# Patient Record
Sex: Male | Born: 1935
Health system: Southern US, Community
[De-identification: ages and names within clinical notes are randomized; demographics above are authoritative.]

## PROBLEM LIST (undated history)

## (undated) DIAGNOSIS — F325 Major depressive disorder, single episode, in full remission: Secondary | ICD-10-CM

## (undated) DIAGNOSIS — F329 Major depressive disorder, single episode, unspecified: Secondary | ICD-10-CM

## (undated) DIAGNOSIS — I10 Essential (primary) hypertension: Secondary | ICD-10-CM

## (undated) DIAGNOSIS — G251 Drug-induced tremor: Secondary | ICD-10-CM

## (undated) DIAGNOSIS — M1990 Unspecified osteoarthritis, unspecified site: Secondary | ICD-10-CM

## (undated) DIAGNOSIS — F32A Depression, unspecified: Secondary | ICD-10-CM

## (undated) DIAGNOSIS — Z87442 Personal history of urinary calculi: Secondary | ICD-10-CM

## (undated) DIAGNOSIS — G629 Polyneuropathy, unspecified: Secondary | ICD-10-CM

## (undated) DIAGNOSIS — Z9889 Other specified postprocedural states: Secondary | ICD-10-CM

## (undated) DIAGNOSIS — N400 Enlarged prostate without lower urinary tract symptoms: Secondary | ICD-10-CM

## (undated) HISTORY — PX: SINUS IRRIGATION: SHX2411

## (undated) HISTORY — DX: Major depressive disorder, single episode, unspecified: F32.9

## (undated) HISTORY — DX: Other specified postprocedural states: Z98.890

## (undated) HISTORY — DX: Polyneuropathy, unspecified: G62.9

## (undated) HISTORY — DX: Drug-induced tremor: G25.1

## (undated) HISTORY — DX: Major depressive disorder, single episode, in full remission: F32.5

## (undated) HISTORY — DX: Depression, unspecified: F32.A

## (undated) HISTORY — DX: Benign prostatic hyperplasia without lower urinary tract symptoms: N40.0

---

## 2006-02-20 ENCOUNTER — Encounter: Admission: RE | Admit: 2006-02-20 | Discharge: 2006-02-20 | Payer: Self-pay | Admitting: Geriatric Medicine

## 2009-01-05 ENCOUNTER — Encounter: Admission: RE | Admit: 2009-01-05 | Discharge: 2009-01-05 | Payer: Self-pay | Admitting: Neurology

## 2011-07-03 DIAGNOSIS — R972 Elevated prostate specific antigen [PSA]: Secondary | ICD-10-CM

## 2011-07-03 DIAGNOSIS — N400 Enlarged prostate without lower urinary tract symptoms: Secondary | ICD-10-CM | POA: Diagnosis not present

## 2011-07-03 DIAGNOSIS — N320 Bladder-neck obstruction: Secondary | ICD-10-CM | POA: Diagnosis not present

## 2011-07-03 DIAGNOSIS — N529 Male erectile dysfunction, unspecified: Secondary | ICD-10-CM | POA: Diagnosis not present

## 2011-07-03 HISTORY — DX: Elevated prostate specific antigen (PSA): R97.20

## 2011-08-03 DIAGNOSIS — R269 Unspecified abnormalities of gait and mobility: Secondary | ICD-10-CM | POA: Diagnosis not present

## 2011-08-03 DIAGNOSIS — D518 Other vitamin B12 deficiency anemias: Secondary | ICD-10-CM | POA: Diagnosis not present

## 2011-08-03 DIAGNOSIS — R413 Other amnesia: Secondary | ICD-10-CM | POA: Diagnosis not present

## 2011-08-16 DIAGNOSIS — F321 Major depressive disorder, single episode, moderate: Secondary | ICD-10-CM | POA: Diagnosis not present

## 2011-11-17 DIAGNOSIS — Z79899 Other long term (current) drug therapy: Secondary | ICD-10-CM | POA: Diagnosis not present

## 2011-11-17 DIAGNOSIS — I10 Essential (primary) hypertension: Secondary | ICD-10-CM | POA: Diagnosis not present

## 2011-11-17 DIAGNOSIS — E7800 Pure hypercholesterolemia, unspecified: Secondary | ICD-10-CM | POA: Diagnosis not present

## 2011-11-17 DIAGNOSIS — Z0000 Encounter for general adult medical examination without abnormal findings: Secondary | ICD-10-CM | POA: Diagnosis not present

## 2011-11-21 DIAGNOSIS — E7800 Pure hypercholesterolemia, unspecified: Secondary | ICD-10-CM | POA: Diagnosis not present

## 2011-11-21 DIAGNOSIS — Z79899 Other long term (current) drug therapy: Secondary | ICD-10-CM | POA: Diagnosis not present

## 2011-11-21 DIAGNOSIS — I10 Essential (primary) hypertension: Secondary | ICD-10-CM | POA: Diagnosis not present

## 2012-01-02 DIAGNOSIS — F339 Major depressive disorder, recurrent, unspecified: Secondary | ICD-10-CM | POA: Diagnosis not present

## 2012-01-02 DIAGNOSIS — Z79899 Other long term (current) drug therapy: Secondary | ICD-10-CM | POA: Diagnosis not present

## 2012-01-02 DIAGNOSIS — I10 Essential (primary) hypertension: Secondary | ICD-10-CM | POA: Diagnosis not present

## 2012-01-19 DIAGNOSIS — H2510 Age-related nuclear cataract, unspecified eye: Secondary | ICD-10-CM | POA: Diagnosis not present

## 2012-01-19 DIAGNOSIS — IMO0002 Reserved for concepts with insufficient information to code with codable children: Secondary | ICD-10-CM | POA: Insufficient documentation

## 2012-01-19 DIAGNOSIS — H35379 Puckering of macula, unspecified eye: Secondary | ICD-10-CM | POA: Diagnosis not present

## 2012-01-31 DIAGNOSIS — F321 Major depressive disorder, single episode, moderate: Secondary | ICD-10-CM | POA: Diagnosis not present

## 2012-02-01 DIAGNOSIS — I10 Essential (primary) hypertension: Secondary | ICD-10-CM | POA: Diagnosis not present

## 2012-02-01 DIAGNOSIS — L989 Disorder of the skin and subcutaneous tissue, unspecified: Secondary | ICD-10-CM | POA: Diagnosis not present

## 2012-02-01 DIAGNOSIS — Z79899 Other long term (current) drug therapy: Secondary | ICD-10-CM | POA: Diagnosis not present

## 2012-02-14 DIAGNOSIS — D230 Other benign neoplasm of skin of lip: Secondary | ICD-10-CM | POA: Diagnosis not present

## 2012-03-18 DIAGNOSIS — K1329 Other disturbances of oral epithelium, including tongue: Secondary | ICD-10-CM | POA: Diagnosis not present

## 2012-03-25 DIAGNOSIS — K1329 Other disturbances of oral epithelium, including tongue: Secondary | ICD-10-CM | POA: Diagnosis not present

## 2012-04-22 DIAGNOSIS — L570 Actinic keratosis: Secondary | ICD-10-CM | POA: Diagnosis not present

## 2012-04-22 DIAGNOSIS — D485 Neoplasm of uncertain behavior of skin: Secondary | ICD-10-CM | POA: Diagnosis not present

## 2012-07-01 DIAGNOSIS — N400 Enlarged prostate without lower urinary tract symptoms: Secondary | ICD-10-CM | POA: Diagnosis not present

## 2012-07-01 DIAGNOSIS — R972 Elevated prostate specific antigen [PSA]: Secondary | ICD-10-CM | POA: Diagnosis not present

## 2012-07-01 DIAGNOSIS — N320 Bladder-neck obstruction: Secondary | ICD-10-CM | POA: Diagnosis not present

## 2012-07-01 DIAGNOSIS — N529 Male erectile dysfunction, unspecified: Secondary | ICD-10-CM | POA: Diagnosis not present

## 2012-07-17 DIAGNOSIS — F321 Major depressive disorder, single episode, moderate: Secondary | ICD-10-CM | POA: Diagnosis not present

## 2012-07-19 DIAGNOSIS — F332 Major depressive disorder, recurrent severe without psychotic features: Secondary | ICD-10-CM | POA: Diagnosis not present

## 2012-08-01 DIAGNOSIS — R300 Dysuria: Secondary | ICD-10-CM | POA: Diagnosis not present

## 2012-08-01 DIAGNOSIS — I10 Essential (primary) hypertension: Secondary | ICD-10-CM | POA: Diagnosis not present

## 2012-08-01 DIAGNOSIS — E669 Obesity, unspecified: Secondary | ICD-10-CM | POA: Diagnosis not present

## 2012-08-15 DIAGNOSIS — K1329 Other disturbances of oral epithelium, including tongue: Secondary | ICD-10-CM | POA: Diagnosis not present

## 2012-09-30 DIAGNOSIS — K121 Other forms of stomatitis: Secondary | ICD-10-CM | POA: Diagnosis not present

## 2012-10-21 DIAGNOSIS — L570 Actinic keratosis: Secondary | ICD-10-CM | POA: Diagnosis not present

## 2012-10-21 DIAGNOSIS — D485 Neoplasm of uncertain behavior of skin: Secondary | ICD-10-CM | POA: Diagnosis not present

## 2012-10-31 DIAGNOSIS — I10 Essential (primary) hypertension: Secondary | ICD-10-CM | POA: Diagnosis not present

## 2012-12-30 DIAGNOSIS — R972 Elevated prostate specific antigen [PSA]: Secondary | ICD-10-CM | POA: Diagnosis not present

## 2012-12-30 DIAGNOSIS — N529 Male erectile dysfunction, unspecified: Secondary | ICD-10-CM | POA: Diagnosis not present

## 2012-12-30 DIAGNOSIS — N400 Enlarged prostate without lower urinary tract symptoms: Secondary | ICD-10-CM | POA: Diagnosis not present

## 2013-01-01 DIAGNOSIS — F321 Major depressive disorder, single episode, moderate: Secondary | ICD-10-CM | POA: Diagnosis not present

## 2013-02-06 DIAGNOSIS — E7800 Pure hypercholesterolemia, unspecified: Secondary | ICD-10-CM | POA: Diagnosis not present

## 2013-02-06 DIAGNOSIS — I10 Essential (primary) hypertension: Secondary | ICD-10-CM | POA: Diagnosis not present

## 2013-02-06 DIAGNOSIS — Z0000 Encounter for general adult medical examination without abnormal findings: Secondary | ICD-10-CM | POA: Diagnosis not present

## 2013-02-06 DIAGNOSIS — Z79899 Other long term (current) drug therapy: Secondary | ICD-10-CM | POA: Diagnosis not present

## 2013-02-06 DIAGNOSIS — Z1331 Encounter for screening for depression: Secondary | ICD-10-CM | POA: Diagnosis not present

## 2013-03-03 DIAGNOSIS — I10 Essential (primary) hypertension: Secondary | ICD-10-CM | POA: Diagnosis not present

## 2013-03-03 DIAGNOSIS — Z79899 Other long term (current) drug therapy: Secondary | ICD-10-CM | POA: Diagnosis not present

## 2013-03-03 DIAGNOSIS — E7800 Pure hypercholesterolemia, unspecified: Secondary | ICD-10-CM | POA: Diagnosis not present

## 2013-04-28 DIAGNOSIS — Z23 Encounter for immunization: Secondary | ICD-10-CM | POA: Diagnosis not present

## 2013-04-30 DIAGNOSIS — H2510 Age-related nuclear cataract, unspecified eye: Secondary | ICD-10-CM | POA: Diagnosis not present

## 2013-04-30 DIAGNOSIS — H35379 Puckering of macula, unspecified eye: Secondary | ICD-10-CM | POA: Diagnosis not present

## 2013-06-23 DIAGNOSIS — F321 Major depressive disorder, single episode, moderate: Secondary | ICD-10-CM | POA: Diagnosis not present

## 2013-06-30 DIAGNOSIS — R972 Elevated prostate specific antigen [PSA]: Secondary | ICD-10-CM | POA: Diagnosis not present

## 2013-06-30 DIAGNOSIS — N400 Enlarged prostate without lower urinary tract symptoms: Secondary | ICD-10-CM | POA: Diagnosis not present

## 2013-07-10 DIAGNOSIS — Z1211 Encounter for screening for malignant neoplasm of colon: Secondary | ICD-10-CM | POA: Diagnosis not present

## 2013-07-10 DIAGNOSIS — D126 Benign neoplasm of colon, unspecified: Secondary | ICD-10-CM | POA: Diagnosis not present

## 2013-07-10 DIAGNOSIS — K5730 Diverticulosis of large intestine without perforation or abscess without bleeding: Secondary | ICD-10-CM | POA: Diagnosis not present

## 2013-08-07 DIAGNOSIS — Z23 Encounter for immunization: Secondary | ICD-10-CM | POA: Diagnosis not present

## 2013-08-07 DIAGNOSIS — Z79899 Other long term (current) drug therapy: Secondary | ICD-10-CM | POA: Diagnosis not present

## 2013-08-07 DIAGNOSIS — I10 Essential (primary) hypertension: Secondary | ICD-10-CM | POA: Diagnosis not present

## 2013-12-08 DIAGNOSIS — F321 Major depressive disorder, single episode, moderate: Secondary | ICD-10-CM | POA: Diagnosis not present

## 2013-12-22 DIAGNOSIS — N529 Male erectile dysfunction, unspecified: Secondary | ICD-10-CM | POA: Diagnosis not present

## 2013-12-22 DIAGNOSIS — N400 Enlarged prostate without lower urinary tract symptoms: Secondary | ICD-10-CM | POA: Diagnosis not present

## 2014-02-12 DIAGNOSIS — I10 Essential (primary) hypertension: Secondary | ICD-10-CM | POA: Diagnosis not present

## 2014-02-12 DIAGNOSIS — Z6837 Body mass index (BMI) 37.0-37.9, adult: Secondary | ICD-10-CM | POA: Diagnosis not present

## 2014-02-12 DIAGNOSIS — Z1331 Encounter for screening for depression: Secondary | ICD-10-CM | POA: Diagnosis not present

## 2014-02-12 DIAGNOSIS — Z23 Encounter for immunization: Secondary | ICD-10-CM | POA: Diagnosis not present

## 2014-02-12 DIAGNOSIS — Z79899 Other long term (current) drug therapy: Secondary | ICD-10-CM | POA: Diagnosis not present

## 2014-02-12 DIAGNOSIS — Z0000 Encounter for general adult medical examination without abnormal findings: Secondary | ICD-10-CM | POA: Diagnosis not present

## 2014-02-12 DIAGNOSIS — E669 Obesity, unspecified: Secondary | ICD-10-CM | POA: Diagnosis not present

## 2014-02-19 DIAGNOSIS — Z79899 Other long term (current) drug therapy: Secondary | ICD-10-CM | POA: Diagnosis not present

## 2014-02-19 DIAGNOSIS — I10 Essential (primary) hypertension: Secondary | ICD-10-CM | POA: Diagnosis not present

## 2014-05-25 DIAGNOSIS — F321 Major depressive disorder, single episode, moderate: Secondary | ICD-10-CM | POA: Diagnosis not present

## 2014-08-13 DIAGNOSIS — I10 Essential (primary) hypertension: Secondary | ICD-10-CM | POA: Diagnosis not present

## 2014-08-13 DIAGNOSIS — Z79899 Other long term (current) drug therapy: Secondary | ICD-10-CM | POA: Diagnosis not present

## 2014-08-13 DIAGNOSIS — E669 Obesity, unspecified: Secondary | ICD-10-CM | POA: Diagnosis not present

## 2014-08-13 DIAGNOSIS — Z6837 Body mass index (BMI) 37.0-37.9, adult: Secondary | ICD-10-CM | POA: Diagnosis not present

## 2014-11-09 DIAGNOSIS — F321 Major depressive disorder, single episode, moderate: Secondary | ICD-10-CM | POA: Diagnosis not present

## 2014-12-21 DIAGNOSIS — R3915 Urgency of urination: Secondary | ICD-10-CM | POA: Diagnosis not present

## 2014-12-21 DIAGNOSIS — R972 Elevated prostate specific antigen [PSA]: Secondary | ICD-10-CM | POA: Diagnosis not present

## 2014-12-21 DIAGNOSIS — N401 Enlarged prostate with lower urinary tract symptoms: Secondary | ICD-10-CM

## 2014-12-21 DIAGNOSIS — R350 Frequency of micturition: Secondary | ICD-10-CM | POA: Diagnosis not present

## 2014-12-21 DIAGNOSIS — N528 Other male erectile dysfunction: Secondary | ICD-10-CM | POA: Diagnosis not present

## 2014-12-21 HISTORY — DX: Benign prostatic hyperplasia with lower urinary tract symptoms: N40.1

## 2015-01-27 DIAGNOSIS — H35373 Puckering of macula, bilateral: Secondary | ICD-10-CM | POA: Diagnosis not present

## 2015-01-27 DIAGNOSIS — H52203 Unspecified astigmatism, bilateral: Secondary | ICD-10-CM | POA: Diagnosis not present

## 2015-01-27 DIAGNOSIS — H2513 Age-related nuclear cataract, bilateral: Secondary | ICD-10-CM | POA: Diagnosis not present

## 2015-01-27 DIAGNOSIS — H5213 Myopia, bilateral: Secondary | ICD-10-CM | POA: Diagnosis not present

## 2015-01-28 ENCOUNTER — Ambulatory Visit
Admission: RE | Admit: 2015-01-28 | Discharge: 2015-01-28 | Disposition: A | Discharge status: Home / Self Care | Payer: Medicare Other | Source: Ambulatory Visit | Attending: Internal Medicine | Admitting: Internal Medicine

## 2015-01-28 ENCOUNTER — Other Ambulatory Visit: Payer: Self-pay | Admitting: Internal Medicine

## 2015-01-28 ENCOUNTER — Ambulatory Visit (HOSPITAL_COMMUNITY)
Admission: EM | Admit: 2015-01-28 | Discharge: 2015-01-29 | Disposition: A | Discharge status: Home / Self Care | Payer: Medicare Other | Attending: Urology | Admitting: Urology

## 2015-01-28 ENCOUNTER — Encounter (HOSPITAL_COMMUNITY)
Admission: EM | Disposition: A | Discharge status: Home / Self Care | Payer: Self-pay | Source: Home / Self Care | Attending: Emergency Medicine

## 2015-01-28 ENCOUNTER — Emergency Department (HOSPITAL_COMMUNITY): Payer: Medicare Other

## 2015-01-28 ENCOUNTER — Emergency Department (HOSPITAL_COMMUNITY): Payer: Medicare Other | Admitting: Certified Registered"

## 2015-01-28 ENCOUNTER — Encounter (HOSPITAL_COMMUNITY): Payer: Self-pay | Admitting: *Deleted

## 2015-01-28 DIAGNOSIS — N200 Calculus of kidney: Secondary | ICD-10-CM

## 2015-01-28 DIAGNOSIS — N400 Enlarged prostate without lower urinary tract symptoms: Secondary | ICD-10-CM | POA: Diagnosis not present

## 2015-01-28 DIAGNOSIS — R109 Unspecified abdominal pain: Secondary | ICD-10-CM | POA: Diagnosis not present

## 2015-01-28 DIAGNOSIS — Z79899 Other long term (current) drug therapy: Secondary | ICD-10-CM | POA: Insufficient documentation

## 2015-01-28 DIAGNOSIS — X58XXXA Exposure to other specified factors, initial encounter: Secondary | ICD-10-CM | POA: Diagnosis not present

## 2015-01-28 DIAGNOSIS — N201 Calculus of ureter: Secondary | ICD-10-CM | POA: Diagnosis present

## 2015-01-28 DIAGNOSIS — K760 Fatty (change of) liver, not elsewhere classified: Secondary | ICD-10-CM | POA: Diagnosis not present

## 2015-01-28 DIAGNOSIS — K5900 Constipation, unspecified: Secondary | ICD-10-CM | POA: Insufficient documentation

## 2015-01-28 DIAGNOSIS — S3710XA Unspecified injury of ureter, initial encounter: Secondary | ICD-10-CM | POA: Diagnosis not present

## 2015-01-28 DIAGNOSIS — R944 Abnormal results of kidney function studies: Secondary | ICD-10-CM | POA: Insufficient documentation

## 2015-01-28 DIAGNOSIS — S3719XA Other injury of ureter, initial encounter: Secondary | ICD-10-CM | POA: Insufficient documentation

## 2015-01-28 DIAGNOSIS — R1032 Left lower quadrant pain: Secondary | ICD-10-CM | POA: Diagnosis not present

## 2015-01-28 DIAGNOSIS — N3289 Other specified disorders of bladder: Secondary | ICD-10-CM | POA: Diagnosis not present

## 2015-01-28 DIAGNOSIS — I10 Essential (primary) hypertension: Secondary | ICD-10-CM | POA: Diagnosis not present

## 2015-01-28 HISTORY — DX: Calculus of ureter: N20.1

## 2015-01-28 HISTORY — DX: Essential (primary) hypertension: I10

## 2015-01-28 HISTORY — PX: CYSTOSCOPY WITH RETROGRADE PYELOGRAM, URETEROSCOPY AND STENT PLACEMENT: SHX5789

## 2015-01-28 LAB — COMPREHENSIVE METABOLIC PANEL
ALT: 28 U/L (ref 17–63)
AST: 23 U/L (ref 15–41)
Albumin: 4.5 g/dL (ref 3.5–5.0)
Alkaline Phosphatase: 55 U/L (ref 38–126)
Anion gap: 10 (ref 5–15)
BUN: 19 mg/dL (ref 6–20)
CALCIUM: 9.7 mg/dL (ref 8.9–10.3)
CO2: 24 mmol/L (ref 22–32)
CREATININE: 1.52 mg/dL — AB (ref 0.61–1.24)
Chloride: 103 mmol/L (ref 101–111)
GFR calc non Af Amer: 42 mL/min — ABNORMAL LOW (ref >60)
GFR, EST AFRICAN AMERICAN: 48 mL/min — AB (ref >60)
GLUCOSE: 126 mg/dL — AB (ref 65–99)
POTASSIUM: 4.8 mmol/L (ref 3.5–5.1)
SODIUM: 137 mmol/L (ref 135–145)
TOTAL PROTEIN: 7.3 g/dL (ref 6.5–8.1)
Total Bilirubin: 0.8 mg/dL (ref 0.3–1.2)

## 2015-01-28 LAB — CBC
HEMATOCRIT: 45.3 % (ref 39.0–52.0)
HEMOGLOBIN: 15.2 g/dL (ref 13.0–17.0)
MCH: 30 pg (ref 26.0–34.0)
MCHC: 33.6 g/dL (ref 30.0–36.0)
MCV: 89.5 fL (ref 78.0–100.0)
Platelets: 255 10*3/uL (ref 150–400)
RBC: 5.06 MIL/uL (ref 4.22–5.81)
RDW: 13.5 % (ref 11.5–15.5)
WBC: 12.8 10*3/uL — AB (ref 4.0–10.5)

## 2015-01-28 LAB — URINALYSIS, ROUTINE W REFLEX MICROSCOPIC
BILIRUBIN URINE: NEGATIVE
Glucose, UA: NEGATIVE mg/dL
KETONES UR: NEGATIVE mg/dL
LEUKOCYTES UA: NEGATIVE
NITRITE: NEGATIVE
PH: 7.5 (ref 5.0–8.0)
Protein, ur: NEGATIVE mg/dL
Specific Gravity, Urine: 1.01 (ref 1.005–1.030)
UROBILINOGEN UA: 0.2 mg/dL (ref 0.0–1.0)

## 2015-01-28 LAB — LIPASE, BLOOD: LIPASE: 29 U/L (ref 22–51)

## 2015-01-28 LAB — URINE MICROSCOPIC-ADD ON

## 2015-01-28 LAB — I-STAT CG4 LACTIC ACID, ED: LACTIC ACID, VENOUS: 1.5 mmol/L (ref 0.5–2.0)

## 2015-01-28 SURGERY — CYSTOURETEROSCOPY, WITH RETROGRADE PYELOGRAM AND STENT INSERTION
Anesthesia: General | Site: Ureter | Laterality: Left

## 2015-01-28 MED ORDER — CEFAZOLIN SODIUM-DEXTROSE 2-3 GM-% IV SOLR
INTRAVENOUS | Status: DC | PRN
  Administered 2015-01-28: 2 g via INTRAVENOUS

## 2015-01-28 MED ORDER — EPINEPHRINE HCL 0.1 MG/ML IJ SOSY
PREFILLED_SYRINGE | INTRAMUSCULAR | Status: DC | PRN
  Administered 2015-01-28: 10 ug via INTRAVENOUS

## 2015-01-28 MED ORDER — MORPHINE SULFATE 4 MG/ML IJ SOLN
4.0000 mg | Freq: Once | INTRAMUSCULAR | Status: AC
  Administered 2015-01-28: 4 mg via INTRAVENOUS
  Filled 2015-01-28: qty 1

## 2015-01-28 MED ORDER — TAMSULOSIN HCL 0.4 MG PO CAPS
0.4000 mg | ORAL_CAPSULE | Freq: Every day | ORAL | Status: DC
  Filled 2015-01-28: qty 1

## 2015-01-28 MED ORDER — FENTANYL CITRATE (PF) 100 MCG/2ML IJ SOLN
INTRAMUSCULAR | Status: DC | PRN
  Administered 2015-01-28 (×2): 50 ug via INTRAVENOUS

## 2015-01-28 MED ORDER — SUCCINYLCHOLINE CHLORIDE 20 MG/ML IJ SOLN
INTRAMUSCULAR | Status: DC | PRN
  Administered 2015-01-28: 100 mg via INTRAVENOUS

## 2015-01-28 MED ORDER — ACETAMINOPHEN 10 MG/ML IV SOLN
INTRAVENOUS | Status: DC | PRN
  Administered 2015-01-28: 1000 mg via INTRAVENOUS

## 2015-01-28 MED ORDER — IOHEXOL 300 MG/ML  SOLN
INTRAMUSCULAR | Status: DC | PRN
  Administered 2015-01-28: 1 mL via INTRAVENOUS

## 2015-01-28 MED ORDER — LACTATED RINGERS IV SOLN
INTRAVENOUS | Status: DC | PRN
  Administered 2015-01-28: 23:00:00 via INTRAVENOUS

## 2015-01-28 MED ORDER — FINASTERIDE 5 MG PO TABS
5.0000 mg | ORAL_TABLET | Freq: Every day | ORAL | Status: DC
  Filled 2015-01-28: qty 1

## 2015-01-28 MED ORDER — LIDOCAINE HCL (PF) 2 % IJ SOLN
INTRAMUSCULAR | Status: DC | PRN
  Administered 2015-01-28: 20 mg via INTRADERMAL

## 2015-01-28 MED ORDER — ONDANSETRON HCL 4 MG/2ML IJ SOLN
4.0000 mg | Freq: Once | INTRAMUSCULAR | Status: AC
  Administered 2015-01-28: 4 mg via INTRAVENOUS
  Filled 2015-01-28: qty 2

## 2015-01-28 MED ORDER — DEXAMETHASONE SODIUM PHOSPHATE 10 MG/ML IJ SOLN
INTRAMUSCULAR | Status: DC | PRN
  Administered 2015-01-28: 10 mg via INTRAVENOUS

## 2015-01-28 MED ORDER — SODIUM CHLORIDE 0.9 % IV BOLUS (SEPSIS): 1000.0000 mL | Freq: Once | INTRAVENOUS | Status: DC

## 2015-01-28 MED ORDER — METOCLOPRAMIDE HCL 5 MG/ML IJ SOLN
INTRAMUSCULAR | Status: DC | PRN
  Administered 2015-01-28: 10 mg via INTRAVENOUS

## 2015-01-28 MED ORDER — BELLADONNA ALKALOIDS-OPIUM 16.2-60 MG RE SUPP
RECTAL | Status: DC | PRN
  Administered 2015-01-28: 1 via RECTAL

## 2015-01-28 MED ORDER — PHENYLEPHRINE HCL 10 MG/ML IJ SOLN
INTRAMUSCULAR | Status: DC | PRN
  Administered 2015-01-28 (×3): 80 ug via INTRAVENOUS

## 2015-01-28 MED ORDER — DEXTROSE 5 % IV SOLN
10.0000 mg | INTRAVENOUS | Status: DC | PRN
  Administered 2015-01-28: 40 ug/min via INTRAVENOUS

## 2015-01-28 MED ORDER — EPHEDRINE SULFATE 50 MG/ML IJ SOLN
INTRAMUSCULAR | Status: DC | PRN
  Administered 2015-01-28 (×5): 10 mg via INTRAVENOUS

## 2015-01-28 MED ORDER — FENTANYL CITRATE (PF) 100 MCG/2ML IJ SOLN: 25.0000 ug | INTRAMUSCULAR | Status: DC | PRN

## 2015-01-28 MED ORDER — IOHEXOL 300 MG/ML  SOLN
25.0000 mL | INTRAMUSCULAR | Status: AC
  Administered 2015-01-28: 25 mL via ORAL

## 2015-01-28 MED ORDER — LACTATED RINGERS IV SOLN
INTRAVENOUS | Status: DC | PRN
  Administered 2015-01-28 (×2): via INTRAVENOUS

## 2015-01-28 MED ORDER — PROMETHAZINE HCL 25 MG/ML IJ SOLN: 6.2500 mg | INTRAMUSCULAR | Status: DC | PRN

## 2015-01-28 MED ORDER — IOHEXOL 300 MG/ML  SOLN
80.0000 mL | Freq: Once | INTRAMUSCULAR | Status: AC | PRN
  Administered 2015-01-28: 80 mL via INTRAVENOUS

## 2015-01-28 MED ORDER — PROPOFOL 10 MG/ML IV BOLUS
INTRAVENOUS | Status: DC | PRN
  Administered 2015-01-28: 150 mg via INTRAVENOUS

## 2015-01-28 MED ORDER — ONDANSETRON HCL 4 MG/2ML IJ SOLN
INTRAMUSCULAR | Status: DC | PRN
  Administered 2015-01-28: 4 mg via INTRAVENOUS

## 2015-01-28 SURGICAL SUPPLY — 24 items
BAG URINE DRAINAGE (UROLOGICAL SUPPLIES) ×2 IMPLANT
BAG URO CATCHER STRL LF (DRAPE) ×3 IMPLANT
BASKET ZERO TIP NITINOL 2.4FR (BASKET) ×2 IMPLANT
BSKT STON RTRVL ZERO TP 2.4FR (BASKET) ×2
CATH INTERMIT  6FR 70CM (CATHETERS) ×3 IMPLANT
CATH SILASTIC FOLEY 18FRX5CC (CATHETERS) ×2 IMPLANT
CLOTH BEACON ORANGE TIMEOUT ST (SAFETY) ×3 IMPLANT
COVER SURGICAL LIGHT HANDLE (MISCELLANEOUS) ×2 IMPLANT
FIBER LASER FLEXIVA 1000 (UROLOGICAL SUPPLIES) IMPLANT
FIBER LASER FLEXIVA 200 (UROLOGICAL SUPPLIES) IMPLANT
FIBER LASER FLEXIVA 365 (UROLOGICAL SUPPLIES) IMPLANT
FIBER LASER FLEXIVA 550 (UROLOGICAL SUPPLIES) IMPLANT
FIBER LASER TRAC TIP (UROLOGICAL SUPPLIES) IMPLANT
GLOVE BIOGEL M STRL SZ7.5 (GLOVE) ×3 IMPLANT
GOWN STRL REUS W/TWL XL LVL3 (GOWN DISPOSABLE) ×3 IMPLANT
GUIDEWIRE ANG ZIPWIRE 035X150 (WIRE) ×4 IMPLANT
GUIDEWIRE STR DUAL SENSOR (WIRE) ×3 IMPLANT
MANIFOLD NEPTUNE II (INSTRUMENTS) ×3 IMPLANT
NS IRRIG 1000ML POUR BTL (IV SOLUTION) ×3 IMPLANT
PACK CYSTO (CUSTOM PROCEDURE TRAY) ×3 IMPLANT
SCRUB PCMX 4 OZ (MISCELLANEOUS) ×3 IMPLANT
STENT URET 6FRX24 CONTOUR (STENTS) ×2 IMPLANT
TUBING CONNECTING 10 (TUBING) ×3 IMPLANT
WIRE COONS/BENSON .038X145CM (WIRE) ×2 IMPLANT

## 2015-01-28 NOTE — Interval H&P Note (Signed)
History and Physical Interval Note:  01/28/2015 11:07 PM  Chase Mathis  has presented today for surgery, with the diagnosis of left ureteral stone  The various methods of treatment have been discussed with the patient and family. After consideration of risks, benefits and other options for treatment, the patient has consented to  Procedure(s): CYSTOSCOPY WITH LEFT RETROGRADE PYELOGRAM, LEFT URETEROSCOPY/STONE BASKETING  AND LEFT URETERAL  STENT PLACEMENT (Left) HOLMIUM LASER APPLICATION (Left) as a surgical intervention .  The patient's history has been reviewed, patient examined, no change in status, stable for surgery.  I have reviewed the patient's chart and labs.  Questions were answered to the patient's satisfaction.     Chase Mathis I Dilyn Smiles

## 2015-01-28 NOTE — ED Provider Notes (Signed)
CSN: [id number]     Arrival date & time 01/28/15  1646 History   First MD Initiated Contact with Patient 01/28/15 1728     Chief Complaint  Patient presents with  . 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 The pain has been constant all day waxing and waning in severity. Nothing in particular makes it better or worse. He took a suppository and docolax for constipation but did not have any relief. Reports last normal bowel movement 2 days ago. Has had some small bowel movements since then. He denies passing any gas. He saw his PCP today and had an x-ray that showed ileus versus obstruction. He has no previous abdominal surgeries. His only medical problem is high blood pressure. He denies any previous kidney stones. No pain with urination or blood in the urine. Denies fever. Poor appetite today. He's had nausea but no vomiting. The pain is in his suprapubic area and is constant and does not radiate. No testicular pain.  The history is provided by the patient and a relative.    Past Medical History  Diagnosis Date  . Hypertension    History reviewed. No pertinent past surgical history. History reviewed. No pertinent family history. History  Substance Use Topics  . Smoking status: Never Smoker   . Smokeless tobacco: Not on file  . Alcohol Use: Yes     Comment: occasionally    Review of Systems  Constitutional: Positive for activity change and appetite change. Negative for fever and fatigue.  HENT: Negative for congestion and rhinorrhea.   Respiratory: Negative for cough, chest tightness and shortness of breath.   Cardiovascular: Negative for chest pain and leg swelling.  Gastrointestinal: Positive for nausea, abdominal pain and constipation. Negative for vomiting.  Genitourinary:  Negative for dysuria and hematuria.  Musculoskeletal: Negative for myalgias, arthralgias, neck pain and neck stiffness.  Skin: Negative for rash.  A complete 10 system review of systems was obtained and all systems are negative except as noted in the HPI and PMH.      Allergies  Review of patient's allergies indicates no known allergies.  Home Medications   Prior to Admission medications   Medication Sig Start Date End Date Taking? Authorizing Provider  amLODipine (NORVASC) 5 MG tablet Take 5 mg by mouth daily.   Yes Historical Provider, MD  cyanocobalamin (,VITAMIN B-12,) 1000 MCG/ML injection Inject 1,000 mcg into the muscle every 30 (thirty) days.   Yes Historical Provider, MD  lisinopril (PRINIVIL,ZESTRIL) 10 MG tablet Take 10 mg by mouth daily.   Yes Historical Provider, MD  lithium carbonate (ESKALITH) 450 MG CR tablet Take 450 mg by mouth at bedtime.   Yes Historical Provider, MD  nortriptyline (PAMELOR) 50 MG capsule Take 150 mg by mouth at bedtime.   Yes Historical Provider, MD  Stannous Fluoride (GEL-KAM) 0.4 % GEL Place 1-2 application onto teeth daily.   Yes Historical Provider, MD   BP 135/60 mmHg  Pulse 64  Temp(Src) 97.7 F (36.5 C) (Oral)  Resp 16  Ht 5\' 9"  (1.753 m)  Wt 220 lb (99.791 kg)  BMI 32.47 kg/m2  SpO2 98% Physical Exam  Constitutional: He is oriented to person, place, and time. He appears well-developed and well-nourished. No distress.  No distress, easily getting on and off stretcher.  HENT:  Head: Normocephalic and atraumatic.  Mouth/Throat: Oropharynx is  clear and moist. No oropharyngeal exudate.  Eyes: Conjunctivae and EOM are normal. Pupils are equal, round, and reactive to light.  Neck: Normal range of motion. Neck supple.  No meningismus.  Cardiovascular: Normal rate, regular rhythm, normal heart sounds and intact distal pulses.   No murmur heard. Pulmonary/Chest: Effort normal and breath sounds normal. No respiratory distress.  Abdominal:  Soft. He exhibits distension. There is tenderness. There is no rebound and no guarding.  Tenderness suprapubically without guarding or rebound. Abdomen is distended but no worse than usual by  report. Patient does have a ventral hernia evident when he sits up that is nontender.  Genitourinary:  No testicular tenderness. Patient states rectal exam performed at PCPs office was normal. He defers repeat exam.  Musculoskeletal: Normal range of motion. He exhibits no edema or tenderness.  Neurological: He is alert and oriented to person, place, and time. No cranial nerve deficit. He exhibits normal muscle tone. Coordination normal.  No ataxia on finger to nose bilaterally. No pronator drift. 5/5 strength throughout. CN 2-12 intact. Negative Romberg. Equal grip strength. Sensation intact. Gait is normal.   Skin: Skin is warm.  Psychiatric: He has a normal mood and affect. His behavior is normal.  Nursing note and vitals reviewed.   ED Course  Procedures (including critical care time) Labs Review Labs Reviewed  COMPREHENSIVE METABOLIC PANEL - Abnormal; Notable for the following:    Glucose, Bld 126 (*)    Creatinine, Ser 1.52 (*)    GFR calc non Af Amer 42 (*)    GFR calc Af Amer 48 (*)    All other components within normal limits  CBC - Abnormal; Notable for the following:    WBC 12.8 (*)    All other components within normal limits  URINALYSIS, ROUTINE W REFLEX MICROSCOPIC (NOT AT [facility]) - Abnormal; Notable for the following:    Hgb urine dipstick MODERATE (*)    All other components within normal limits  LIPASE, BLOOD  URINE MICROSCOPIC-ADD ON  BASIC METABOLIC PANEL  I-STAT CG4 LACTIC ACID, ED  POC OCCULT BLOOD, ED  I-STAT CG4 LACTIC ACID, ED    Imaging Review Ct Abdomen Pelvis W Contrast  01/28/2015   CLINICAL DATA:  Mid and lower abdominal pain which had the intermittent but is now constant. Initial encounter.  EXAM: CT ABDOMEN AND PELVIS WITH CONTRAST  TECHNIQUE: Multidetector CT  imaging of the abdomen and pelvis was performed using the standard protocol following bolus administration of intravenous contrast.  CONTRAST:  80 mL OMNIPAQUE IOHEXOL 300 MG/ML  SOLN  COMPARISON:  Plain films of the abdomen earlier today.  FINDINGS: Dependent atelectasis is seen in the lung bases. Calcific coronary artery and aortic atherosclerosis is noted. No pleural or pericardial effusion.  There is mild left hydronephrosis with stranding about the left kidney and ureter the due to a punctate stone at the left UVJ. Urine is identified in the left retroperitoneal space along the psoas muscle. On delayed imaging, contrast extravasates out of the left renal pelvis or proximal left ureter into the retroperitoneal space. Three left renal cysts are noted. The right kidney appears normal. The urinary bladder is distended but otherwise unremarkable. The prostate gland is markedly enlarged.  A few small cysts are seen scattered in the liver. The liver is otherwise unremarkable. The gallbladder, adrenal glands and pancreas appear normal. Sigmoid diverticulosis without diverticulitis is noted. Large volume of stool in the ascending colon is identified. The small bowel is unremarkable. There is no  ileus.  No lytic or sclerotic bony lesion is identified. Autologous L5-S1 fusion is noted. The patient has transitional anatomy at the lumbosacral junction.  IMPRESSION: Extravasation of urine into the retroperitoneum either from the left renal pelvis or proximal left ureter. Punctate stone is identified at the UVJ but there is no left hydronephrosis as the left kidney is likely decompressed by the leakage of urine.  Marked prostatomegaly. The urinary bladder is markedly distended likely due to outlet obstruction.  Fatty infiltration of liver.  Critical Value/emergent results were called by telephone at the time of interpretation on 01/28/2015 at 7:20 pm to Dr. Ezequiel Essex , who verbally acknowledged these results.    Electronically Signed   By: Inge Rise M.D.   On: 01/28/2015 19:22   Dg Abd 2 Views  01/28/2015   CLINICAL DATA:  Abdominal pain.  EXAM: ABDOMEN - 2 VIEW  COMPARISON:  None.  FINDINGS: Soft tissue structures are unremarkable. Dilated loops of small and large bowel noted. Findings are most consistent with adynamic ileus. Follow-up abdominal series suggested to demonstrate clearing and to exclude bowel obstruction. No free air. Stool noted throughout the colon. No pathologic intra-abdominal calcifications. No acute bony abnormality.  IMPRESSION: Dilated loops of small and large bowel most consistent with adynamic ileus. Follow-up abdominal series is suggested to exclude developing obstruction. No free air. Stool noted throughout the colon.   Electronically Signed   By: Marcello Moores  Register   On: 01/28/2015 14:43     EKG Interpretation None      MDM   Final diagnoses:  Ureter injury, initial encounter  Kidney stone   One day of lower abdominal pain with obstipation. X-ray concerning for ileus.  Urine shows hematuria without infection. Hx BPH, denies hx of kidney stone.  Elevated creatinine discussed with radiology tech. They state reduced dose contrast is appropriate.  CT shows punctate UVJ stone as well as extravasated urine in the retroperitoneum concerning for ureteral rupture.  Discussed with Dr. Gaynelle Arabian who will evaluate patient. He has seen patient and recommended transfer to [facility] for surgery tonight.  Patient and family updated, including his son Dr. Vickey Huger.  He is stable for transfer.   Ezequiel Essex, MD 01/29/15 [phone]

## 2015-01-28 NOTE — ED Notes (Signed)
Pt reports intermittent and now constant abdominal pain. Denies N/V. Pt reports taking suppository for possible constipation with no relief. Pt was seen by his PCP. Pt was told that he has an ilius after xrays.

## 2015-01-28 NOTE — Consult Note (Signed)
Urology Consult  Referring physician:   Dr. 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 Pt awokwe wit abdominal pain at 2 Am, and had progressive low abdominal pain, lateralizing to the LLQ and L inguinal canal this pm. Pt last ate at 2 pm ( 1/2 bowl soup). He had labs in [facility], showing Cr 1.5. He had ER evaluation for abdominal pain, and CT with reduced IV contrast protocol, with dx L U-V junction stone, 73mm, with severe L ureteral obstruction, with retroperitoneal rupture.   Past Medical History  Diagnosis Date  . Hypertension    History reviewed. No pertinent past surgical history.  Medications: I have reviewed the patient's current medications.  Allergies: No Known Allergies  No family history on file.  Social History:  reports that he has never smoked. He does not have any smokeless tobacco history on file. He reports that he drinks alcohol. He reports that he does not use illicit drugs.  ROS: Abdominal pain, nausea. No vomiting. No gross hematuria. No CVA pain.   Physical Exam:  Vital signs in last 24 hours: Temp:  [97.8 F (36.6 C)] 97.8 F (36.6 C) (08/04 1701) Pulse Rate:  [69-83] 69 (08/04 1830) Resp:  [18-20] 20 (08/04 1818) BP: (168-175)/(66-68) 175/68 mmHg (08/04 1830) SpO2:  [96 %-97 %] 97 % (08/04 1830) Physical Exam: Abdominal: obese. No CVA pain. Testes wnl. No hernia. Chest: clear to P/A.  Cor: S1S2 wnl. No murmur.   Laboratory Data:  Results for orders placed or performed during the hospital encounter of 01/28/15 (from the past 72 hour(s))  Lipase, blood     Status: None   Collection Time: 01/28/15  5:14 PM  Result Value Ref Range   Lipase 29 22 - 51 U/L  Comprehensive metabolic panel     Status: Abnormal   Collection Time: 01/28/15  5:14 PM  Result Value Ref Range   Sodium 137 135 - 145 mmol/L   Potassium 4.8 3.5 -  5.1 mmol/L   Chloride 103 101 - 111 mmol/L   CO2 24 22 - 32 mmol/L   Glucose, Bld 126 (H) 65 - 99 mg/dL   BUN 19 6 - 20 mg/dL   Creatinine, Ser 1.52 (H) 0.61 - 1.24 mg/dL   Calcium 9.7 8.9 - 10.3 mg/dL   Total Protein 7.3 6.5 - 8.1 g/dL   Albumin 4.5 3.5 - 5.0 g/dL   AST 23 15 - 41 U/L   ALT 28 17 - 63 U/L   Alkaline Phosphatase 55 38 - 126 U/L   Total Bilirubin 0.8 0.3 - 1.2 mg/dL   GFR calc non Af Amer 42 (L) >60 mL/min   GFR calc Af Amer 48 (L) >60 mL/min    Comment: (NOTE) The eGFR has been calculated using the CKD EPI equation. This calculation has not been validated in all clinical situations. eGFR's persistently <60 mL/min signify possible Chronic Kidney Disease.    Anion gap 10 5 - 15  CBC     Status: Abnormal   Collection Time: 01/28/15  5:14 PM  Result Value Ref Range   WBC 12.8 (H) 4.0 - 10.5 K/uL   RBC 5.06 4.22 - 5.81 MIL/uL   Hemoglobin 15.2 13.0 - 17.0 g/dL   HCT 45.3 39.0 - 52.0 %   MCV 89.5 78.0 - 100.0 fL   MCH 30.0 26.0 - 34.0  pg   MCHC 33.6 30.0 - 36.0 g/dL   RDW 13.5 11.5 - 15.5 %   Platelets 255 150 - 400 K/uL  I-Stat CG4 Lactic Acid, ED     Status: None   Collection Time: 01/28/15  5:45 PM  Result Value Ref Range   Lactic Acid, Venous 1.50 0.5 - 2.0 mmol/L  Urinalysis, Routine w reflex microscopic (not at [facility])     Status: Abnormal   Collection Time: 01/28/15  7:33 PM  Result Value Ref Range   Color, Urine YELLOW YELLOW   APPearance CLEAR CLEAR   Specific Gravity, Urine 1.010 1.005 - 1.030   pH 7.5 5.0 - 8.0   Glucose, UA NEGATIVE NEGATIVE mg/dL   Hgb urine dipstick MODERATE (A) NEGATIVE   Bilirubin Urine NEGATIVE NEGATIVE   Ketones, ur NEGATIVE NEGATIVE mg/dL   Protein, ur NEGATIVE NEGATIVE mg/dL   Urobilinogen, UA 0.2 0.0 - 1.0 mg/dL   Nitrite NEGATIVE NEGATIVE   Leukocytes, UA NEGATIVE NEGATIVE  Urine microscopic-add on     Status: None   Collection Time: 01/28/15  7:33 PM  Result Value Ref Range   RBC / HPF 3-6 <3 RBC/hpf   No  results found for this or any previous visit (from the past 240 hour(s)). Creatinine:  Recent Labs  01/28/15 1714  CREATININE 1.52*   Baseline Creatinine: < 1.0  Impression/Assessment:  L U-V junction stone with obstruction, and rupture. Discussed possibilities witjh pt and family. Because of severe obstruction, and because of elevated creatinine, I have advised  Him to be transferred to [facility] for Urologic surgery tonight.   Plan:  [facility] to [facility] for surgery now.  Cysto, L retrograde pyelogram, ureteroscopy, basket extraction and JJ stent ( 107F x 24cm).,   Chase Mathis I Chayce Robbins 01/28/2015, 8:15 PM

## 2015-01-28 NOTE — H&P (View-Only) (Signed)
Urology Consult  Referring physician:   Dr. 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 Pt awokwe wit abdominal pain at 2 Am, and had progressive low abdominal pain, lateralizing to the LLQ and L inguinal canal this pm. Pt last ate at 2 pm ( 1/2 bowl soup). He had labs in [facility], showing Cr 1.5. He had ER evaluation for abdominal pain, and CT with reduced IV contrast protocol, with dx L U-V junction stone, 34mm, with severe L ureteral obstruction, with retroperitoneal rupture.   Past Medical History  Diagnosis Date  . Hypertension    History reviewed. No pertinent past surgical history.  Medications: I have reviewed the patient's current medications.  Allergies: No Known Allergies  No family history on file.  Social History:  reports that he has never smoked. He does not have any smokeless tobacco history on file. He reports that he drinks alcohol. He reports that he does not use illicit drugs.  ROS: Abdominal pain, nausea. No vomiting. No gross hematuria. No CVA pain.   Physical Exam:  Vital signs in last 24 hours: Temp:  [97.8 F (36.6 C)] 97.8 F (36.6 C) (08/04 1701) Pulse Rate:  [69-83] 69 (08/04 1830) Resp:  [18-20] 20 (08/04 1818) BP: (168-175)/(66-68) 175/68 mmHg (08/04 1830) SpO2:  [96 %-97 %] 97 % (08/04 1830) Physical Exam: Abdominal: obese. No CVA pain. Testes wnl. No hernia. Chest: clear to P/A.  Cor: S1S2 wnl. No murmur.   Laboratory Data:  Results for orders placed or performed during the hospital encounter of 01/28/15 (from the past 72 hour(s))  Lipase, blood     Status: None   Collection Time: 01/28/15  5:14 PM  Result Value Ref Range   Lipase 29 22 - 51 U/L  Comprehensive metabolic panel     Status: Abnormal   Collection Time: 01/28/15  5:14 PM  Result Value Ref Range   Sodium 137 135 - 145 mmol/L   Potassium 4.8 3.5 -  5.1 mmol/L   Chloride 103 101 - 111 mmol/L   CO2 24 22 - 32 mmol/L   Glucose, Bld 126 (H) 65 - 99 mg/dL   BUN 19 6 - 20 mg/dL   Creatinine, Ser 1.52 (H) 0.61 - 1.24 mg/dL   Calcium 9.7 8.9 - 10.3 mg/dL   Total Protein 7.3 6.5 - 8.1 g/dL   Albumin 4.5 3.5 - 5.0 g/dL   AST 23 15 - 41 U/L   ALT 28 17 - 63 U/L   Alkaline Phosphatase 55 38 - 126 U/L   Total Bilirubin 0.8 0.3 - 1.2 mg/dL   GFR calc non Af Amer 42 (L) >60 mL/min   GFR calc Af Amer 48 (L) >60 mL/min    Comment: (NOTE) The eGFR has been calculated using the CKD EPI equation. This calculation has not been validated in all clinical situations. eGFR's persistently <60 mL/min signify possible Chronic Kidney Disease.    Anion gap 10 5 - 15  CBC     Status: Abnormal   Collection Time: 01/28/15  5:14 PM  Result Value Ref Range   WBC 12.8 (H) 4.0 - 10.5 K/uL   RBC 5.06 4.22 - 5.81 MIL/uL   Hemoglobin 15.2 13.0 - 17.0 g/dL   HCT 45.3 39.0 - 52.0 %   MCV 89.5 78.0 - 100.0 fL   MCH 30.0 26.0 - 34.0  pg   MCHC 33.6 30.0 - 36.0 g/dL   RDW 13.5 11.5 - 15.5 %   Platelets 255 150 - 400 K/uL  I-Stat CG4 Lactic Acid, ED     Status: None   Collection Time: 01/28/15  5:45 PM  Result Value Ref Range   Lactic Acid, Venous 1.50 0.5 - 2.0 mmol/L  Urinalysis, Routine w reflex microscopic (not at [facility])     Status: Abnormal   Collection Time: 01/28/15  7:33 PM  Result Value Ref Range   Color, Urine YELLOW YELLOW   APPearance CLEAR CLEAR   Specific Gravity, Urine 1.010 1.005 - 1.030   pH 7.5 5.0 - 8.0   Glucose, UA NEGATIVE NEGATIVE mg/dL   Hgb urine dipstick MODERATE (A) NEGATIVE   Bilirubin Urine NEGATIVE NEGATIVE   Ketones, ur NEGATIVE NEGATIVE mg/dL   Protein, ur NEGATIVE NEGATIVE mg/dL   Urobilinogen, UA 0.2 0.0 - 1.0 mg/dL   Nitrite NEGATIVE NEGATIVE   Leukocytes, UA NEGATIVE NEGATIVE  Urine microscopic-add on     Status: None   Collection Time: 01/28/15  7:33 PM  Result Value Ref Range   RBC / HPF 3-6 <3 RBC/hpf   No  results found for this or any previous visit (from the past 240 hour(s)). Creatinine:  Recent Labs  01/28/15 1714  CREATININE 1.52*   Baseline Creatinine: < 1.0  Impression/Assessment:  L U-V junction stone with obstruction, and rupture. Discussed possibilities witjh pt and family. Because of severe obstruction, and because of elevated creatinine, I have advised  Him to be transferred to [facility] for Urologic surgery tonight.   Plan:  [facility] to [facility] for surgery now.  Cysto, L retrograde pyelogram, ureteroscopy, basket extraction and JJ stent ( 107F x 24cm).,   Aziyah Provencal I Telisha Zawadzki 01/28/2015, 8:15 PM

## 2015-01-28 NOTE — Transfer of Care (Signed)
Immediate Anesthesia Transfer of Care Note  Patient: Chase Mathis  Procedure(s) Performed: Procedure(s): CYSTOSCOPY WITH LEFT RETROGRADE PYELOGRAM, LEFT URETEROSCOPY/STONE BASKETING  AND LEFT URETERAL  STENT PLACEMENT (Left) HOLMIUM LASER APPLICATION (Left)  Patient Location: PACU  Anesthesia Type:General  Level of Consciousness:  sedated, patient cooperative and responds to stimulation  Airway & Oxygen Therapy:Patient Spontanous Breathing and Patient connected to face mask oxgen  Post-op Assessment:  Report given to PACU RN and Post -op Vital signs reviewed and stable  Post vital signs:  Reviewed and stable  Last Vitals:  Filed Vitals:   01/28/15 2020  BP: 161/68  Pulse: 69  Temp: 37 C  Resp: 16    Complications: No apparent anesthesia complications

## 2015-01-28 NOTE — Anesthesia Procedure Notes (Signed)
Procedure Name: Intubation Date/Time: 01/28/2015 9:20 PM Performed by: Lajuana Carry E Pre-anesthesia Checklist: Patient identified, Emergency Drugs available, Suction available and Patient being monitored Patient Re-evaluated:Patient Re-evaluated prior to inductionOxygen Delivery Method: Circle System Utilized Preoxygenation: Pre-oxygenation with 100% oxygen Intubation Type: IV induction and Cricoid Pressure applied Ventilation: Mask ventilation without difficulty Laryngoscope Size: Miller, 3 and Glidescope Grade View: Grade I Tube type: Oral Number of attempts: 2 Airway Equipment and Method: Stylet and Oral airway Placement Confirmation: ETT inserted through vocal cords under direct vision,  positive ETCO2 and breath sounds checked- equal and bilateral Secured at: 21 cm Tube secured with: Tape Dental Injury: Teeth and Oropharynx as per pre-operative assessment  Comments: Attempt w/Miller 3, easy elective Glidescope used, atraumatic intubation

## 2015-01-28 NOTE — Anesthesia Preprocedure Evaluation (Addendum)
Anesthesia Evaluation  Patient identified by MRN, date of birth, ID band Patient awake    Reviewed: Allergy & Precautions, NPO status , Patient's Chart, lab work & pertinent test results  Airway Mallampati: II  TM Distance: >3 FB Neck ROM: Full    Dental no notable dental hx.    Pulmonary neg pulmonary ROS,  breath sounds clear to auscultation  Pulmonary exam normal       Cardiovascular hypertension, Pt. on medications Normal cardiovascular examRhythm:Regular Rate:Normal     Neuro/Psych negative neurological ROS  negative psych ROS   GI/Hepatic negative GI ROS, Neg liver ROS,   Endo/Other  negative endocrine ROS  Renal/GU negative Renal ROS  negative genitourinary   Musculoskeletal negative musculoskeletal ROS (+)   Abdominal   Peds negative pediatric ROS (+)  Hematology negative hematology ROS (+)   Anesthesia Other Findings   Reproductive/Obstetrics negative OB ROS                           Anesthesia Physical Anesthesia Plan  ASA: II and emergent  Anesthesia Plan: General   Post-op Pain Management:    Induction: Intravenous  Airway Management Planned: Oral ETT  Additional Equipment:   Intra-op Plan:   Post-operative Plan: Extubation in OR  Informed Consent: I have reviewed the patients History and Physical, chart, labs and discussed the procedure including the risks, benefits and alternatives for the proposed anesthesia with the patient or authorized representative who has indicated his/her understanding and acceptance.   Dental advisory given  Plan Discussed with: CRNA  Anesthesia Plan Comments:        Anesthesia Quick Evaluation

## 2015-01-28 NOTE — Op Note (Signed)
Pre-operative diagnosis : Impacted left ureterovesical junction stone  Postoperative diagnosis:  Same Operation:  Cystourethroscopy, left retrograde pyelogram interpretation, left ureteroscopy with basket extraction of left lower ureteral calculus, left double-J stent  Surgeon:  S. Gaynelle Arabian, MD  First assistant:  Nonegeneral  Anesthesia:  Gen. LMA  Preparation: After appropriate preanesthesia, the patient was brought to the operative room, placed on the operating table in the dorsal supine position where general LMA anesthesia was introduced. He was then replaced in the dorsal lithotomy position with pubis was prepped with Betadine solution and draped in the usual fashion. The history was reviewed. The x-rays were reviewed.   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 Pt awokwe with abdominal pain at 2 AM, and had progressive low abdominal pain, lateralizing to the LLQ and L inguinal canal this pm. Pt last ate at 2 pm ( 1/2 bowl soup). He had labs in [facility], showing Cr 1.5. He had ER evaluation for abdominal pain, and CT with reduced IV contrast protocol, with dx L U-V junction stone, 30mm, with severe L ureteral obstruction, with retroperitoneal rupture/extravisation.   Statement of  Likelihood of Success: Excellent. TIME-OUT observed.:  Procedure:  Cystourethroscopy was accomplished, and the patient was noted to have massive trilobar BPH with large median lobe. The bladder neck was elevated. The bladder showed trabeculation and cellule formation, but no diverticular formation was noted. There was no bladder stone or tumor formation. The ureteral orifices were laterally displaced. The left ureteral orifice was very difficult to find, but was found in the lateral portion of the trigone, and retrograde Pyelogram revealed severe J-hooking from enlarged prostate. A Glidewire was passed around the severe J- hooking, and through the lower ureter, and up  the ureter, into the renal pelvis. The stone was felt to be identifiable, on the magnified view. The 6.5 [place] double bridge scope was passed across the prostate and bladder neck, and into the bladder. However, I could not bend the scope enough to get it around the prostate to the ureteral orifice. Therefore, the 4.5 [place] needle-nose single-bridge scope was used Naval architect), and this was passed over the prostate, and into the bladder. Following the wire, I was able to negotiate the ureteral orifice, by passing a basket, up to the ureteral orifice, and probing the ureteral orifice, and following the basket into ureteral orifice. The ureteral orifice and the lower ureter were edematous, and required careful ureteroscopy. However, the stone was identified, and basket extracted. I intended to use a laser to break the stone, but the stone was able to be removed without the use of laser.  Repeat repeat ureteroscopy was necessary to replace the guidewire in the renal pelvis. Following this, a 6 [place] by 24 cm double-J stent was coiled in the renal pelvis, and in the bladder. Following this, the bladder was drained of fluid. The patient was awakened, and taken to recovery room in good condition. Because of the massive BPH, I elected to leave the Foley catheter and the double-J stent with suture attached which was taped to the penis. This can be removed separately in several days.

## 2015-01-29 ENCOUNTER — Encounter (HOSPITAL_COMMUNITY): Payer: Self-pay | Admitting: Nurse Practitioner

## 2015-01-29 DIAGNOSIS — R1032 Left lower quadrant pain: Secondary | ICD-10-CM | POA: Diagnosis not present

## 2015-01-29 DIAGNOSIS — R944 Abnormal results of kidney function studies: Secondary | ICD-10-CM | POA: Diagnosis not present

## 2015-01-29 DIAGNOSIS — S3710XA Unspecified injury of ureter, initial encounter: Secondary | ICD-10-CM | POA: Diagnosis not present

## 2015-01-29 DIAGNOSIS — N201 Calculus of ureter: Secondary | ICD-10-CM | POA: Diagnosis not present

## 2015-01-29 DIAGNOSIS — N200 Calculus of kidney: Secondary | ICD-10-CM | POA: Diagnosis not present

## 2015-01-29 LAB — BASIC METABOLIC PANEL
Anion gap: 8 (ref 5–15)
BUN: 22 mg/dL — ABNORMAL HIGH (ref 6–20)
CHLORIDE: 103 mmol/L (ref 101–111)
CO2: 23 mmol/L (ref 22–32)
CREATININE: 1.53 mg/dL — AB (ref 0.61–1.24)
Calcium: 9 mg/dL (ref 8.9–10.3)
GFR calc Af Amer: 48 mL/min — ABNORMAL LOW (ref >60)
GFR calc non Af Amer: 42 mL/min — ABNORMAL LOW (ref >60)
Glucose, Bld: 154 mg/dL — ABNORMAL HIGH (ref 65–99)
Potassium: 4.7 mmol/L (ref 3.5–5.1)
SODIUM: 134 mmol/L — AB (ref 135–145)

## 2015-01-29 MED ORDER — MENTHOL 3 MG MT LOZG
1.0000 | LOZENGE | OROMUCOSAL | Status: DC | PRN
Start: 2015-01-29 — End: 2015-01-29
  Administered 2015-01-29: 3 mg via ORAL
  Filled 2015-01-29: qty 9

## 2015-01-29 MED ORDER — URIBEL 118 MG PO CAPS: 1.0000 | ORAL_CAPSULE | Freq: Two times a day (BID) | ORAL | Status: DC | PRN

## 2015-01-29 MED ORDER — HYDROMORPHONE HCL 1 MG/ML IJ SOLN: 0.5000 mg | INTRAMUSCULAR | Status: DC | PRN

## 2015-01-29 MED ORDER — DIPHENHYDRAMINE HCL 50 MG/ML IJ SOLN: 12.5000 mg | Freq: Four times a day (QID) | INTRAMUSCULAR | Status: DC | PRN

## 2015-01-29 MED ORDER — DIPHENHYDRAMINE HCL 12.5 MG/5ML PO ELIX: 12.5000 mg | ORAL_SOLUTION | Freq: Four times a day (QID) | ORAL | Status: DC | PRN

## 2015-01-29 MED ORDER — SENNA 8.6 MG PO TABS
1.0000 | ORAL_TABLET | Freq: Two times a day (BID) | ORAL | Status: DC
  Administered 2015-01-29: 8.6 mg via ORAL
  Filled 2015-01-29: qty 1

## 2015-01-29 MED ORDER — FINASTERIDE 5 MG PO TABS: 5.0000 mg | ORAL_TABLET | Freq: Every day | ORAL | Status: AC

## 2015-01-29 MED ORDER — STANNOUS FLUORIDE 0.4 % DT GEL: 1.0000 "application " | Freq: Every day | DENTAL | Status: DC

## 2015-01-29 MED ORDER — MAGNESIUM CITRATE PO SOLN: 1.0000 | Freq: Once | ORAL | Status: DC | PRN

## 2015-01-29 MED ORDER — OXYCODONE-ACETAMINOPHEN 5-325 MG PO TABS: 1.0000 | ORAL_TABLET | ORAL | Status: DC | PRN

## 2015-01-29 MED ORDER — AMLODIPINE BESYLATE 5 MG PO TABS: 5.0000 mg | ORAL_TABLET | Freq: Every day | ORAL | Status: DC

## 2015-01-29 MED ORDER — TRIMETHOPRIM 100 MG PO TABS
100.0000 mg | ORAL_TABLET | ORAL | Status: DC
Start: 2015-01-29 — End: 2018-05-27

## 2015-01-29 MED ORDER — TAMSULOSIN HCL 0.4 MG PO CAPS: 0.4000 mg | ORAL_CAPSULE | Freq: Every day | ORAL | Status: DC

## 2015-01-29 MED ORDER — LITHIUM CARBONATE ER 450 MG PO TBCR
450.0000 mg | EXTENDED_RELEASE_TABLET | Freq: Every day | ORAL | Status: DC
  Administered 2015-01-29: 450 mg via ORAL
  Filled 2015-01-29 (×2): qty 1

## 2015-01-29 MED ORDER — LISINOPRIL 10 MG PO TABS: 10.0000 mg | ORAL_TABLET | Freq: Every day | ORAL | Status: DC

## 2015-01-29 MED ORDER — BACITRACIN-NEOMYCIN-POLYMYXIN 400-5-5000 EX OINT: 1.0000 "application " | TOPICAL_OINTMENT | Freq: Three times a day (TID) | CUTANEOUS | Status: DC | PRN

## 2015-01-29 MED ORDER — ZOLPIDEM TARTRATE 5 MG PO TABS: 5.0000 mg | ORAL_TABLET | Freq: Every evening | ORAL | Status: DC | PRN

## 2015-01-29 MED ORDER — CYANOCOBALAMIN 1000 MCG/ML IJ SOLN: 1000.0000 ug | INTRAMUSCULAR | Status: DC

## 2015-01-29 MED ORDER — BISACODYL 10 MG RE SUPP: 10.0000 mg | Freq: Every day | RECTAL | Status: DC | PRN

## 2015-01-29 MED ORDER — SULFAMETHOXAZOLE-TRIMETHOPRIM 800-160 MG PO TABS
1.0000 | ORAL_TABLET | Freq: Two times a day (BID) | ORAL | Status: DC
  Administered 2015-01-29: 1 via ORAL
  Filled 2015-01-29: qty 1

## 2015-01-29 MED ORDER — NORTRIPTYLINE HCL 25 MG PO CAPS
75.0000 mg | ORAL_CAPSULE | Freq: Two times a day (BID) | ORAL | Status: DC
  Administered 2015-01-29: 50 mg via ORAL
  Filled 2015-01-29 (×3): qty 3

## 2015-01-29 MED ORDER — OXYBUTYNIN CHLORIDE 5 MG PO TABS: 5.0000 mg | ORAL_TABLET | Freq: Three times a day (TID) | ORAL | Status: DC | PRN

## 2015-01-29 MED ORDER — ONDANSETRON HCL 4 MG/2ML IJ SOLN: 4.0000 mg | INTRAMUSCULAR | Status: DC | PRN

## 2015-01-29 NOTE — Discharge Instructions (Signed)
Dietary Guidelines to Help Prevent Kidney Stones  Your risk of kidney stones can be decreased by adjusting the foods you eat. The most important thing you can do is drink enough fluid. You should drink enough fluid to keep your urine clear or pale yellow. The following guidelines provide specific information for the type of kidney stone you have had.  GUIDELINES ACCORDING TO TYPE OF KIDNEY STONE  Calcium Oxalate Kidney Stones  · Reduce the amount of salt you eat. Foods that have a lot of salt cause your body to release excess calcium into your urine. The excess calcium can combine with a substance called oxalate to form kidney stones.  · Reduce the amount of animal protein you eat if the amount you eat is excessive. Animal protein causes your body to release excess calcium into your urine. Ask your dietitian how much protein from animal sources you should be eating.  · Avoid foods that are high in oxalates. If you take vitamins, they should have less than 500 mg of vitamin C. Your body turns vitamin C into oxalates. You do not need to avoid fruits and vegetables high in vitamin C.  Calcium Phosphate Kidney Stones  · Reduce the amount of salt you eat to help prevent the release of excess calcium into your urine.  · Reduce the amount of animal protein you eat if the amount you eat is excessive. Animal protein causes your body to release excess calcium into your urine. Ask your dietitian how much protein from animal sources you should be eating.  · Get enough calcium from food or take a calcium supplement (ask your dietitian for recommendations). Food sources of calcium that do not increase your risk of kidney stones include:  ¨ Broccoli.  ¨ Dairy products, such as cheese and yogurt.  ¨ Pudding.  Uric Acid Kidney Stones  · Do not have more than 6 oz of animal protein per day.  FOOD SOURCES  Animal Protein Sources  · Meat (all types).  · Poultry.  · Eggs.  · Fish, seafood.  Foods High in Salt  · Salt seasonings.  · Soy  sauce.  · Teriyaki sauce.  · Cured and processed meats.  · Salted crackers and snack foods.  · Fast food.  · Canned soups and most canned foods.  Foods High in Oxalates  · Grains:  ¨ Amaranth.  ¨ Barley.  ¨ Grits.  ¨ Wheat germ.  ¨ Bran.  ¨ Buckwheat flour.  ¨ All bran cereals.  ¨ Pretzels.  ¨ Whole wheat bread.  · Vegetables:  ¨ Beans (wax).  ¨ Beets and beet greens.  ¨ Collard greens.  ¨ Eggplant.  ¨ Escarole.  ¨ Leeks.  ¨ Okra.  ¨ Parsley.  ¨ Rutabagas.  ¨ Spinach.  ¨ Swiss chard.  ¨ Tomato paste.  ¨ Fried potatoes.  ¨ Sweet potatoes.  · Fruits:  ¨ Red currants.  ¨ Figs.  ¨ Kiwi.  ¨ Rhubarb.  · Meat and Other Protein Sources:  ¨ Beans (dried).  ¨ Soy burgers and other soybean products.  ¨ Miso.  ¨ Nuts (peanuts, almonds, pecans, cashews, hazelnuts).  ¨ Nut butters.  ¨ Sesame seeds and tahini (paste made of sesame seeds).  ¨ Poppy seeds.  · Beverages:  ¨ Chocolate drink mixes.  ¨ Soy milk.  ¨ Instant iced tea.  ¨ Juices made from high-oxalate fruits or vegetables.  · Other:  ¨ Carob.  ¨ Chocolate.  ¨ Fruitcake.  ¨ Marmalades.  Document Released:   10/07/2010 Document Revised: 06/17/2013 Document Reviewed: 05/09/2013  ExitCare® Patient Information ©2015 [employer]. This information is not intended to replace advice given to you by your health care provider. Make sure you discuss any questions you have with your health care provider.

## 2015-01-29 NOTE — Anesthesia Postprocedure Evaluation (Signed)
  Anesthesia Post-op Note  Patient: Chase Mathis  Procedure(s) Performed: Procedure(s) (LRB): CYSTOSCOPY WITH LEFT RETROGRADE PYELOGRAM, LEFT URETEROSCOPY/STONE BASKETING  AND LEFT URETERAL  STENT PLACEMENT (Left) HOLMIUM LASER APPLICATION (Left)  Patient Location: PACU  Anesthesia Type: General  Level of Consciousness: awake and alert   Airway and Oxygen Therapy: Patient Spontanous Breathing  Post-op Pain: mild  Post-op Assessment: Post-op Vital signs reviewed, Patient's Cardiovascular Status Stable, Respiratory Function Stable, Patent Airway and No signs of Nausea or vomiting  Last Vitals:  Filed Vitals:   01/29/15 0531  BP: 122/61  Pulse: 69  Temp: 36.4 C  Resp: 16    Post-op Vital Signs: stable   Complications: No apparent anesthesia complications

## 2015-01-29 NOTE — Progress Notes (Signed)
Urology Progress Note  1 Day Post-Op   Subjective:post  Basket extraction stone.  Cr: 1.5 ( no change) BPH: will d/c on flomax and finasteride Pain: percocet.  Remove stent on Sunday AM.      No acute urologic events overnight. Ambulation:   positive Flatus:    positive Bowel movement  negative  Pain: complete resolution  Objective:  Blood pressure 122/61, pulse 69, temperature 97.5 F (36.4 C), temperature source Oral, resp. rate 16, height 5\' 9" (1.753 m), weight 99.791 kg (220 lb), SpO2 100 %.  Physical Exam:  General:  No acute distress, awake Extremities: extremities normal, atraumatic, no cyanosis or edema Genitourinary:  Normal BUS Foley:  Still in.    I/O last 3 completed shifts: In: 1300 [I.V.:1300] Out: 930 [Urine:930]  Recent Labs     01/28/15  1714  HGB  15.2  WBC  12.8*  PLT  255    Recent Labs     08 /04/16  1714  01/29/15  0650  NA  137  134*  K  4.8  4.7  CL  103  103  CO2  24  23  BUN  19  22*  CREATININE  1.52*  1.53*  CALCIUM  9.7  9.0  GFRNONAA  42*  42*  GFRAA  48*  48*     No results for input(s): INR, APTT in the last 72 hours.  Invalid input(s): PT   Invalid input(s): ABG  Assessment/Plan:  Catheter removed.this AM D/c this AM

## 2015-01-29 NOTE — Discharge Summary (Signed)
Physician Discharge Summary  Patient ID: Chase Mathis MRN: [id number] DOB/AGE: 1933-09-21 81 y.o.  Admit date: 01/28/2015 Discharge date: 01/29/2015  Admission Diagnoses: Kidney stone [N20.0] Ureter injury, initial encounter [S37.10XA]  Discharge Diagnoses:  Active Problems:   Left ureteral stone BPH  Discharged Condition:Improved  Hospital Course: cysto, basket extraction L ureteral stone, JJ stent  Significant Diagnostic Studies: Ct Abdomen Pelvis W Contrast  01/28/2015   CLINICAL DATA:  Mid and lower abdominal pain which had the intermittent but is now constant. Initial encounter.  EXAM: CT ABDOMEN AND PELVIS WITH CONTRAST  TECHNIQUE: Multidetector CT imaging of the abdomen and pelvis was performed using the standard protocol following bolus administration of intravenous contrast.  CONTRAST:  80 mL OMNIPAQUE IOHEXOL 300 MG/ML  SOLN  COMPARISON:  Plain films of the abdomen earlier today.  FINDINGS: Dependent atelectasis is seen in the lung bases. Calcific coronary artery and aortic atherosclerosis is noted. No pleural or pericardial effusion.  There is mild left hydronephrosis with stranding about the left kidney and ureter the due to a punctate stone at the left UVJ. Urine is identified in the left retroperitoneal space along the psoas muscle. On delayed imaging, contrast extravasates out of the left renal pelvis or proximal left ureter into the retroperitoneal space. Three left renal cysts are noted. The right kidney appears normal. The urinary bladder is distended but otherwise unremarkable. The prostate gland is markedly enlarged.  A few small cysts are seen scattered in the liver. The liver is otherwise unremarkable. The gallbladder, adrenal glands and pancreas appear normal. Sigmoid diverticulosis without diverticulitis is noted. Large volume of stool in the ascending colon is identified. The small bowel is unremarkable. There is no ileus.  No lytic or sclerotic bony lesion is identified.  Autologous L5-S1 fusion is noted. The patient has transitional anatomy at the lumbosacral junction.  IMPRESSION: Extravasation of urine into the retroperitoneum either from the left renal pelvis or proximal left ureter. Punctate stone is identified at the UVJ but there is no left hydronephrosis as the left kidney is likely decompressed by the leakage of urine.  Marked prostatomegaly. The urinary bladder is markedly distended likely due to outlet obstruction.  Fatty infiltration of liver.  Critical Value/emergent results were called by telephone at the time of interpretation on 01/28/2015 at 7:20 pm to Dr. Ezequiel Essex , who verbally acknowledged these results.   Electronically Signed   By: Inge Rise M.D.   On: 01/28/2015 19:22   Dg Abd 2 Views  01/28/2015   CLINICAL DATA:  Abdominal pain.  EXAM: ABDOMEN - 2 VIEW  COMPARISON:  None.  FINDINGS: Soft tissue structures are unremarkable. Dilated loops of small and large bowel noted. Findings are most consistent with adynamic ileus. Follow-up abdominal series suggested to demonstrate clearing and to exclude bowel obstruction. No free air. Stool noted throughout the colon. No pathologic intra-abdominal calcifications. No acute bony abnormality.  IMPRESSION: Dilated loops of small and large bowel most consistent with adynamic ileus. Follow-up abdominal series is suggested to exclude developing obstruction. No free air. Stool noted throughout the colon.   Electronically Signed   By: Marcello Moores  Register   On: 01/28/2015 14:43    Discharge Exam: Blood pressure 122/61, pulse 69, temperature 97.5 F (36.4 C), temperature source Oral, resp. rate 16, height 5\' 9"  (1.753 m), weight 99.791 kg (220 lb), SpO2 100 %.   Disposition: Home  Discharge Instructions    Care order/instruction    Complete by:  As directed   Pt  may remove JJ stent Sunday AM by pulling the suture out of the urethra and discarding the JJ stent.     Discontinue IV    Complete by:  As directed       Foley catheter - discontinue    Complete by:  As directed             Medication List    TAKE these medications        amLODipine 5 MG tablet  Commonly known as:  NORVASC  Take 5 mg by mouth daily.     cyanocobalamin 1000 MCG/ML injection  Commonly known as:  (VITAMIN B-12)  Inject 1,000 mcg into the muscle every 30 (thirty) days.     finasteride 5 MG tablet  Commonly known as:  PROSCAR  Take 1 tablet (5 mg total) by mouth daily.     GEL-KAM 0.4 % Gel  Generic drug:  Stannous Fluoride  Place 1-2 application onto teeth daily.     lisinopril 10 MG tablet  Commonly known as:  PRINIVIL,ZESTRIL  Take 10 mg by mouth daily.     lithium carbonate 450 MG CR tablet  Commonly known as:  ESKALITH  Take 450 mg by mouth at bedtime.     nortriptyline 50 MG capsule  Commonly known as:  PAMELOR  Take 150 mg by mouth at bedtime.     oxyCODONE-acetaminophen 5-325 MG per tablet  Commonly known as:  ROXICET  Take 1 tablet by mouth every 4 (four) hours as needed for severe pain.     tamsulosin 0.4 MG Caps capsule  Commonly known as:  FLOMAX  Take 1 capsule (0.4 mg total) by mouth daily.     trimethoprim 100 MG tablet  Commonly known as:  TRIMPEX  Take 1 tablet (100 mg total) by mouth 1 day or 1 dose.     URIBEL 118 MG Caps  Take 1 capsule (118 mg total) by mouth 3 times/day as needed-between meals & bedtime.           Follow-up Information    Call Ailene Rud, MD.   Specialty:  Urology   Contact information:   [facility] [place] [address] [phone]     1. Flomax/finasteride 2. Trimethoprim 3. Percocert 4. Uribel 5, Remove JJ ::self on Sunday AM.  Signed: Ailene Rud 01/29/2015, 8:57 AM

## 2015-01-29 NOTE — Progress Notes (Signed)
Patient d/c home with belongings, AVS, IVs removed, foley removed and pt voided. Pt refused wheelchair, walked downstairs with wife. Pt stable at time of discharge.

## 2015-02-18 DIAGNOSIS — F325 Major depressive disorder, single episode, in full remission: Secondary | ICD-10-CM | POA: Diagnosis not present

## 2015-02-18 DIAGNOSIS — E669 Obesity, unspecified: Secondary | ICD-10-CM | POA: Diagnosis not present

## 2015-02-18 DIAGNOSIS — Z79899 Other long term (current) drug therapy: Secondary | ICD-10-CM | POA: Diagnosis not present

## 2015-02-18 DIAGNOSIS — Z6837 Body mass index (BMI) 37.0-37.9, adult: Secondary | ICD-10-CM | POA: Diagnosis not present

## 2015-02-18 DIAGNOSIS — I129 Hypertensive chronic kidney disease with stage 1 through stage 4 chronic kidney disease, or unspecified chronic kidney disease: Secondary | ICD-10-CM | POA: Diagnosis not present

## 2015-02-18 DIAGNOSIS — E780 Pure hypercholesterolemia: Secondary | ICD-10-CM | POA: Diagnosis not present

## 2015-02-18 DIAGNOSIS — Z0000 Encounter for general adult medical examination without abnormal findings: Secondary | ICD-10-CM | POA: Diagnosis not present

## 2015-02-18 DIAGNOSIS — G629 Polyneuropathy, unspecified: Secondary | ICD-10-CM | POA: Diagnosis not present

## 2015-02-18 DIAGNOSIS — Z1389 Encounter for screening for other disorder: Secondary | ICD-10-CM | POA: Diagnosis not present

## 2015-02-18 DIAGNOSIS — I10 Essential (primary) hypertension: Secondary | ICD-10-CM | POA: Diagnosis not present

## 2015-02-18 DIAGNOSIS — N183 Chronic kidney disease, stage 3 (moderate): Secondary | ICD-10-CM | POA: Diagnosis not present

## 2015-04-22 DIAGNOSIS — Z79899 Other long term (current) drug therapy: Secondary | ICD-10-CM | POA: Diagnosis not present

## 2015-04-22 DIAGNOSIS — F3342 Major depressive disorder, recurrent, in full remission: Secondary | ICD-10-CM | POA: Diagnosis not present

## 2015-04-26 DIAGNOSIS — F321 Major depressive disorder, single episode, moderate: Secondary | ICD-10-CM | POA: Diagnosis not present

## 2015-06-03 DIAGNOSIS — H35373 Puckering of macula, bilateral: Secondary | ICD-10-CM

## 2015-06-03 DIAGNOSIS — H2513 Age-related nuclear cataract, bilateral: Secondary | ICD-10-CM | POA: Diagnosis not present

## 2015-06-03 DIAGNOSIS — H43393 Other vitreous opacities, bilateral: Secondary | ICD-10-CM

## 2015-06-03 HISTORY — DX: Puckering of macula, bilateral: H35.373

## 2015-06-03 HISTORY — DX: Other vitreous opacities, bilateral: H43.393

## 2015-07-12 DIAGNOSIS — Z0000 Encounter for general adult medical examination without abnormal findings: Secondary | ICD-10-CM | POA: Diagnosis not present

## 2015-07-12 DIAGNOSIS — N201 Calculus of ureter: Secondary | ICD-10-CM | POA: Diagnosis not present

## 2015-09-27 DIAGNOSIS — J209 Acute bronchitis, unspecified: Secondary | ICD-10-CM | POA: Diagnosis not present

## 2015-10-06 DIAGNOSIS — Z6837 Body mass index (BMI) 37.0-37.9, adult: Secondary | ICD-10-CM | POA: Diagnosis not present

## 2015-10-06 DIAGNOSIS — E669 Obesity, unspecified: Secondary | ICD-10-CM | POA: Diagnosis not present

## 2015-10-06 DIAGNOSIS — I129 Hypertensive chronic kidney disease with stage 1 through stage 4 chronic kidney disease, or unspecified chronic kidney disease: Secondary | ICD-10-CM | POA: Diagnosis not present

## 2015-10-06 DIAGNOSIS — N183 Chronic kidney disease, stage 3 (moderate): Secondary | ICD-10-CM | POA: Diagnosis not present

## 2016-03-03 DIAGNOSIS — L308 Other specified dermatitis: Secondary | ICD-10-CM | POA: Diagnosis not present

## 2016-03-31 DIAGNOSIS — B353 Tinea pedis: Secondary | ICD-10-CM | POA: Diagnosis not present

## 2016-03-31 DIAGNOSIS — L309 Dermatitis, unspecified: Secondary | ICD-10-CM | POA: Diagnosis not present

## 2016-04-06 DIAGNOSIS — Z6837 Body mass index (BMI) 37.0-37.9, adult: Secondary | ICD-10-CM | POA: Diagnosis not present

## 2016-04-06 DIAGNOSIS — Z79899 Other long term (current) drug therapy: Secondary | ICD-10-CM | POA: Diagnosis not present

## 2016-04-06 DIAGNOSIS — Z0000 Encounter for general adult medical examination without abnormal findings: Secondary | ICD-10-CM | POA: Diagnosis not present

## 2016-04-06 DIAGNOSIS — N183 Chronic kidney disease, stage 3 (moderate): Secondary | ICD-10-CM | POA: Diagnosis not present

## 2016-04-06 DIAGNOSIS — E669 Obesity, unspecified: Secondary | ICD-10-CM | POA: Diagnosis not present

## 2016-04-06 DIAGNOSIS — F325 Major depressive disorder, single episode, in full remission: Secondary | ICD-10-CM | POA: Diagnosis not present

## 2016-04-06 DIAGNOSIS — I129 Hypertensive chronic kidney disease with stage 1 through stage 4 chronic kidney disease, or unspecified chronic kidney disease: Secondary | ICD-10-CM | POA: Diagnosis not present

## 2016-06-15 DIAGNOSIS — H25813 Combined forms of age-related cataract, bilateral: Secondary | ICD-10-CM | POA: Diagnosis not present

## 2016-06-15 DIAGNOSIS — H35373 Puckering of macula, bilateral: Secondary | ICD-10-CM | POA: Diagnosis not present

## 2016-06-15 DIAGNOSIS — H43393 Other vitreous opacities, bilateral: Secondary | ICD-10-CM | POA: Diagnosis not present

## 2016-06-15 HISTORY — DX: Combined forms of age-related cataract, bilateral: H25.813

## 2016-06-30 DIAGNOSIS — H5213 Myopia, bilateral: Secondary | ICD-10-CM | POA: Diagnosis not present

## 2016-06-30 DIAGNOSIS — H35373 Puckering of macula, bilateral: Secondary | ICD-10-CM | POA: Diagnosis not present

## 2016-06-30 DIAGNOSIS — H2513 Age-related nuclear cataract, bilateral: Secondary | ICD-10-CM | POA: Diagnosis not present

## 2016-08-02 DIAGNOSIS — F321 Major depressive disorder, single episode, moderate: Secondary | ICD-10-CM | POA: Diagnosis not present

## 2016-09-28 DIAGNOSIS — L308 Other specified dermatitis: Secondary | ICD-10-CM | POA: Diagnosis not present

## 2016-10-04 DIAGNOSIS — Z6838 Body mass index (BMI) 38.0-38.9, adult: Secondary | ICD-10-CM | POA: Diagnosis not present

## 2016-10-04 DIAGNOSIS — E669 Obesity, unspecified: Secondary | ICD-10-CM | POA: Diagnosis not present

## 2016-10-04 DIAGNOSIS — R739 Hyperglycemia, unspecified: Secondary | ICD-10-CM | POA: Diagnosis not present

## 2016-10-04 DIAGNOSIS — I129 Hypertensive chronic kidney disease with stage 1 through stage 4 chronic kidney disease, or unspecified chronic kidney disease: Secondary | ICD-10-CM | POA: Diagnosis not present

## 2016-10-04 DIAGNOSIS — Z79899 Other long term (current) drug therapy: Secondary | ICD-10-CM | POA: Diagnosis not present

## 2016-10-04 DIAGNOSIS — N183 Chronic kidney disease, stage 3 (moderate): Secondary | ICD-10-CM | POA: Diagnosis not present

## 2016-12-04 DIAGNOSIS — R7301 Impaired fasting glucose: Secondary | ICD-10-CM | POA: Diagnosis not present

## 2016-12-19 ENCOUNTER — Encounter (HOSPITAL_COMMUNITY): Payer: Self-pay | Admitting: Emergency Medicine

## 2016-12-19 ENCOUNTER — Emergency Department (HOSPITAL_COMMUNITY)
Admission: EM | Admit: 2016-12-19 | Discharge: 2016-12-20 | Disposition: A | Discharge status: Home / Self Care | Payer: Medicare Other | Attending: Emergency Medicine | Admitting: Emergency Medicine

## 2016-12-19 ENCOUNTER — Emergency Department (HOSPITAL_COMMUNITY): Payer: Medicare Other

## 2016-12-19 DIAGNOSIS — Z79899 Other long term (current) drug therapy: Secondary | ICD-10-CM | POA: Diagnosis not present

## 2016-12-19 DIAGNOSIS — I10 Essential (primary) hypertension: Secondary | ICD-10-CM | POA: Insufficient documentation

## 2016-12-19 DIAGNOSIS — R1084 Generalized abdominal pain: Secondary | ICD-10-CM

## 2016-12-19 DIAGNOSIS — R109 Unspecified abdominal pain: Secondary | ICD-10-CM | POA: Diagnosis not present

## 2016-12-19 LAB — COMPREHENSIVE METABOLIC PANEL
ALBUMIN: 4.4 g/dL (ref 3.5–5.0)
ALK PHOS: 54 U/L (ref 38–126)
ALT: 42 U/L (ref 17–63)
AST: 29 U/L (ref 15–41)
Anion gap: 10 (ref 5–15)
BILIRUBIN TOTAL: 0.7 mg/dL (ref 0.3–1.2)
BUN: 22 mg/dL — AB (ref 6–20)
CALCIUM: 8.7 mg/dL — AB (ref 8.9–10.3)
CO2: 20 mmol/L — AB (ref 22–32)
CREATININE: 1.22 mg/dL (ref 0.61–1.24)
Chloride: 107 mmol/L (ref 101–111)
GFR calc Af Amer: >60 mL/min (ref >60)
GFR calc non Af Amer: 54 mL/min — ABNORMAL LOW (ref >60)
GLUCOSE: 118 mg/dL — AB (ref 65–99)
Potassium: 4.4 mmol/L (ref 3.5–5.1)
SODIUM: 137 mmol/L (ref 135–145)
TOTAL PROTEIN: 7.5 g/dL (ref 6.5–8.1)

## 2016-12-19 LAB — CBC WITH DIFFERENTIAL/PLATELET
BASOS PCT: 0 %
Basophils Absolute: 0 10*3/uL (ref 0.0–0.1)
EOS ABS: 0.1 10*3/uL (ref 0.0–0.7)
Eosinophils Relative: 1 %
HEMATOCRIT: 46.2 % (ref 39.0–52.0)
HEMOGLOBIN: 15.6 g/dL (ref 13.0–17.0)
LYMPHS ABS: 1.3 10*3/uL (ref 0.7–4.0)
Lymphocytes Relative: 11 %
MCH: 30.1 pg (ref 26.0–34.0)
MCHC: 33.8 g/dL (ref 30.0–36.0)
MCV: 89 fL (ref 78.0–100.0)
MONO ABS: 0.9 10*3/uL (ref 0.1–1.0)
MONOS PCT: 7 %
NEUTROS PCT: 81 %
Neutro Abs: 10.2 10*3/uL — ABNORMAL HIGH (ref 1.7–7.7)
Platelets: 265 10*3/uL (ref 150–400)
RBC: 5.19 MIL/uL (ref 4.22–5.81)
RDW: 14.1 % (ref 11.5–15.5)
WBC: 12.5 10*3/uL — ABNORMAL HIGH (ref 4.0–10.5)

## 2016-12-19 LAB — LIPASE, BLOOD: Lipase: 21 U/L (ref 11–51)

## 2016-12-19 MED ORDER — SODIUM CHLORIDE 0.9 % IV BOLUS (SEPSIS)
500.0000 mL | Freq: Once | INTRAVENOUS | Status: AC
  Administered 2016-12-19: 500 mL via INTRAVENOUS

## 2016-12-19 MED ORDER — IOPAMIDOL (ISOVUE-300) INJECTION 61%
INTRAVENOUS | Status: AC
  Filled 2016-12-19: qty 100

## 2016-12-19 MED ORDER — FENTANYL CITRATE (PF) 100 MCG/2ML IJ SOLN
50.0000 ug | Freq: Once | INTRAMUSCULAR | Status: AC | PRN
  Administered 2016-12-19: 50 ug via INTRAVENOUS
  Filled 2016-12-19: qty 2

## 2016-12-19 MED ORDER — SODIUM CHLORIDE 0.9 % IV SOLN
INTRAVENOUS | Status: DC
  Administered 2016-12-19: 22:00:00 via INTRAVENOUS

## 2016-12-19 MED ORDER — IOPAMIDOL (ISOVUE-300) INJECTION 61%
100.0000 mL | Freq: Once | INTRAVENOUS | Status: AC | PRN
  Administered 2016-12-19: 100 mL via INTRAVENOUS

## 2016-12-19 MED ORDER — ONDANSETRON HCL 4 MG/2ML IJ SOLN: 4.0000 mg | Freq: Once | INTRAMUSCULAR | Status: DC

## 2016-12-19 NOTE — ED Triage Notes (Signed)
Patient c/o mid line abdominal pain. States it started with a lot of belching, diarrhea, and abdominal cramping. 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 Pt has a hx of kidney stone with ureteral surgery but current symptoms are a-typical.

## 2016-12-19 NOTE — ED Notes (Signed)
Patient transported to CT 

## 2016-12-19 NOTE — ED Provider Notes (Signed)
[facility] DEPT Provider Note   CSN: [id number] Arrival date & time: 12/19/16  2024     History   Chief Complaint Chief Complaint  Patient presents with  . Abdominal Pain    HPI Samnang Shugars is a 83 y.o. male.  HPI Patient presents to the emergency room with complaints of abdominal pain. The patient is a retired Dealer. He started having some lower abdominal discomfort around 9 AM this morning. The pain is primarily midline and constant. Had some intermittent belching as well as some diarrhea without any vomiting.  Denies any dysuria or frequency.  Symptoms persisted throughout the day and started to increase. He has a history of left-sided ureteral stone requiring cystoscopy and placement.  Patient states his symptoms were atypical when he had his previous kidney stone.  He denies any history of diverticulitis. No prior history of appendectomy cholecystectomy. Past Medical History:  Diagnosis Date  . Hypertension     Patient Active Problem List   Diagnosis Date Noted  . Left ureteral stone 01/28/2015    Past Surgical History:  Procedure Laterality Date  . CYSTOSCOPY WITH RETROGRADE PYELOGRAM, URETEROSCOPY AND STENT PLACEMENT Left 01/28/2015   Procedure: CYSTOSCOPY WITH LEFT RETROGRADE PYELOGRAM, LEFT URETEROSCOPY/STONE BASKETING  AND LEFT URETERAL  STENT PLACEMENT;  Surgeon: Carolan Clines, MD;  Location: [facility]S;  Service: Urology;  Laterality: Left;       Home Medications    Prior to Admission medications   Medication Sig Start Date End Date Taking? Authorizing Provider  amLODipine (NORVASC) 5 MG tablet Take 5 mg by mouth daily.    [provider]  cyanocobalamin (,VITAMIN B-12,) 1000 MCG/ML injection Inject 1,000 mcg into the muscle every 30 (thirty) days.    [provider]  finasteride (PROSCAR) 5 MG tablet Take 1 tablet (5 mg total) by mouth daily. 01/29/15   Carolan Clines, MD  lisinopril (PRINIVIL,ZESTRIL) 10 MG tablet Take 10 mg by  mouth daily.    [provider]  lithium carbonate (ESKALITH) 450 MG CR tablet Take 450 mg by mouth at bedtime.    [provider]  Meth-Hyo-M Bl-Na Phos-Ph Sal (URIBEL) 118 MG CAPS Take 1 capsule (118 mg total) by mouth 3 times/day as needed-between meals & bedtime. 01/29/15   Carolan Clines, MD  nortriptyline (PAMELOR) 50 MG capsule Take 150 mg by mouth at bedtime.    [provider]  oxyCODONE-acetaminophen (ROXICET) 5-325 MG per tablet Take 1 tablet by mouth every 4 (four) hours as needed for severe pain. 01/29/15   Carolan Clines, MD  Stannous Fluoride (GEL-KAM) 0.4 % GEL Place 1-2 application onto teeth daily.    [provider]  tamsulosin (FLOMAX) 0.4 MG CAPS capsule Take 1 capsule (0.4 mg total) by mouth daily. 01/29/15   Carolan Clines, MD  trimethoprim (TRIMPEX) 100 MG tablet Take 1 tablet (100 mg total) by mouth 1 day or 1 dose. 01/29/15   Carolan Clines, MD    Family History History reviewed. No pertinent family history.  Social History Social History  Substance Use Topics  . Smoking status: Never Smoker  . Smokeless tobacco: Never Used  . Alcohol use Yes     Comment: occasionally     Allergies   Patient has no known allergies.   Review of Systems Review of Systems  Respiratory: Negative for shortness of breath.   Cardiovascular: Negative for chest pain.  All other systems reviewed and are negative.    Physical Exam Updated Vital Signs BP (!) 148/79  Pulse 79   Temp 97.5 F (36.4 C) (Oral)   Resp 19   Ht 1.727 m (5\' 8" )   Wt 90.7 kg (200 lb)   SpO2 97%   BMI 30.41 kg/m   Physical Exam  Constitutional: He appears well-developed and well-nourished. No distress.  HENT:  Head: Normocephalic and atraumatic.  Right Ear: External ear normal.  Left Ear: External ear normal.  Eyes: Conjunctivae are normal. Right eye exhibits no discharge. Left eye exhibits no discharge. No scleral icterus.  Neck: Neck supple.  No tracheal deviation present.  Cardiovascular: Normal rate, regular rhythm and intact distal pulses.   Pulmonary/Chest: Effort normal and breath sounds normal. No stridor. No respiratory distress. He has no wheezes. He has no rales.  Abdominal: Soft. Bowel sounds are normal. He exhibits no distension. There is tenderness (mild) in the periumbilical area and suprapubic area. There is no rigidity, no rebound and no guarding. No hernia.  Musculoskeletal: He exhibits no edema or tenderness.  Neurological: He is alert. He has normal strength. No cranial nerve deficit (no facial droop, extraocular movements intact, no slurred speech) or sensory deficit. He exhibits normal muscle tone. He displays no seizure activity. Coordination normal.  Skin: Skin is warm and dry. No rash noted.  Psychiatric: He has a normal mood and affect.  Nursing note and vitals reviewed.    ED Treatments / Results  Labs (all labs ordered are listed, but only abnormal results are displayed) Labs Reviewed  COMPREHENSIVE METABOLIC PANEL  LIPASE, BLOOD  CBC WITH DIFFERENTIAL/PLATELET  URINALYSIS, ROUTINE W REFLEX MICROSCOPIC    EKG  EKG Interpretation  Date/Time:  Tuesday December 19 2016 22:15:14 EDT Ventricular Rate:  68 PR Interval:    QRS Duration: 98 QT Interval:  408 QTC Calculation: 434 R Axis:   172 Text Interpretation:  Sinus or ectopic atrial rhythm Right ventricular hypertrophy No old tracing to compare Confirmed by Dorie Rank [phone]) on 12/19/2016 10:20:39 PM Also confirmed by Dorie Rank [phone]), editor Hattie Perch ([id number])  on 12/20/2016 7:43:31 AM       Radiology No results found.  Procedures Procedures (including critical care time)  Medications Ordered in ED Medications  ondansetron (ZOFRAN) injection 4 mg (not administered)  fentaNYL (SUBLIMAZE) injection 50 mcg (not administered)  0.9 %  sodium chloride infusion (not administered)     Initial Impression / Assessment and Plan / ED  Course  I have reviewed the triage vital signs and the nursing notes.  Pertinent labs & imaging results that were available during my care of the patient were reviewed by me and considered in my medical decision making (see chart for details).   Pt presented to the ED with abdominal pain.  Labs notable for elevated WBC.  Case signed over to Dr Dina Rich pending CT scan and ua. Final Clinical Impressions(s) / ED Diagnoses   pending   Dorie Rank, MD 12/22/16 [phone]

## 2016-12-20 DIAGNOSIS — R109 Unspecified abdominal pain: Secondary | ICD-10-CM | POA: Diagnosis not present

## 2016-12-20 DIAGNOSIS — R1084 Generalized abdominal pain: Secondary | ICD-10-CM | POA: Diagnosis not present

## 2016-12-20 LAB — URINALYSIS, ROUTINE W REFLEX MICROSCOPIC
Bilirubin Urine: NEGATIVE
GLUCOSE, UA: NEGATIVE mg/dL
HGB URINE DIPSTICK: NEGATIVE
KETONES UR: 5 mg/dL — AB
Leukocytes, UA: NEGATIVE
Nitrite: NEGATIVE
PH: 5 (ref 5.0–8.0)
PROTEIN: NEGATIVE mg/dL
Specific Gravity, Urine: 1.032 — ABNORMAL HIGH (ref 1.005–1.030)

## 2016-12-20 MED ORDER — HYDROCODONE-ACETAMINOPHEN 5-325 MG PO TABS: 1.0000 | ORAL_TABLET | Freq: Four times a day (QID) | ORAL | 0 refills | Status: DC | PRN

## 2016-12-20 NOTE — ED Provider Notes (Signed)
Patient signed out pending CT scan and urinalysis. CT scan with nonspecific fluid-filled bowel with a differential to include enteritis, early ileus, early bowel obstruction. On my assessment, patient is comfortable. He denies any obstructive symptoms. No vomiting. He is having normal bowel movements. Urinalysis is reassuring. I discussed the CT results with the patient.  Given that he feels well, he would like to go home. He's able to tolerate fluids. He is requesting some pain medication in case his pain returns. I discussed with him to limit narcotic pain medication if this is a develop ileus it will make it worse. He stated understanding. He will follow-up closely with his primary physician later today.  After history, exam, and medical workup I feel the patient has been appropriately medically screened and is safe for discharge home. Pertinent diagnoses were discussed with the patient. Patient was given return precautions.    Merryl Hacker, MD 12/20/16 [phone]

## 2016-12-20 NOTE — Discharge Instructions (Signed)
You were seen today for abdominal pain. Your workup is largely reassuring. Your CT scan does show some fluid-filled bowel. This is nonspecific. You do not have any signs of obstruction on exam. This could be early enteritis or ileus.  Follow-up closely with your primary physician. You will be given a short course of pain medication. Avoid narcotic pain medication if your pain is well-controlled as this can contribute to worsening bowel function. If you develop vomiting, decrease in bowel movements, worsening pain you need to be reevaluated immediately.

## 2016-12-22 DIAGNOSIS — K529 Noninfective gastroenteritis and colitis, unspecified: Secondary | ICD-10-CM | POA: Diagnosis not present

## 2016-12-25 DIAGNOSIS — K529 Noninfective gastroenteritis and colitis, unspecified: Secondary | ICD-10-CM | POA: Diagnosis not present

## 2017-03-05 DIAGNOSIS — R7301 Impaired fasting glucose: Secondary | ICD-10-CM | POA: Diagnosis not present

## 2017-04-27 DIAGNOSIS — F321 Major depressive disorder, single episode, moderate: Secondary | ICD-10-CM | POA: Diagnosis not present

## 2017-04-30 DIAGNOSIS — I129 Hypertensive chronic kidney disease with stage 1 through stage 4 chronic kidney disease, or unspecified chronic kidney disease: Secondary | ICD-10-CM | POA: Diagnosis not present

## 2017-04-30 DIAGNOSIS — Z79899 Other long term (current) drug therapy: Secondary | ICD-10-CM | POA: Diagnosis not present

## 2017-04-30 DIAGNOSIS — F325 Major depressive disorder, single episode, in full remission: Secondary | ICD-10-CM | POA: Diagnosis not present

## 2017-05-25 DIAGNOSIS — L308 Other specified dermatitis: Secondary | ICD-10-CM | POA: Diagnosis not present

## 2017-07-12 DIAGNOSIS — H43393 Other vitreous opacities, bilateral: Secondary | ICD-10-CM | POA: Diagnosis not present

## 2017-07-12 DIAGNOSIS — H35373 Puckering of macula, bilateral: Secondary | ICD-10-CM | POA: Diagnosis not present

## 2017-07-12 DIAGNOSIS — H25813 Combined forms of age-related cataract, bilateral: Secondary | ICD-10-CM | POA: Diagnosis not present

## 2017-07-18 DIAGNOSIS — L308 Other specified dermatitis: Secondary | ICD-10-CM | POA: Diagnosis not present

## 2017-07-18 DIAGNOSIS — D485 Neoplasm of uncertain behavior of skin: Secondary | ICD-10-CM | POA: Diagnosis not present

## 2017-07-18 DIAGNOSIS — C44729 Squamous cell carcinoma of skin of left lower limb, including hip: Secondary | ICD-10-CM | POA: Diagnosis not present

## 2017-07-24 DIAGNOSIS — E669 Obesity, unspecified: Secondary | ICD-10-CM | POA: Diagnosis not present

## 2017-07-24 DIAGNOSIS — N183 Chronic kidney disease, stage 3 (moderate): Secondary | ICD-10-CM | POA: Diagnosis not present

## 2017-07-24 DIAGNOSIS — Z79899 Other long term (current) drug therapy: Secondary | ICD-10-CM | POA: Diagnosis not present

## 2017-07-24 DIAGNOSIS — I129 Hypertensive chronic kidney disease with stage 1 through stage 4 chronic kidney disease, or unspecified chronic kidney disease: Secondary | ICD-10-CM | POA: Diagnosis not present

## 2017-07-24 DIAGNOSIS — F325 Major depressive disorder, single episode, in full remission: Secondary | ICD-10-CM | POA: Diagnosis not present

## 2017-07-24 DIAGNOSIS — Z0000 Encounter for general adult medical examination without abnormal findings: Secondary | ICD-10-CM | POA: Diagnosis not present

## 2017-07-24 DIAGNOSIS — Z6837 Body mass index (BMI) 37.0-37.9, adult: Secondary | ICD-10-CM | POA: Diagnosis not present

## 2017-08-16 DIAGNOSIS — R040 Epistaxis: Secondary | ICD-10-CM | POA: Diagnosis not present

## 2017-08-16 DIAGNOSIS — J3489 Other specified disorders of nose and nasal sinuses: Secondary | ICD-10-CM | POA: Diagnosis not present

## 2017-08-16 HISTORY — DX: Epistaxis: R04.0

## 2017-08-23 DIAGNOSIS — R040 Epistaxis: Secondary | ICD-10-CM | POA: Diagnosis not present

## 2017-08-23 DIAGNOSIS — M19072 Primary osteoarthritis, left ankle and foot: Secondary | ICD-10-CM | POA: Diagnosis not present

## 2017-08-23 DIAGNOSIS — J3489 Other specified disorders of nose and nasal sinuses: Secondary | ICD-10-CM | POA: Diagnosis not present

## 2017-08-23 DIAGNOSIS — M19071 Primary osteoarthritis, right ankle and foot: Secondary | ICD-10-CM | POA: Diagnosis not present

## 2017-08-23 DIAGNOSIS — J329 Chronic sinusitis, unspecified: Secondary | ICD-10-CM | POA: Diagnosis not present

## 2017-08-23 DIAGNOSIS — L403 Pustulosis palmaris et plantaris: Secondary | ICD-10-CM | POA: Diagnosis not present

## 2017-08-23 HISTORY — DX: Chronic sinusitis, unspecified: J32.9

## 2017-09-02 DIAGNOSIS — N200 Calculus of kidney: Secondary | ICD-10-CM

## 2017-09-02 HISTORY — DX: Calculus of kidney: N20.0

## 2017-09-03 DIAGNOSIS — N200 Calculus of kidney: Secondary | ICD-10-CM | POA: Diagnosis not present

## 2017-09-03 DIAGNOSIS — R350 Frequency of micturition: Secondary | ICD-10-CM | POA: Diagnosis not present

## 2017-09-03 DIAGNOSIS — R3915 Urgency of urination: Secondary | ICD-10-CM | POA: Diagnosis not present

## 2017-09-03 DIAGNOSIS — N401 Enlarged prostate with lower urinary tract symptoms: Secondary | ICD-10-CM | POA: Diagnosis not present

## 2017-09-03 DIAGNOSIS — R972 Elevated prostate specific antigen [PSA]: Secondary | ICD-10-CM | POA: Diagnosis not present

## 2017-09-03 DIAGNOSIS — L403 Pustulosis palmaris et plantaris: Secondary | ICD-10-CM | POA: Diagnosis not present

## 2017-09-03 DIAGNOSIS — N528 Other male erectile dysfunction: Secondary | ICD-10-CM | POA: Diagnosis not present

## 2017-09-06 DIAGNOSIS — R040 Epistaxis: Secondary | ICD-10-CM | POA: Diagnosis not present

## 2017-09-06 DIAGNOSIS — J329 Chronic sinusitis, unspecified: Secondary | ICD-10-CM | POA: Diagnosis not present

## 2017-09-06 DIAGNOSIS — J3489 Other specified disorders of nose and nasal sinuses: Secondary | ICD-10-CM | POA: Diagnosis not present

## 2017-09-24 DIAGNOSIS — L403 Pustulosis palmaris et plantaris: Secondary | ICD-10-CM | POA: Diagnosis not present

## 2017-09-25 DIAGNOSIS — Z85828 Personal history of other malignant neoplasm of skin: Secondary | ICD-10-CM | POA: Diagnosis not present

## 2017-09-25 DIAGNOSIS — L570 Actinic keratosis: Secondary | ICD-10-CM | POA: Diagnosis not present

## 2017-09-25 DIAGNOSIS — L308 Other specified dermatitis: Secondary | ICD-10-CM | POA: Diagnosis not present

## 2017-09-26 DIAGNOSIS — N183 Chronic kidney disease, stage 3 (moderate): Secondary | ICD-10-CM | POA: Diagnosis not present

## 2017-09-26 DIAGNOSIS — I129 Hypertensive chronic kidney disease with stage 1 through stage 4 chronic kidney disease, or unspecified chronic kidney disease: Secondary | ICD-10-CM | POA: Diagnosis not present

## 2017-10-18 DIAGNOSIS — J329 Chronic sinusitis, unspecified: Secondary | ICD-10-CM | POA: Diagnosis not present

## 2017-10-22 DIAGNOSIS — L403 Pustulosis palmaris et plantaris: Secondary | ICD-10-CM | POA: Diagnosis not present

## 2017-11-16 DIAGNOSIS — J329 Chronic sinusitis, unspecified: Secondary | ICD-10-CM | POA: Diagnosis not present

## 2017-11-20 DIAGNOSIS — F321 Major depressive disorder, single episode, moderate: Secondary | ICD-10-CM | POA: Diagnosis not present

## 2017-11-21 DIAGNOSIS — N183 Chronic kidney disease, stage 3 (moderate): Secondary | ICD-10-CM | POA: Diagnosis not present

## 2017-11-21 DIAGNOSIS — I129 Hypertensive chronic kidney disease with stage 1 through stage 4 chronic kidney disease, or unspecified chronic kidney disease: Secondary | ICD-10-CM | POA: Diagnosis not present

## 2017-11-21 DIAGNOSIS — Z6835 Body mass index (BMI) 35.0-35.9, adult: Secondary | ICD-10-CM | POA: Diagnosis not present

## 2017-12-26 DIAGNOSIS — J329 Chronic sinusitis, unspecified: Secondary | ICD-10-CM | POA: Diagnosis not present

## 2017-12-26 DIAGNOSIS — J328 Other chronic sinusitis: Secondary | ICD-10-CM | POA: Diagnosis not present

## 2018-01-04 DIAGNOSIS — Z85828 Personal history of other malignant neoplasm of skin: Secondary | ICD-10-CM | POA: Diagnosis not present

## 2018-01-04 DIAGNOSIS — C44729 Squamous cell carcinoma of skin of left lower limb, including hip: Secondary | ICD-10-CM | POA: Diagnosis not present

## 2018-01-21 ENCOUNTER — Other Ambulatory Visit: Payer: Self-pay | Admitting: Geriatric Medicine

## 2018-01-21 ENCOUNTER — Ambulatory Visit
Admission: RE | Admit: 2018-01-21 | Discharge: 2018-01-21 | Disposition: A | Discharge status: Home / Self Care | Payer: Medicare Other | Source: Ambulatory Visit | Attending: Geriatric Medicine | Admitting: Geriatric Medicine

## 2018-01-21 DIAGNOSIS — M19072 Primary osteoarthritis, left ankle and foot: Secondary | ICD-10-CM | POA: Diagnosis not present

## 2018-01-21 DIAGNOSIS — Z79899 Other long term (current) drug therapy: Secondary | ICD-10-CM | POA: Diagnosis not present

## 2018-01-21 DIAGNOSIS — M25572 Pain in left ankle and joints of left foot: Secondary | ICD-10-CM | POA: Diagnosis not present

## 2018-01-21 DIAGNOSIS — I129 Hypertensive chronic kidney disease with stage 1 through stage 4 chronic kidney disease, or unspecified chronic kidney disease: Secondary | ICD-10-CM | POA: Diagnosis not present

## 2018-01-21 DIAGNOSIS — N183 Chronic kidney disease, stage 3 (moderate): Secondary | ICD-10-CM | POA: Diagnosis not present

## 2018-02-04 DIAGNOSIS — J320 Chronic maxillary sinusitis: Secondary | ICD-10-CM | POA: Diagnosis not present

## 2018-02-04 DIAGNOSIS — J329 Chronic sinusitis, unspecified: Secondary | ICD-10-CM | POA: Diagnosis not present

## 2018-02-04 DIAGNOSIS — J322 Chronic ethmoidal sinusitis: Secondary | ICD-10-CM | POA: Diagnosis not present

## 2018-02-04 DIAGNOSIS — J321 Chronic frontal sinusitis: Secondary | ICD-10-CM | POA: Diagnosis not present

## 2018-03-05 DIAGNOSIS — J320 Chronic maxillary sinusitis: Secondary | ICD-10-CM | POA: Diagnosis not present

## 2018-03-05 DIAGNOSIS — Z7289 Other problems related to lifestyle: Secondary | ICD-10-CM | POA: Diagnosis not present

## 2018-03-05 DIAGNOSIS — J321 Chronic frontal sinusitis: Secondary | ICD-10-CM | POA: Diagnosis not present

## 2018-03-05 DIAGNOSIS — J322 Chronic ethmoidal sinusitis: Secondary | ICD-10-CM | POA: Diagnosis not present

## 2018-03-26 DIAGNOSIS — L308 Other specified dermatitis: Secondary | ICD-10-CM | POA: Diagnosis not present

## 2018-03-26 DIAGNOSIS — L2089 Other atopic dermatitis: Secondary | ICD-10-CM | POA: Diagnosis not present

## 2018-03-26 DIAGNOSIS — Z85828 Personal history of other malignant neoplasm of skin: Secondary | ICD-10-CM | POA: Diagnosis not present

## 2018-03-26 DIAGNOSIS — B353 Tinea pedis: Secondary | ICD-10-CM | POA: Diagnosis not present

## 2018-04-16 DIAGNOSIS — F321 Major depressive disorder, single episode, moderate: Secondary | ICD-10-CM

## 2018-04-16 DIAGNOSIS — F325 Major depressive disorder, single episode, in full remission: Secondary | ICD-10-CM | POA: Insufficient documentation

## 2018-04-17 ENCOUNTER — Ambulatory Visit (INDEPENDENT_AMBULATORY_CARE_PROVIDER_SITE_OTHER): Payer: Medicare Other | Admitting: Psychiatry

## 2018-04-17 ENCOUNTER — Encounter: Payer: Self-pay | Admitting: Psychiatry

## 2018-04-17 DIAGNOSIS — F331 Major depressive disorder, recurrent, moderate: Secondary | ICD-10-CM

## 2018-04-17 DIAGNOSIS — Z79899 Other long term (current) drug therapy: Secondary | ICD-10-CM

## 2018-04-17 NOTE — Progress Notes (Signed)
[facility]  Patient ID: Chase Mathis,  MRN: [id number]  PCP: Lajean Manes, MD  Date of Evaluation: 04/17/2018 Time spent:30 minutes urgent appt.   HISTORY/CURRENT STATUS: HPI CC Suddently last Wed felt a "meltdown".  Didn't feel good.  Start ruminating on fear of recurrence of depression.  Was fine on Tuesday.  Now pretty much back to normal.  Improving gradually over the days.  Went 4 nights without sleep DT anxiety.  Scared me.  Eat ok. Some worry about Son drinking too much playing music, became alcoholic.  Been going on for years but a little worse.  Went through a tx center.  Not sure if it was the trigger. May have missed some dosages.  Other sx were no energy and wanted to stay in the house.  That is largely resolved.  Had some rumination and fear that he would have to have a ECT again.  This is improving  Individual Medical History/ Review of Systems: Changes? :No  Allergies: Patient has no known allergies.  Current Medications:  Current Outpatient Medications:  .  amLODipine (NORVASC) 5 MG tablet, Take 5 mg by mouth daily., Disp: , Rfl:  .  aspirin EC 81 MG tablet, Take 81 mg by mouth daily., Disp: , Rfl:  .  finasteride (PROSCAR) 5 MG tablet, Take 1 tablet (5 mg total) by mouth daily., Disp: 90 tablet, Rfl: 3 .  lisinopril (PRINIVIL,ZESTRIL) 10 MG tablet, Take 10 mg by mouth daily., Disp: , Rfl:  .  lithium carbonate (LITHOBID) 300 MG CR tablet, Take 300 mg by mouth daily. , Disp: , Rfl:  .  nortriptyline (PAMELOR) 50 MG capsule, Take 50 mg by mouth 2 (two) times daily. , Disp: , Rfl:  .  HYDROcodone-acetaminophen (NORCO/VICODIN) 5-325 MG tablet, Take 1 tablet by mouth every 6 (six) hours as needed. (Patient not taking: Reported on 04/17/2018), Disp: 5 tablet, Rfl: 0 .  Ibuprofen-Diphenhydramine Cit (ADVIL PM) 200-38 MG TABS, Take 1 tablet by mouth daily as needed (pain)., Disp: , Rfl:  .  Meth-Hyo-M Bl-Na Phos-Ph Sal (URIBEL) 118 MG CAPS, Take 1 capsule (118 mg  total) by mouth 3 times/day as needed-between meals & bedtime. (Patient not taking: Reported on 12/19/2016), Disp: 30 capsule, Rfl: 3 .  oxyCODONE-acetaminophen (ROXICET) 5-325 MG per tablet, Take 1 tablet by mouth every 4 (four) hours as needed for severe pain. (Patient not taking: Reported on 12/19/2016), Disp: 30 tablet, Rfl: 0 .  tamsulosin (FLOMAX) 0.4 MG CAPS capsule, Take 1 capsule (0.4 mg total) by mouth daily. (Patient not taking: Reported on 12/19/2016), Disp: 90 capsule, Rfl: 3 .  trimethoprim (TRIMPEX) 100 MG tablet, Take 1 tablet (100 mg total) by mouth 1 day or 1 dose. (Patient not taking: Reported on 12/19/2016), Disp: 30 tablet, Rfl: 1 Medication Side Effects: None  Family Medical/ Social History: Changes? Yes Noted above.  MENTAL HEALTH EXAM:  There were no vitals taken for this visit.There is no height or weight on file to calculate BMI.  General Appearance: Neat  Eye Contact:  Good  Speech:  Normal Rate  Volume:  Normal  Mood:  Depressed  Affect:  Appropriate  Thought Process:  Goal Directed  Orientation:  Full (Time, Place, and Person)  Thought Content: Rumination   Suicidal Thoughts:  No  Homicidal Thoughts:  No  Memory:  Recent  Judgement:  Good  Insight:  Good  Psychomotor Activity:  Normal  Concentration:  Concentration: Good  Recall:  Good  Fund of Knowledge: Good  Language: Good  Akathisia:  No  AIMS (if indicated): not done  Assets:  Communication Skills Desire for Improvement Financial Resources/Insurance Housing Intimacy Leisure Time Physical Health Resilience Social Support Talents/Skills Transportation Vocational/Educational  ADL's:  Intact  Cognition: WNL  Prognosis:  Good    DIAGNOSES:    ICD-10-CM   1. Major depressive disorder, recurrent episode, moderate (HCC) F33.1     RECOMMENDATIONS:Greater than 50% of face to face time with patient was spent on counseling and coordination of care. 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  He is been stable since being on nortriptyline and lithium.  Last November we reduced the nortriptyline from 150 mg to 100 mg daily and reduce the lithium from 450 mg a day to 300 mg daily because of some tremor and because he wanted to try to reduce the medication.  His blood levels on the higher dosages were lithium 0.7 nortriptyline 103.  He has been stable since that time until this recent last week where he had some relapse of depression with anxiety but it appears to be resolving at this time.  We will check his nortriptyline and lithium level tomorrow.  Encouraged him to be consistent with the dosages.  May consider adjusting based on the blood levels.  Follow-up 4 weeks  Purnell Shoemaker, MD

## 2018-04-17 NOTE — Patient Instructions (Signed)
Get blood tests tomorrow morning

## 2018-04-18 DIAGNOSIS — Z79899 Other long term (current) drug therapy: Secondary | ICD-10-CM | POA: Diagnosis not present

## 2018-04-18 DIAGNOSIS — F331 Major depressive disorder, recurrent, moderate: Secondary | ICD-10-CM | POA: Diagnosis not present

## 2018-04-22 ENCOUNTER — Telehealth: Payer: Self-pay | Admitting: Psychiatry

## 2018-04-22 LAB — BASIC METABOLIC PANEL
BUN/Creatinine Ratio: 24 (calc) — ABNORMAL HIGH (ref 6–22)
BUN: 28 mg/dL — AB (ref 7–25)
CALCIUM: 9.7 mg/dL (ref 8.6–10.3)
CO2: 26 mmol/L (ref 20–32)
CREATININE: 1.18 mg/dL — AB (ref 0.70–1.11)
Chloride: 105 mmol/L (ref 98–110)
Glucose, Bld: 81 mg/dL (ref 65–139)
Potassium: 4.8 mmol/L (ref 3.5–5.3)
Sodium: 138 mmol/L (ref 135–146)

## 2018-04-22 LAB — AMITRIPTYLINE LEVEL
Amitriptyline and Nortriptyline: 224 mcg/L (ref 100–250)
NORTRIPTYLINE-AMITR: 224 ug/L

## 2018-04-22 LAB — TSH: TSH: 0.99 mIU/L (ref 0.40–4.50)

## 2018-04-22 LAB — LITHIUM LEVEL: LITHIUM LVL: 0.4 mmol/L — AB (ref 0.6–1.2)

## 2018-04-22 MED ORDER — LITHIUM CARBONATE 150 MG PO CAPS: 450.0000 mg | ORAL_CAPSULE | Freq: Every day | ORAL | 1 refills | Status: DC

## 2018-04-22 NOTE — Telephone Encounter (Signed)
Disc lab results.  Li 0.4.  Nortriptyline 224. Still ruminating some.  No room to increase nortriptyline .  Therefore increase Lithium back to 450 HS.  He agreed.   Call if any problems.

## 2018-04-23 DIAGNOSIS — I129 Hypertensive chronic kidney disease with stage 1 through stage 4 chronic kidney disease, or unspecified chronic kidney disease: Secondary | ICD-10-CM | POA: Diagnosis not present

## 2018-04-23 DIAGNOSIS — R269 Unspecified abnormalities of gait and mobility: Secondary | ICD-10-CM | POA: Diagnosis not present

## 2018-04-23 DIAGNOSIS — F330 Major depressive disorder, recurrent, mild: Secondary | ICD-10-CM | POA: Diagnosis not present

## 2018-04-23 DIAGNOSIS — E538 Deficiency of other specified B group vitamins: Secondary | ICD-10-CM | POA: Diagnosis not present

## 2018-04-23 DIAGNOSIS — N183 Chronic kidney disease, stage 3 (moderate): Secondary | ICD-10-CM | POA: Diagnosis not present

## 2018-04-24 ENCOUNTER — Encounter: Payer: Self-pay | Admitting: Neurology

## 2018-05-09 ENCOUNTER — Ambulatory Visit: Payer: Medicare Other | Admitting: Psychiatry

## 2018-05-10 ENCOUNTER — Encounter: Payer: Self-pay | Admitting: Psychiatry

## 2018-05-10 ENCOUNTER — Ambulatory Visit (INDEPENDENT_AMBULATORY_CARE_PROVIDER_SITE_OTHER): Payer: Medicare Other | Admitting: Psychiatry

## 2018-05-10 DIAGNOSIS — F3342 Major depressive disorder, recurrent, in full remission: Secondary | ICD-10-CM | POA: Diagnosis not present

## 2018-05-10 DIAGNOSIS — G251 Drug-induced tremor: Secondary | ICD-10-CM

## 2018-05-10 NOTE — Progress Notes (Signed)
Chase Mathis [id number] 1933-11-14 84 y.o.  Subjective:   Patient ID:  Chase Mathis is a 84 y.o. (DOB 1933/06/28) male.  Chief Complaint:  Chief Complaint  Patient presents with  . Depression  . Follow-up    med change    HPI Chase Mathis presents to the office today for follow-up of recurrence of depression.  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  He had an early relapse while on nortriptyline alone and lithium was added and he had booster ECTs and has been in remission since that time.  He has remained well until October of this year when his depression recurred.  Was ready to start ECT but bumping up the lithium level "did the trick". Recognizes the value of the lithium for his depression.  Dropping the dosage in the past was done bc of a tremor.We discussed Dr. Ronnie Mathis has a history of severe treatment resistant depression which responded to ECT.  He is been stable since being on nortriptyline and lithium.  Last November 2018, we reduced the nortriptyline from 150 mg to 100 mg daily and reduced the lithium from 450 mg a day to 300 mg daily because of some tremor and because he wanted to try to reduce the medication.  His blood levels on the higher dosages were lithium 0.7 nortriptyline 103.   On the lower dosages the level in October were:  Li 0.4.  Nortriptyline 224. As of the phone call with him on 10/29 he was Still ruminating some.  No room to increase nortriptyline, therefore we increased Lithium back to 450 HS.  He agreed.    In the depression recently probably didn't sleep for a week, with no energy, no motivation and hopelessness.  All of the symptoms have resolved since increasing the lithium back to 450 mg daily. His tremor has returned but the benefit is worth the tremor.  Review of Systems:  Review of Systems  Neurological: Positive for tremors. Negative for weakness.  Psychiatric/Behavioral: Negative for  agitation, behavioral problems, confusion, decreased concentration, dysphoric mood, hallucinations, self-injury, sleep disturbance and suicidal ideas. The patient is not nervous/anxious and is not hyperactive.     Medications: I have reviewed the patient's current medications.  Current Outpatient Medications  Medication Sig Dispense Refill  . amLODipine (NORVASC) 5 MG tablet Take 5 mg by mouth daily.    Marland Kitchen aspirin EC 81 MG tablet Take 81 mg by mouth daily.    . finasteride (PROSCAR) 5 MG tablet Take 1 tablet (5 mg total) by mouth daily. 90 tablet 3  . HYDROcodone-acetaminophen (NORCO/VICODIN) 5-325 MG tablet Take 1 tablet by mouth every 6 (six) hours as needed. 5 tablet 0  . Ibuprofen-Diphenhydramine Cit (ADVIL PM) 200-38 MG TABS Take 1 tablet by mouth daily as needed (pain).    Marland Kitchen lisinopril (PRINIVIL,ZESTRIL) 10 MG tablet Take 10 mg by mouth daily.    Marland Kitchen lithium carbonate 150 MG capsule Take 3 capsules (450 mg total) by mouth at bedtime. 90 capsule 1  . Meth-Hyo-M Bl-Na Phos-Ph Sal (URIBEL) 118 MG CAPS Take 1 capsule (118 mg total) by mouth 3 times/day as needed-between meals & bedtime. 30 capsule 3  . nortriptyline (PAMELOR) 50 MG capsule Take 100 mg by mouth at bedtime.     Marland Kitchen oxyCODONE-acetaminophen (ROXICET) 5-325 MG per tablet Take 1 tablet by mouth every 4 (four) hours as needed for severe pain. 30 tablet 0  . tamsulosin (FLOMAX) 0.4  MG CAPS capsule Take 1 capsule (0.4 mg total) by mouth daily. 90 capsule 3  . trimethoprim (TRIMPEX) 100 MG tablet Take 1 tablet (100 mg total) by mouth 1 day or 1 dose. 30 tablet 1   No current facility-administered medications for this visit.     Medication Side Effects: None  Allergies: No Known Allergies  Past Medical History:  Diagnosis Date  . Hypertension     History reviewed. No pertinent family history.  Social History   Socioeconomic History  . Marital status: Married    Spouse name: Not on file  . Number of children: Not on file  .  Years of education: Not on file  . Highest education level: Not on file  Occupational History  . Not on file  Social Needs  . Financial resource strain: Not on file  . Food insecurity:    Worry: Not on file    Inability: Not on file  . Transportation needs:    Medical: Not on file    Non-medical: Not on file  Tobacco Use  . Smoking status: Never Smoker  . Smokeless tobacco: Never Used  Substance and Sexual Activity  . Alcohol use: Yes    Comment: occasionally  . Drug use: No  . Sexual activity: Yes  Lifestyle  . Physical activity:    Days per week: Not on file    Minutes per session: Not on file  . Stress: Not on file  Relationships  . Social connections:    Talks on phone: Not on file    Gets together: Not on file    Attends religious service: Not on file    Active member of club or organization: Not on file    Attends meetings of clubs or organizations: Not on file    Relationship status: Not on file  . Intimate partner violence:    Fear of current or ex partner: Not on file    Emotionally abused: Not on file    Physically abused: Not on file    Forced sexual activity: Not on file  Other Topics Concern  . Not on file  Social History Narrative  . Not on file    Past Medical History, Surgical history, Social history, and Family history were reviewed and updated as appropriate.   Please see review of systems for further details on the patient's review from today.   Objective:   Physical Exam:  There were no vitals taken for this visit.  Physical Exam  Constitutional: He is oriented to person, place, and time. He appears well-developed. No distress.  Musculoskeletal: He exhibits no deformity.  Neurological: He is alert and oriented to person, place, and time. He displays tremor. Coordination and gait normal.  Tremor is resting fine motor in the hands and moderate.  Psychiatric: He has a normal mood and affect. His speech is normal and behavior is normal.  Judgment and thought content normal. His mood appears not anxious. His affect is not angry, not blunt, not labile and not inappropriate. Cognition and memory are normal. He does not exhibit a depressed mood. He expresses no homicidal and no suicidal ideation. He expresses no suicidal plans and no homicidal plans.  Insight intact. No auditory or visual hallucinations. No delusions.     Lab Review:     Component Value Date/Time   NA 138 04/18/2018 0937   K 4.8 04/18/2018 0937   CL 105 04/18/2018 0937   CO2 26 04/18/2018 0937   GLUCOSE 81 04/18/2018  0937   BUN 28 (H) 04/18/2018 0937   CREATININE 1.18 (H) 04/18/2018 0937   CALCIUM 9.7 04/18/2018 0937   PROT 7.5 12/19/2016 2211   ALBUMIN 4.4 12/19/2016 2211   AST 29 12/19/2016 2211   ALT 42 12/19/2016 2211   ALKPHOS 54 12/19/2016 2211   BILITOT 0.7 12/19/2016 2211   GFRNONAA 54 (L) 12/19/2016 2211   GFRAA >60 12/19/2016 2211       Component Value Date/Time   WBC 12.5 (H) 12/19/2016 2211   RBC 5.19 12/19/2016 2211   HGB 15.6 12/19/2016 2211   HCT 46.2 12/19/2016 2211   PLT 265 12/19/2016 2211   MCV 89.0 12/19/2016 2211   MCH 30.1 12/19/2016 2211   MCHC 33.8 12/19/2016 2211   RDW 14.1 12/19/2016 2211   LYMPHSABS 1.3 12/19/2016 2211   MONOABS 0.9 12/19/2016 2211   EOSABS 0.1 12/19/2016 2211   BASOSABS 0.0 12/19/2016 2211    Lithium Lvl  Date Value Ref Range Status  04/18/2018 0.4 (L) 0.6 - 1.2 mmol/L Final   Was on 300mg  daily .  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  He clearly needs this dosage of lithium and it should continue to be effective for him.  Discussed the Dr.Post articles on lithium.  Also reviewed 2 additional articles on lithium tremor in the  effectiveness of high dosage of B6 500 mg twice a day.  Gave him information on this.  Suggested he give this a try.  He is also going to be seeing a neurologist soon and will discuss this with the neurologist.  Also discussed the alternative of low-dose propranolol again for the tremor if needed.  His nortriptyline level is adequate and the dosage should probably not be increased because of his age and because of the level.  Discussed side effects of both these medications. Counseled patient regarding potential benefits, risks, and side effects of lithium to include potential risk of lithium affecting thyroid and renal function.  Discussed need for periodic lab monitoring to determine drug level and to assess for potential adverse effects.  Counseled patient regarding signs and symptoms of lithium toxicity and advised that they notify office immediately or seek urgent medical attention if experiencing these signs and symptoms.  Patient advised to contact office with any questions or concerns.  After a period of stability we could re-evaluate and consider reducing the nortriptyline as his blood level has gone up over the years as he has aged.  It would be premature at this time to do that.  This is a 30-minute appointment  Follow-up 3 months or sooner if there is any recurrence.  Lynder Parents MD, DFAPA  Please see After Visit Summary for patient specific instructions.  Future Appointments  Date Time Provider [place]  05/27/2018  1:00 PM Tat, Eustace Quail, DO [id number] None  08/09/2018  9:00 AM Cottle, Billey Co., MD [facility] None    No orders of the defined types were placed in this encounter.     -------------------------------

## 2018-05-13 ENCOUNTER — Other Ambulatory Visit: Payer: Self-pay

## 2018-05-13 ENCOUNTER — Ambulatory Visit: Payer: Medicare Other | Admitting: Psychiatry

## 2018-05-17 ENCOUNTER — Other Ambulatory Visit: Payer: Self-pay | Admitting: Psychiatry

## 2018-05-21 NOTE — Progress Notes (Signed)
Chase Mathis was seen today in the movement disorders clinic for neurologic consultation at the request of [place], Christiane Ha, MD.  The consultation is for the evaluation of shuffling gait that was new in onset in early october.  This patient is accompanied in the office by his spouse who supplements the history.   Specific Symptoms:  Tremor: Yes.   (on lithium which had to be increased recently.  Dr. Beryle Quant records reviewed.  His tremor did increase at the time but helped mood so felt that benefit outweighed SE).  Its an intention tremor.  Its not bothersome to the patient, except for turning pages in a choir book.  He also notes it on the keyboard when typing.   Family hx of similar:  No. Voice: no change per patient; wife thinks that it is a little lower than in the past Sleep: sleeps well per pt but wife states that he sleeps with advil PM  Vivid Dreams:  No.  Acting out dreams:  Wife states that he is active at night and he is "always fighting a battle." Wet Pillows: No. Postural symptoms:  Yes.  , poor stride length per patient x 3-4 months but wife says its been last few years but worse over the last few months.  Wife noting that some of issue is that shoes are poorly fitting.   Pt does think that much of shuffling has improved but wife states that "you shuffled straight into this building."  Falls?  No. Bradykinesia symptoms: shuffling gait, slow movements and difficulty getting out of a chair; was recently walking into football stadium and had trouble walking into stadium because "thighs wouldn't work" Loss of smell:  No. Loss of taste:  No. Urinary Incontinence:  No. Difficulty Swallowing:  No. Handwriting, micrographia: No. per patient but bigger per wife Trouble with ADL's:  No.  Trouble buttoning clothing: No. Depression:  Yes.  , better with lithium Memory changes:  No. Hallucinations:  No.  visual distortions: No. N/V:  No. Lightheaded:  No.  Syncope: No. Diplopia:   No. Dyskinesia:  No. Prior exposure to reglan/antipsychotics: No.  Neuroimaging of the brain has previously been performed.  MRI of the brain was done in 2010, but the report was not available.  I did review the films.  There are mild scattered T2 hyperintensities.   ALLERGIES:  No Known Allergies  CURRENT MEDICATIONS:  Outpatient Encounter Medications as of 05/27/2018  Medication Sig  . amLODipine (NORVASC) 5 MG tablet Take 5 mg by mouth daily.  . finasteride (PROSCAR) 5 MG tablet Take 1 tablet (5 mg total) by mouth daily.  . Ibuprofen-Diphenhydramine Cit (ADVIL PM) 200-38 MG TABS Take 1 tablet by mouth daily as needed (pain).  Marland Kitchen lisinopril (PRINIVIL,ZESTRIL) 10 MG tablet Take 10 mg by mouth daily.  Marland Kitchen lithium carbonate 150 MG capsule TAKE 3 CAPSULES BY MOUTH EVERY DAY AT BEDTIME (Patient taking differently: Take 300 mg by mouth at bedtime. )  . nortriptyline (PAMELOR) 50 MG capsule Take 100 mg by mouth at bedtime.   . tamsulosin (FLOMAX) 0.4 MG CAPS capsule Take 1 capsule (0.4 mg total) by mouth daily.  . [DISCONTINUED] aspirin EC 81 MG tablet Take 81 mg by mouth daily.  . [DISCONTINUED] HYDROcodone-acetaminophen (NORCO/VICODIN) 5-325 MG tablet Take 1 tablet by mouth every 6 (six) hours as needed.  . [DISCONTINUED] Meth-Hyo-M Bl-Na Phos-Ph Sal (URIBEL) 118 MG CAPS Take 1 capsule (118 mg total) by mouth 3 times/day as needed-between meals & bedtime.  . [  DISCONTINUED] oxyCODONE-acetaminophen (ROXICET) 5-325 MG per tablet Take 1 tablet by mouth every 4 (four) hours as needed for severe pain.  . [DISCONTINUED] trimethoprim (TRIMPEX) 100 MG tablet Take 1 tablet (100 mg total) by mouth 1 day or 1 dose.   No facility-administered encounter medications on file as of 05/27/2018.     PAST MEDICAL HISTORY:   Past Medical History:  Diagnosis Date  . BPH (benign prostatic hyperplasia)   . Depression   . Hypertension     PAST SURGICAL HISTORY:   Past Surgical History:  Procedure Laterality  Date  . CYSTOSCOPY WITH RETROGRADE PYELOGRAM, URETEROSCOPY AND STENT PLACEMENT Left 01/28/2015   Procedure: CYSTOSCOPY WITH LEFT RETROGRADE PYELOGRAM, LEFT URETEROSCOPY/STONE BASKETING  AND LEFT URETERAL  STENT PLACEMENT;  Surgeon: Carolan Clines, MD;  Location: [facility];  Service: Urology;  Laterality: Left;  . SINUS IRRIGATION      SOCIAL HISTORY:   Social History   Socioeconomic History  . Marital status: Married    Spouse name: Not on file  . Number of children: Not on file  . Years of education: Not on file  . Highest education level: Not on file  Occupational History  . Not on file  Social Needs  . Financial resource strain: Not on file  . Food insecurity:    Worry: Not on file    Inability: Not on file  . Transportation needs:    Medical: Not on file    Non-medical: Not on file  Tobacco Use  . Smoking status: Never Smoker  . Smokeless tobacco: Never Used  Substance and Sexual Activity  . Alcohol use: Yes    Comment: very rare  . Drug use: No  . Sexual activity: Yes  Lifestyle  . Physical activity:    Days per week: Not on file    Minutes per session: Not on file  . Stress: Not on file  Relationships  . Social connections:    Talks on phone: Not on file    Gets together: Not on file    Attends religious service: Not on file    Active member of club or organization: Not on file    Attends meetings of clubs or organizations: Not on file    Relationship status: Not on file  . Intimate partner violence:    Fear of current or ex partner: Not on file    Emotionally abused: Not on file    Physically abused: Not on file    Forced sexual activity: Not on file  Other Topics Concern  . Not on file  Social History Narrative  . Not on file    FAMILY HISTORY:   Family Status  Relation Name Status  . Mother  Deceased  . Father  Deceased  . Sister  Deceased  . Son Social research officer, government  . Daughter  Alive    ROS:  Review of Systems  Constitutional: Positive for  malaise/fatigue.  HENT: Negative.   Eyes:       Some vision change, but has difficulty describing it.  Has an appointment with ophthalmology soon.  Respiratory: Negative.   Cardiovascular: Negative.   Genitourinary: Negative.   Musculoskeletal: Negative.   Skin: Negative.   Neurological: Positive for tremors.  Psychiatric/Behavioral: Positive for depression (Markedly improved on increased lithium dose).    PHYSICAL EXAMINATION:    VITALS:   Vitals:   05/27/18 1251  BP: (!) 148/64  Pulse: 82  SpO2: 95%  Weight: 227 lb (103 kg)  Height: 5'  8" (1.727 m)    GEN:  The patient appears stated age and is in NAD. HEENT:  Normocephalic, atraumatic.  The mucous membranes are moist. The superficial temporal arteries are without ropiness or tenderness. CV:  RRR Lungs:  CTAB Neck/HEME:  There are no carotid bruits bilaterally.  Neurological examination:  Orientation: The patient is alert and oriented x3. Fund of knowledge is appropriate.  Recent and remote memory are intact.  Attention and concentration are normal.    Able to name objects and repeat phrases. Cranial nerves: There is good facial symmetry. Pupils are equal round and reactive to light bilaterally. Fundoscopic exam reveals clear margins bilaterally. Extraocular muscles are intact. The visual fields are full to confrontational testing. The speech is fluent and clear. Soft palate rises symmetrically and there is no tongue deviation. Hearing is intact to conversational tone. Sensation: Sensation is intact to light and pinprick throughout (facial, trunk, extremities). Vibration is decreased at the bilateral big toe and ankle. There is no extinction with double simultaneous stimulation. There is no sensory dermatomal level identified. Motor: Strength is 5/5 in the bilateral upper and lower extremities.   Shoulder shrug is equal and symmetric.  There is no pronator drift. Deep tendon reflexes: Deep tendon reflexes are 2/4 at the  bilateral biceps, triceps, brachioradialis, 1/4 at the bilateral patella and absent at the bilateral achilles. Plantar responses are downgoing bilaterally.  Movement examination: Tone: There is normal tone in the bilateral upper extremities.  The tone in the lower extremities is normal.  He does, however, have significant difficulty relaxing Abnormal movements: There is no rest tremor.  He does, however, having tension and postural tremor.  It is very mild. Coordination:  There is no decremation with RAM's, with any form of RAMS, including alternating supination and pronation of the forearm, hand opening and closing, finger taps, heel taps and toe taps. Gait and Station: The patient has no difficulty arising out of a deep-seated chair without the use of the hands. The patient's stride length is good, although his wife states that this is not the way he walks at home and that he is showing off.  He has a negative pull test.  Labs: Lab work is reviewed.  Lab work was dated April 23, 2018.  B12 is low at 149 (patient had apparently seen Dr. Erling Cruz years ago for B12 deficiency and took shots for 5 years and then stopped them).  On April 30, 2017, sodium is 138, potassium 4.9, chloride 102, CO2 29, BUN 19, creatinine 1.220, TSH 3.49.  Last lithium level was also done April 30, 2017 and that was 0.7.  Last hemoglobin A1c was done March 05, 2017 and was 6.0.  ASSESSMENT/PLAN:  1.  Gait instabity  -I don't see evidence of parkinsons disease.  I do think that peripheral neuropathy contributes.  His B12 deficiency could contribute to that, if not be causative.  I will check for other causes of peripheral neuropathy, although must have been checked for.  He will have a SPEP/UPEP with immunofixation.   -I will go ahead and do an MRI of the brain just to make sure we are not missing anything, but I think the likelihood of that is low.  I suspect we will see small vessel disease and likely some  atrophy.  -Family wanted him to hold on driving after he had some difficulty walking out of football game.  He asked me about this today.  He was agreeable to a driving evaluation.  We will schedule this through [facility].  2.  B12 deficiency  -Patient's B12 was low recently and has a history of the same.  Recommended B12, 1000 mcg daily.  Would also recommend checking homocystine and methylmalonic acid.  3.  Tremor  -This is likely due to the lithium.  Studies are varied, but incidate that up to 2/3 of patients on lithium will have lithium-induced tremor, even if the patients are not toxic on the medication.  This is just a common side effect of patients on lithium.  It doesn't particularly bother him and is very mild.  The benefits of the medication outweigh the SE of tremor.  4.  I told the patient that I would like to follow him up in 6 months, even if the above is negative, as his wife describes shuffling and possibly REM behavior disorder and I just want to make sure nothing is developing.  He is certainly welcome back before that time if new issues develop before then.   Cc:  Lajean Manes, MD

## 2018-05-22 DIAGNOSIS — N183 Chronic kidney disease, stage 3 (moderate): Secondary | ICD-10-CM | POA: Diagnosis not present

## 2018-05-22 DIAGNOSIS — R269 Unspecified abnormalities of gait and mobility: Secondary | ICD-10-CM | POA: Diagnosis not present

## 2018-05-22 DIAGNOSIS — E538 Deficiency of other specified B group vitamins: Secondary | ICD-10-CM | POA: Diagnosis not present

## 2018-05-22 DIAGNOSIS — I129 Hypertensive chronic kidney disease with stage 1 through stage 4 chronic kidney disease, or unspecified chronic kidney disease: Secondary | ICD-10-CM | POA: Diagnosis not present

## 2018-05-27 ENCOUNTER — Ambulatory Visit (INDEPENDENT_AMBULATORY_CARE_PROVIDER_SITE_OTHER): Payer: Medicare Other | Admitting: Neurology

## 2018-05-27 ENCOUNTER — Encounter: Payer: Self-pay | Admitting: Neurology

## 2018-05-27 VITALS — BP 148/64 | HR 82 | Ht 68.0 in | Wt 227.0 lb

## 2018-05-27 DIAGNOSIS — G609 Hereditary and idiopathic neuropathy, unspecified: Secondary | ICD-10-CM

## 2018-05-27 DIAGNOSIS — G251 Drug-induced tremor: Secondary | ICD-10-CM

## 2018-05-27 DIAGNOSIS — E538 Deficiency of other specified B group vitamins: Secondary | ICD-10-CM

## 2018-05-27 DIAGNOSIS — R270 Ataxia, unspecified: Secondary | ICD-10-CM | POA: Diagnosis not present

## 2018-05-27 NOTE — Patient Instructions (Signed)
1.  Start B12 - 1047micrograms daily  2.  We will send a referral to the [facility] driving program  3.  We will schedule the MRI brain.  Call us if they don't call you to schedule this within the next week or so.    4.  I want to see you in the next 6 months

## 2018-05-28 ENCOUNTER — Telehealth: Payer: Self-pay | Admitting: Neurology

## 2018-05-28 NOTE — Telephone Encounter (Signed)
Referral sent to [place] (occupational therapy) at fax [phone] with confirmation received. They will contact patient to schedule.

## 2018-05-29 ENCOUNTER — Telehealth: Payer: Self-pay | Admitting: Neurology

## 2018-05-29 NOTE — Telephone Encounter (Signed)
Left message on machine for patient to call back.  To let patient know we need to have labs drawn on him. Awaiting call back to discuss.

## 2018-05-31 ENCOUNTER — Encounter: Payer: Self-pay | Admitting: Neurology

## 2018-05-31 NOTE — Telephone Encounter (Signed)
Letter and lab req mailed to patient.

## 2018-06-04 ENCOUNTER — Ambulatory Visit
Admission: RE | Admit: 2018-06-04 | Discharge: 2018-06-04 | Disposition: A | Discharge status: Home / Self Care | Payer: Medicare Other | Source: Ambulatory Visit | Attending: Neurology | Admitting: Neurology

## 2018-06-04 ENCOUNTER — Telehealth: Payer: Self-pay | Admitting: Neurology

## 2018-06-04 DIAGNOSIS — R270 Ataxia, unspecified: Secondary | ICD-10-CM

## 2018-06-04 DIAGNOSIS — E538 Deficiency of other specified B group vitamins: Secondary | ICD-10-CM

## 2018-06-04 DIAGNOSIS — G609 Hereditary and idiopathic neuropathy, unspecified: Secondary | ICD-10-CM

## 2018-06-04 NOTE — Telephone Encounter (Signed)
Patient made aware of results.  

## 2018-06-04 NOTE — Telephone Encounter (Signed)
-----   Message from [place], DO sent at 06/04/2018 12:01 PM EST ----- Let pt know that there is mild small vessel disease (also seen on previous scan) and some mild atrophy but otherwise MRI looks fine and no real reason for symptoms

## 2018-07-29 DIAGNOSIS — F325 Major depressive disorder, single episode, in full remission: Secondary | ICD-10-CM | POA: Diagnosis not present

## 2018-07-29 DIAGNOSIS — Z79899 Other long term (current) drug therapy: Secondary | ICD-10-CM | POA: Diagnosis not present

## 2018-07-29 DIAGNOSIS — N183 Chronic kidney disease, stage 3 (moderate): Secondary | ICD-10-CM | POA: Diagnosis not present

## 2018-07-29 DIAGNOSIS — G629 Polyneuropathy, unspecified: Secondary | ICD-10-CM | POA: Diagnosis not present

## 2018-07-29 DIAGNOSIS — I129 Hypertensive chronic kidney disease with stage 1 through stage 4 chronic kidney disease, or unspecified chronic kidney disease: Secondary | ICD-10-CM | POA: Diagnosis not present

## 2018-07-29 DIAGNOSIS — Z1389 Encounter for screening for other disorder: Secondary | ICD-10-CM | POA: Diagnosis not present

## 2018-07-29 DIAGNOSIS — Z6835 Body mass index (BMI) 35.0-35.9, adult: Secondary | ICD-10-CM | POA: Diagnosis not present

## 2018-07-29 DIAGNOSIS — Z0000 Encounter for general adult medical examination without abnormal findings: Secondary | ICD-10-CM | POA: Diagnosis not present

## 2018-08-09 ENCOUNTER — Ambulatory Visit: Payer: Medicare Other | Admitting: Psychiatry

## 2018-08-27 DIAGNOSIS — L986 Other infiltrative disorders of the skin and subcutaneous tissue: Secondary | ICD-10-CM | POA: Diagnosis not present

## 2018-08-27 DIAGNOSIS — L308 Other specified dermatitis: Secondary | ICD-10-CM | POA: Diagnosis not present

## 2018-08-27 DIAGNOSIS — Z85828 Personal history of other malignant neoplasm of skin: Secondary | ICD-10-CM | POA: Diagnosis not present

## 2018-08-27 DIAGNOSIS — R21 Rash and other nonspecific skin eruption: Secondary | ICD-10-CM | POA: Diagnosis not present

## 2018-09-02 DIAGNOSIS — R972 Elevated prostate specific antigen [PSA]: Secondary | ICD-10-CM | POA: Diagnosis not present

## 2018-09-02 DIAGNOSIS — R3915 Urgency of urination: Secondary | ICD-10-CM | POA: Diagnosis not present

## 2018-09-02 DIAGNOSIS — R350 Frequency of micturition: Secondary | ICD-10-CM | POA: Diagnosis not present

## 2018-09-02 DIAGNOSIS — N401 Enlarged prostate with lower urinary tract symptoms: Secondary | ICD-10-CM | POA: Diagnosis not present

## 2018-09-02 DIAGNOSIS — N200 Calculus of kidney: Secondary | ICD-10-CM | POA: Diagnosis not present

## 2018-09-02 DIAGNOSIS — N529 Male erectile dysfunction, unspecified: Secondary | ICD-10-CM | POA: Diagnosis not present

## 2018-09-16 ENCOUNTER — Ambulatory Visit: Payer: Medicare Other | Admitting: Psychiatry

## 2018-09-21 ENCOUNTER — Other Ambulatory Visit: Payer: Self-pay | Admitting: Psychiatry

## 2018-11-25 NOTE — Progress Notes (Signed)
Virtual Visit via Phone Note (patient was scheduled for video visit, but when we went to check him then, he declined the video visit and requested phone visit). The purpose of this virtual visit is to provide medical care while limiting exposure to the novel coronavirus.    Consent was obtained for phone visit:  Yes.   Answered questions that patient had about telephone interaction:  Yes.   I discussed the limitations, risks, security and privacy concerns of performing an evaluation and management service by telephone. I also discussed with the patient that there may be a patient responsible charge related to this service. The patient expressed understanding and agreed to proceed.  Pt location: Home Physician Location: office Name of referring provider:  Lajean Manes, MD I connected with Chase Mathis at patients initiation/request on 11/27/2018 at  9:45 AM EDT by telephone application and verified that I am speaking with the correct person using two identifiers. Pt MRN:  [id number] Pt DOB:  July 01, 1933 Video Participants:  Chase Mathis;     History of Present Illness:  Patient seen today in follow-up for gait instability/possible shuffling (not seen on my examination last visit).  I thought that the biggest issue was peripheral neuropathy/B12 deficiency.  I recommended that he started on B12 supplement and he states that he is doing that.  I wanted the patient to get some lab work done for me since our last visit and I mailed him a requisition form, but I never got a copy of that lab work.  He reports that he did get the paper and just didn't get it done.  patient did have a driving evaluation on Nov 20, 2018.  I have received a copy of that and have reviewed it.  Patient did well on that, and demonstrated safe driving and it was recommended that he continue with independent driving in all levels of traffic without restriction.  He will need to did return for follow-up if there is a documented change  or decline in cognitive, physical or visual condition.  MRI of the brain was completed since last visit.   This was done in December, 2019.  This demonstrated mild small vessel disease and mild atrophy.  Has had no problems walking - "I am walking close to a mile a day and I can tell its helping."   Current Outpatient Medications on File Prior to Visit  Medication Sig Dispense Refill   amLODipine (NORVASC) 5 MG tablet Take 5 mg by mouth daily.     finasteride (PROSCAR) 5 MG tablet Take 1 tablet (5 mg total) by mouth daily. 90 tablet 3   lisinopril (PRINIVIL,ZESTRIL) 10 MG tablet Take 10 mg by mouth daily.     lithium carbonate 150 MG capsule TAKE 3 CAPSULES BY MOUTH EVERY DAY AT BEDTIME 270 capsule 1   nortriptyline (PAMELOR) 50 MG capsule Take 100 mg by mouth at bedtime.      No current facility-administered medications on file prior to visit.      Observations/Objective:   Vitals:   11/27/18 0846  Weight: 216 lb (98 kg)  Height: 5\' 8"  (1.727 m)   Unable, as patient declined video visit and resulted in phone visit only  Labs: I contacted his primary care physician and was able to receive a copy of lab work.  Last labs were done July 29, 2018.  White blood cells were 9.1, hemoglobin 14.6, hematocrit 44.5 and platelets 266.  Sodium was 140, potassium 4.9, chloride 107, CO2 28,  BUN 22, creatinine 1.19, glucose 96, AST 16, ALT 19, alkaline phosphatase 78.  B12 was last done in October, 2019 and that was 149.   Assessment and Plan:   1.  Gait instabity             -I don't see evidence of parkinsons disease.  I do think that peripheral neuropathy contributes.  His B12 deficiency could contribute to that, if not be causative.    He has not gotten the lab work yet that we sent him last visit, so we will reorder that.  We will do SPEP/UPEP with immunofixation.             -Family wanted him to hold on driving after he had some difficulty walking out of football game.    He completed a  driving evaluation through [facility], 2020 and this was normal and no restrictions were recommended.  2.  B12 deficiency             -Patient on supplement.  Will recheck to see if still low.  Will also check  homocystine and methylmalonic acid.  3.  Tremor             -This is likely due to the lithium.  Studies are varied, but incidate that up to 2/3 of patients on lithium will have lithium-induced tremor, even if the patients are not toxic on the medication.  This is just a common side effect of patients on lithium.  It doesn't particularly bother him and is very mild.  The benefits of the medication outweigh the SE of tremor.  Follow Up Instructions: If lab work is unremarkable, we will just see him on an as-needed basis.    -I discussed the assessment and treatment plan with the patient. The patient was provided an opportunity to ask questions and all were answered. The patient agreed with the plan and demonstrated an understanding of the instructions.   The patient was advised to call back or seek an in-person evaluation if the symptoms worsen or if the condition fails to improve as anticipated.    Total Time spent in visit with the patient was:  8 min.   Pt understands and agrees with the plan of care outlined.     Alonza Bogus, DO

## 2018-11-26 ENCOUNTER — Ambulatory Visit: Payer: Self-pay | Admitting: Neurology

## 2018-11-27 ENCOUNTER — Other Ambulatory Visit: Payer: Self-pay

## 2018-11-27 ENCOUNTER — Telehealth (INDEPENDENT_AMBULATORY_CARE_PROVIDER_SITE_OTHER): Payer: Medicare Other | Admitting: Neurology

## 2018-11-27 ENCOUNTER — Encounter: Payer: Self-pay | Admitting: Neurology

## 2018-11-27 DIAGNOSIS — E538 Deficiency of other specified B group vitamins: Secondary | ICD-10-CM

## 2018-11-27 DIAGNOSIS — G609 Hereditary and idiopathic neuropathy, unspecified: Secondary | ICD-10-CM

## 2018-11-27 DIAGNOSIS — G251 Drug-induced tremor: Secondary | ICD-10-CM | POA: Diagnosis not present

## 2018-11-27 NOTE — Progress Notes (Signed)
Reorder labs that's was never done on 05/27/18 plus added B-12 order  Per dr. Carles Collet staff message from 11/27/18 Pt seen via phone visit today Pt aware that labs has been reorder and to come to lab to have drawn. Check in here then downstairs to lab [address]

## 2018-12-20 DIAGNOSIS — L570 Actinic keratosis: Secondary | ICD-10-CM | POA: Diagnosis not present

## 2018-12-20 DIAGNOSIS — L308 Other specified dermatitis: Secondary | ICD-10-CM | POA: Diagnosis not present

## 2018-12-20 DIAGNOSIS — L821 Other seborrheic keratosis: Secondary | ICD-10-CM | POA: Diagnosis not present

## 2018-12-20 DIAGNOSIS — Z85828 Personal history of other malignant neoplasm of skin: Secondary | ICD-10-CM | POA: Diagnosis not present

## 2018-12-23 ENCOUNTER — Telehealth: Payer: Self-pay | Admitting: Psychiatry

## 2018-12-23 NOTE — Telephone Encounter (Signed)
Don called to request refill of his nortripytline.  Appt 02/18/19.  Send to [facility] [address].

## 2018-12-23 NOTE — Telephone Encounter (Signed)
Need to review paper chart, not seen in epic yet.

## 2018-12-24 ENCOUNTER — Other Ambulatory Visit: Payer: Self-pay

## 2018-12-24 MED ORDER — NORTRIPTYLINE HCL 50 MG PO CAPS: 100.0000 mg | ORAL_CAPSULE | Freq: Every day | ORAL | 0 refills | Status: DC

## 2018-12-24 NOTE — Telephone Encounter (Signed)
Refill submitted. 

## 2019-02-12 DIAGNOSIS — N183 Chronic kidney disease, stage 3 (moderate): Secondary | ICD-10-CM | POA: Diagnosis not present

## 2019-02-12 DIAGNOSIS — I129 Hypertensive chronic kidney disease with stage 1 through stage 4 chronic kidney disease, or unspecified chronic kidney disease: Secondary | ICD-10-CM | POA: Diagnosis not present

## 2019-02-18 ENCOUNTER — Ambulatory Visit: Payer: Medicare Other | Admitting: Psychiatry

## 2019-02-24 ENCOUNTER — Other Ambulatory Visit: Payer: Self-pay | Admitting: Psychiatry

## 2019-03-17 DIAGNOSIS — Z23 Encounter for immunization: Secondary | ICD-10-CM | POA: Diagnosis not present

## 2019-03-19 ENCOUNTER — Encounter: Payer: Self-pay | Admitting: Psychiatry

## 2019-03-19 ENCOUNTER — Ambulatory Visit (INDEPENDENT_AMBULATORY_CARE_PROVIDER_SITE_OTHER): Payer: Medicare Other | Admitting: Psychiatry

## 2019-03-19 ENCOUNTER — Other Ambulatory Visit: Payer: Self-pay

## 2019-03-19 DIAGNOSIS — Z79899 Other long term (current) drug therapy: Secondary | ICD-10-CM | POA: Diagnosis not present

## 2019-03-19 DIAGNOSIS — F3342 Major depressive disorder, recurrent, in full remission: Secondary | ICD-10-CM

## 2019-03-19 DIAGNOSIS — G251 Drug-induced tremor: Secondary | ICD-10-CM | POA: Diagnosis not present

## 2019-03-19 MED ORDER — LITHIUM CARBONATE 150 MG PO CAPS: 450.0000 mg | ORAL_CAPSULE | Freq: Every day | ORAL | 3 refills | Status: DC

## 2019-03-19 MED ORDER — NORTRIPTYLINE HCL 50 MG PO CAPS: 100.0000 mg | ORAL_CAPSULE | Freq: Every day | ORAL | 3 refills | Status: DC

## 2019-03-19 NOTE — Progress Notes (Signed)
[name] [id number] [birth year] 85 y.o.  Subjective:   Patient ID:  Chase Mathis is a 85 y.o. (DOB 1933/08/03) male.  Chief Complaint:  Chief Complaint  Patient presents with  . Follow-up    Medication Management  . Depression    Medication Management    Depression        Associated symptoms include no decreased concentration and no suicidal ideas.  [name] presents to the office today for follow-up of recurrence of depression that occurred at the end of 2019.  He responded to an increase in lithium from 300 to 450 mg daily..  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  He had an early relapse while on nortriptyline alone and lithium was added and he had booster ECTs and has been in remission since that time.  Last seen in November 2019.  His depression had gone into remission again with increase in lithium from 300 to 450 mg daily.  No med changes were made at that time.  I'm fine now.  Remained in remission.  Patient reports stable mood and denies depressed or irritable moods.  Patient denies any recent difficulty with anxiety.  Patient denies difficulty with sleep initiation but occ maintenance with work dreams with anxiety.  Benadryl helps. Denies appetite disturbance.  Patient reports that energy and motivation have been good.  Patient denies any difficulty with concentration.  Patient denies any suicidal ideation.  Minimal tremor.  Past Psychiatric Medication Trials:  ECT  Last November 2018, we reduced the nortriptyline from 150 mg to 100 mg daily and reduced the lithium from 450 mg a day to 300 mg daily because of some tremor and because he wanted to try to reduce the medication.  His blood levels on the higher dosages were lithium 0.7 nortriptyline 103.   On the lower dosages the level in October were:  Li 0.4.  Nortriptyline 224.  Review of Systems:  Review of Systems  Neurological: Negative for  tremors and weakness.  Psychiatric/Behavioral: Positive for depression. Negative for agitation, behavioral problems, confusion, decreased concentration, dysphoric mood, hallucinations, self-injury, sleep disturbance and suicidal ideas. The patient is not nervous/anxious and is not hyperactive.     Medications: I have reviewed the patient's current medications.  Current Outpatient Medications  Medication Sig Dispense Refill  . amLODipine (NORVASC) 5 MG tablet Take 5 mg by mouth daily.    . finasteride (PROSCAR) 5 MG tablet Take 1 tablet (5 mg total) by mouth daily. 90 tablet 3  . lisinopril (PRINIVIL,ZESTRIL) 10 MG tablet Take 20 mg by mouth daily.     Marland Kitchen lithium carbonate 150 MG capsule Take 3 capsules (450 mg total) by mouth at bedtime. 270 capsule 3  . nortriptyline (PAMELOR) 50 MG capsule Take 2 capsules (100 mg total) by mouth at bedtime. 180 capsule 3   No current facility-administered medications for this visit.     Medication Side Effects: None  Allergies: No Known Allergies  Past Medical History:  Diagnosis Date  . BPH (benign prostatic hyperplasia)   . Depression   . Hypertension     Family History  Problem Relation Age of Onset  . Lung cancer Mother   . Heart disease Father   . Pancreatic cancer Sister   . Healthy Son   . Healthy Daughter     Social History   Socioeconomic History  . Marital status: Married    Spouse name: Not on file  .  Number of children: Not on file  . Years of education: Not on file  . Highest education level: Not on file  Occupational History  . Occupation: retired    Comment: urologist  Social Needs  . Financial resource strain: Not on file  . Food insecurity    Worry: Not on file    Inability: Not on file  . Transportation needs    Medical: Not on file    Non-medical: Not on file  Tobacco Use  . Smoking status: Never Smoker  . Smokeless tobacco: Never Used  Substance and Sexual Activity  . Alcohol use: Yes    Comment: very  rare  . Drug use: No  . Sexual activity: Yes  Lifestyle  . Physical activity    Days per week: Not on file    Minutes per session: Not on file  . Stress: Not on file  Relationships  . Social Herbalist on phone: Not on file    Gets together: Not on file    Attends religious service: Not on file    Active member of club or organization: Not on file    Attends meetings of clubs or organizations: Not on file    Relationship status: Not on file  . Intimate partner violence    Fear of current or ex partner: Not on file    Emotionally abused: Not on file    Physically abused: Not on file    Forced sexual activity: Not on file  Other Topics Concern  . Not on file  Social History Narrative  . Not on file    Past Medical History, Surgical history, Social history, and Family history were reviewed and updated as appropriate.   Please see review of systems for further details on the patient's review from today.   Objective:   Physical Exam:  There were no vitals taken for this visit.  Physical Exam Constitutional:      General: He is not in acute distress.    Appearance: He is well-developed.  Musculoskeletal:        General: No deformity.  Neurological:     Mental Status: He is alert and oriented to person, place, and time.     Motor: No tremor.     Coordination: Coordination normal.     Gait: Gait normal.  Psychiatric:        Attention and Perception: Attention and perception normal.        Mood and Affect: Mood is not anxious or depressed. Affect is not labile, blunt, angry or inappropriate.        Speech: Speech normal.        Behavior: Behavior normal.        Thought Content: Thought content normal. Thought content does not include homicidal or suicidal ideation. Thought content does not include homicidal or suicidal plan.        Cognition and Memory: Cognition normal.        Judgment: Judgment normal.     Comments: Insight intact. No auditory or visual  hallucinations. No delusions.      Lab Review:     Component Value Date/Time   NA 138 04/18/2018 0937   K 4.8 04/18/2018 0937   CL 105 04/18/2018 0937   CO2 26 04/18/2018 0937   GLUCOSE 81 04/18/2018 0937   BUN 28 (H) 04/18/2018 0937   CREATININE 1.18 (H) 04/18/2018 0937   CALCIUM 9.7 04/18/2018 0937   PROT 7.5 12/19/2016 2211  ALBUMIN 4.4 12/19/2016 2211   AST 29 12/19/2016 2211   ALT 42 12/19/2016 2211   ALKPHOS 54 12/19/2016 2211   BILITOT 0.7 12/19/2016 2211   GFRNONAA 54 (L) 12/19/2016 2211   GFRAA >60 12/19/2016 2211       Component Value Date/Time   WBC 12.5 (H) 12/19/2016 2211   RBC 5.19 12/19/2016 2211   HGB 15.6 12/19/2016 2211   HCT 46.2 12/19/2016 2211   PLT 265 12/19/2016 2211   MCV 89.0 12/19/2016 2211   MCH 30.1 12/19/2016 2211   MCHC 33.8 12/19/2016 2211   RDW 14.1 12/19/2016 2211   LYMPHSABS 1.3 12/19/2016 2211   MONOABS 0.9 12/19/2016 2211   EOSABS 0.1 12/19/2016 2211   BASOSABS 0.0 12/19/2016 2211    Lithium Lvl  Date Value Ref Range Status  04/18/2018 0.4 (L) 0.6 - 1.2 mmol/L Final   Was on 300mg  daily .  No results found for: PHENYTOIN, PHENOBARB, VALPROATE, CBMZ   .res Assessment: Plan:    Recurrent major depression in full remission ([place]) - Plan: Lithium level, Basic metabolic panel, TSH, lithium carbonate 150 MG capsule, nortriptyline (PAMELOR) 50 MG capsule  Lithium-induced tremor  Lithium use - Plan: Lithium level, Basic metabolic panel, TSH   Fortunately Dr. Ruel Favors depression has remitted fairly quickly after the addition of the extra lithium from 300mg  daily to 450mg  daily in the fall 2019.  He clearly needs this dosage of lithium and it should continue to be effective for him.  Discussed the Dr.Post articles on lithium.  He was having tremor at the last visit but it resolved with time and no treatment needed.  His nortriptyline level is adequate and the dosage should probably not be increased because of his age and because of  the level.  Discussed side effects of both these medications. Counseled patient regarding potential benefits, risks, and side effects of lithium to include potential risk of lithium affecting thyroid and renal function.  Discussed need for periodic lab monitoring to determine drug level and to assess for potential adverse effects.  Counseled patient regarding signs and symptoms of lithium toxicity and advised that they notify office immediately or seek urgent medical attention if experiencing these signs and symptoms.  Patient advised to contact office with any questions or concerns. Get lithium level, TSH and BMP.  Because of good response and a prior history of relapse no med changes should be pursued. Continue lithium 150 mg capsules 3 daily Continue nortriptyline 50 mg capsules 2 nightly  Discussed side effects of nortriptyline which could include anticholinergic side effects because of his age could include symptoms like confusion.  He has no such side effects at this time.  Contact us if you have the symptoms or any other symptoms such as sedation etc.  This is a 30-minute appointment  Follow-up 9 months or sooner if there is any recurrence.  Lynder Parents MD, DFAPA  Please see After Visit Summary for patient specific instructions.  Future Appointments  Date Time Provider [place]  03/18/2020  3:00 PM Cottle, Billey Co., MD CP-CP None    Orders Placed This Encounter  Procedures  . Lithium level  . Basic metabolic panel  . TSH      -------------------------------

## 2019-03-25 DIAGNOSIS — F3342 Major depressive disorder, recurrent, in full remission: Secondary | ICD-10-CM | POA: Diagnosis not present

## 2019-03-25 DIAGNOSIS — Z79899 Other long term (current) drug therapy: Secondary | ICD-10-CM | POA: Diagnosis not present

## 2019-03-26 LAB — BASIC METABOLIC PANEL
BUN/Creatinine Ratio: 18 (ref 10–24)
BUN: 20 mg/dL (ref 8–27)
CO2: 20 mmol/L (ref 20–29)
Calcium: 9.7 mg/dL (ref 8.6–10.2)
Chloride: 101 mmol/L (ref 96–106)
Creatinine, Ser: 1.14 mg/dL (ref 0.76–1.27)
GFR calc Af Amer: 68 mL/min/{1.73_m2} (ref >59)
GFR calc non Af Amer: 59 mL/min/{1.73_m2} — ABNORMAL LOW (ref >59)
Glucose: 99 mg/dL (ref 65–99)
Potassium: 5.1 mmol/L (ref 3.5–5.2)
Sodium: 138 mmol/L (ref 134–144)

## 2019-03-26 LAB — LITHIUM LEVEL: Lithium Lvl: 0.5 mmol/L — ABNORMAL LOW (ref 0.6–1.2)

## 2019-03-26 LAB — TSH: TSH: 2.04 u[IU]/mL (ref 0.450–4.500)

## 2019-06-03 ENCOUNTER — Other Ambulatory Visit: Payer: Self-pay

## 2019-06-03 DIAGNOSIS — Z20828 Contact with and (suspected) exposure to other viral communicable diseases: Secondary | ICD-10-CM | POA: Diagnosis not present

## 2019-06-03 DIAGNOSIS — Z20822 Contact with and (suspected) exposure to covid-19: Secondary | ICD-10-CM

## 2019-06-05 LAB — NOVEL CORONAVIRUS, NAA: SARS-CoV-2, NAA: NOT DETECTED

## 2019-07-11 ENCOUNTER — Ambulatory Visit: Payer: Medicare Other | Attending: Internal Medicine

## 2019-07-11 DIAGNOSIS — F330 Major depressive disorder, recurrent, mild: Secondary | ICD-10-CM | POA: Diagnosis not present

## 2019-07-11 DIAGNOSIS — N183 Chronic kidney disease, stage 3 unspecified: Secondary | ICD-10-CM | POA: Diagnosis not present

## 2019-07-11 DIAGNOSIS — I129 Hypertensive chronic kidney disease with stage 1 through stage 4 chronic kidney disease, or unspecified chronic kidney disease: Secondary | ICD-10-CM | POA: Diagnosis not present

## 2019-07-11 DIAGNOSIS — Z23 Encounter for immunization: Secondary | ICD-10-CM | POA: Insufficient documentation

## 2019-07-11 DIAGNOSIS — E7800 Pure hypercholesterolemia, unspecified: Secondary | ICD-10-CM | POA: Diagnosis not present

## 2019-07-11 DIAGNOSIS — F325 Major depressive disorder, single episode, in full remission: Secondary | ICD-10-CM | POA: Diagnosis not present

## 2019-07-29 ENCOUNTER — Ambulatory Visit: Payer: Medicare Other | Attending: Internal Medicine

## 2019-07-29 DIAGNOSIS — Z23 Encounter for immunization: Secondary | ICD-10-CM | POA: Insufficient documentation

## 2019-07-29 NOTE — Progress Notes (Signed)
   Covid-19 Vaccination Clinic  Name:  Kirin Bueltel    MRN: [id number] DOB: [birth year]  07/29/2019  Mr. Steidinger was observed post Covid-19 immunization for 15 minutes without incidence. He was provided with Vaccine Information Sheet and instruction to access the V-Safe system.   Mr. Buenafe was instructed to call 911 with any severe reactions post vaccine: Marland Kitchen Difficulty breathing  . Swelling of your face and throat  . A fast heartbeat  . A bad rash all over your body  . Dizziness and weakness    Immunizations Administered    Name Date Dose VIS Date Route   Pfizer COVID-19 Vaccine 07/29/2019 11:10 AM 0.3 mL 06/06/2019 Intramuscular   Manufacturer: [place]   Lot: [id number]   [place]: [id number]

## 2019-08-05 DIAGNOSIS — Z1389 Encounter for screening for other disorder: Secondary | ICD-10-CM | POA: Diagnosis not present

## 2019-08-05 DIAGNOSIS — Z6835 Body mass index (BMI) 35.0-35.9, adult: Secondary | ICD-10-CM | POA: Diagnosis not present

## 2019-08-05 DIAGNOSIS — F325 Major depressive disorder, single episode, in full remission: Secondary | ICD-10-CM | POA: Diagnosis not present

## 2019-08-05 DIAGNOSIS — I129 Hypertensive chronic kidney disease with stage 1 through stage 4 chronic kidney disease, or unspecified chronic kidney disease: Secondary | ICD-10-CM | POA: Diagnosis not present

## 2019-08-05 DIAGNOSIS — N1831 Chronic kidney disease, stage 3a: Secondary | ICD-10-CM | POA: Diagnosis not present

## 2019-08-05 DIAGNOSIS — Z0000 Encounter for general adult medical examination without abnormal findings: Secondary | ICD-10-CM | POA: Diagnosis not present

## 2019-08-05 DIAGNOSIS — Z79899 Other long term (current) drug therapy: Secondary | ICD-10-CM | POA: Diagnosis not present

## 2019-08-05 DIAGNOSIS — R739 Hyperglycemia, unspecified: Secondary | ICD-10-CM | POA: Diagnosis not present

## 2019-08-08 DIAGNOSIS — F325 Major depressive disorder, single episode, in full remission: Secondary | ICD-10-CM | POA: Diagnosis not present

## 2019-08-08 DIAGNOSIS — I129 Hypertensive chronic kidney disease with stage 1 through stage 4 chronic kidney disease, or unspecified chronic kidney disease: Secondary | ICD-10-CM | POA: Diagnosis not present

## 2019-08-08 DIAGNOSIS — E7800 Pure hypercholesterolemia, unspecified: Secondary | ICD-10-CM | POA: Diagnosis not present

## 2019-09-08 DIAGNOSIS — N529 Male erectile dysfunction, unspecified: Secondary | ICD-10-CM | POA: Diagnosis not present

## 2019-09-08 DIAGNOSIS — R972 Elevated prostate specific antigen [PSA]: Secondary | ICD-10-CM | POA: Diagnosis not present

## 2019-09-08 DIAGNOSIS — N401 Enlarged prostate with lower urinary tract symptoms: Secondary | ICD-10-CM | POA: Diagnosis not present

## 2019-09-08 DIAGNOSIS — N200 Calculus of kidney: Secondary | ICD-10-CM | POA: Diagnosis not present

## 2019-09-17 DIAGNOSIS — F325 Major depressive disorder, single episode, in full remission: Secondary | ICD-10-CM | POA: Diagnosis not present

## 2019-09-17 DIAGNOSIS — E7800 Pure hypercholesterolemia, unspecified: Secondary | ICD-10-CM | POA: Diagnosis not present

## 2019-09-17 DIAGNOSIS — I129 Hypertensive chronic kidney disease with stage 1 through stage 4 chronic kidney disease, or unspecified chronic kidney disease: Secondary | ICD-10-CM | POA: Diagnosis not present

## 2019-10-03 ENCOUNTER — Other Ambulatory Visit: Payer: Self-pay | Admitting: Geriatric Medicine

## 2019-10-03 ENCOUNTER — Ambulatory Visit
Admission: RE | Admit: 2019-10-03 | Discharge: 2019-10-03 | Disposition: A | Discharge status: Home / Self Care | Payer: Medicare Other | Source: Ambulatory Visit | Attending: Geriatric Medicine | Admitting: Geriatric Medicine

## 2019-10-03 DIAGNOSIS — R05 Cough: Secondary | ICD-10-CM | POA: Diagnosis not present

## 2019-10-03 DIAGNOSIS — I129 Hypertensive chronic kidney disease with stage 1 through stage 4 chronic kidney disease, or unspecified chronic kidney disease: Secondary | ICD-10-CM | POA: Diagnosis not present

## 2019-10-03 DIAGNOSIS — E1121 Type 2 diabetes mellitus with diabetic nephropathy: Secondary | ICD-10-CM | POA: Diagnosis not present

## 2019-10-03 DIAGNOSIS — R059 Cough, unspecified: Secondary | ICD-10-CM

## 2019-10-03 DIAGNOSIS — N1831 Chronic kidney disease, stage 3a: Secondary | ICD-10-CM | POA: Diagnosis not present

## 2019-10-08 DIAGNOSIS — F325 Major depressive disorder, single episode, in full remission: Secondary | ICD-10-CM | POA: Diagnosis not present

## 2019-10-08 DIAGNOSIS — E7800 Pure hypercholesterolemia, unspecified: Secondary | ICD-10-CM | POA: Diagnosis not present

## 2019-10-08 DIAGNOSIS — I129 Hypertensive chronic kidney disease with stage 1 through stage 4 chronic kidney disease, or unspecified chronic kidney disease: Secondary | ICD-10-CM | POA: Diagnosis not present

## 2019-10-08 DIAGNOSIS — E1121 Type 2 diabetes mellitus with diabetic nephropathy: Secondary | ICD-10-CM | POA: Diagnosis not present

## 2019-11-17 DIAGNOSIS — I129 Hypertensive chronic kidney disease with stage 1 through stage 4 chronic kidney disease, or unspecified chronic kidney disease: Secondary | ICD-10-CM | POA: Diagnosis not present

## 2019-11-17 DIAGNOSIS — R600 Localized edema: Secondary | ICD-10-CM | POA: Diagnosis not present

## 2019-11-17 DIAGNOSIS — N1831 Chronic kidney disease, stage 3a: Secondary | ICD-10-CM | POA: Diagnosis not present

## 2020-01-27 DIAGNOSIS — L403 Pustulosis palmaris et plantaris: Secondary | ICD-10-CM | POA: Diagnosis not present

## 2020-03-18 ENCOUNTER — Ambulatory Visit: Payer: Medicare Other | Admitting: Psychiatry

## 2020-03-21 ENCOUNTER — Other Ambulatory Visit: Payer: Self-pay | Admitting: Psychiatry

## 2020-03-21 DIAGNOSIS — F3342 Major depressive disorder, recurrent, in full remission: Secondary | ICD-10-CM

## 2020-03-23 NOTE — Telephone Encounter (Signed)
review 

## 2020-03-26 DIAGNOSIS — H5213 Myopia, bilateral: Secondary | ICD-10-CM | POA: Diagnosis not present

## 2020-03-26 DIAGNOSIS — H52203 Unspecified astigmatism, bilateral: Secondary | ICD-10-CM | POA: Diagnosis not present

## 2020-03-26 DIAGNOSIS — H524 Presbyopia: Secondary | ICD-10-CM | POA: Diagnosis not present

## 2020-03-26 DIAGNOSIS — H2513 Age-related nuclear cataract, bilateral: Secondary | ICD-10-CM | POA: Diagnosis not present

## 2020-03-26 DIAGNOSIS — H35373 Puckering of macula, bilateral: Secondary | ICD-10-CM | POA: Diagnosis not present

## 2020-03-30 ENCOUNTER — Ambulatory Visit: Payer: Medicare Other | Attending: Internal Medicine

## 2020-03-30 DIAGNOSIS — Z23 Encounter for immunization: Secondary | ICD-10-CM

## 2020-03-30 NOTE — Progress Notes (Signed)
° °  Covid-19 Vaccination Clinic  Name:  Chase Mathis    MRN: [id number] DOB: [birth year]  03/30/2020  Mr. Chase Mathis was observed post Covid-19 immunization for 15 minutes without incident. He was provided with Vaccine Information Sheet and instruction to access the V-Safe system.   Mr. Chase Mathis was instructed to call 911 with any severe reactions post vaccine:  Difficulty breathing   Swelling of face and throat   A fast heartbeat   A bad rash all over body   Dizziness and weakness

## 2020-04-01 DIAGNOSIS — I129 Hypertensive chronic kidney disease with stage 1 through stage 4 chronic kidney disease, or unspecified chronic kidney disease: Secondary | ICD-10-CM | POA: Diagnosis not present

## 2020-04-01 DIAGNOSIS — Z23 Encounter for immunization: Secondary | ICD-10-CM | POA: Diagnosis not present

## 2020-04-01 DIAGNOSIS — N1831 Chronic kidney disease, stage 3a: Secondary | ICD-10-CM | POA: Diagnosis not present

## 2020-04-21 ENCOUNTER — Other Ambulatory Visit: Payer: Self-pay | Admitting: Psychiatry

## 2020-04-21 DIAGNOSIS — F3342 Major depressive disorder, recurrent, in full remission: Secondary | ICD-10-CM

## 2020-04-24 DIAGNOSIS — E7800 Pure hypercholesterolemia, unspecified: Secondary | ICD-10-CM | POA: Diagnosis not present

## 2020-04-24 DIAGNOSIS — N1831 Chronic kidney disease, stage 3a: Secondary | ICD-10-CM | POA: Diagnosis not present

## 2020-04-24 DIAGNOSIS — F325 Major depressive disorder, single episode, in full remission: Secondary | ICD-10-CM | POA: Diagnosis not present

## 2020-04-24 DIAGNOSIS — E1121 Type 2 diabetes mellitus with diabetic nephropathy: Secondary | ICD-10-CM | POA: Diagnosis not present

## 2020-04-24 DIAGNOSIS — I129 Hypertensive chronic kidney disease with stage 1 through stage 4 chronic kidney disease, or unspecified chronic kidney disease: Secondary | ICD-10-CM | POA: Diagnosis not present

## 2020-05-11 DIAGNOSIS — Z79899 Other long term (current) drug therapy: Secondary | ICD-10-CM | POA: Diagnosis not present

## 2020-05-18 DIAGNOSIS — F325 Major depressive disorder, single episode, in full remission: Secondary | ICD-10-CM | POA: Diagnosis not present

## 2020-05-18 DIAGNOSIS — E7800 Pure hypercholesterolemia, unspecified: Secondary | ICD-10-CM | POA: Diagnosis not present

## 2020-05-18 DIAGNOSIS — E1121 Type 2 diabetes mellitus with diabetic nephropathy: Secondary | ICD-10-CM | POA: Diagnosis not present

## 2020-05-18 DIAGNOSIS — I129 Hypertensive chronic kidney disease with stage 1 through stage 4 chronic kidney disease, or unspecified chronic kidney disease: Secondary | ICD-10-CM | POA: Diagnosis not present

## 2020-05-18 DIAGNOSIS — N1831 Chronic kidney disease, stage 3a: Secondary | ICD-10-CM | POA: Diagnosis not present

## 2020-05-27 DIAGNOSIS — B351 Tinea unguium: Secondary | ICD-10-CM | POA: Diagnosis not present

## 2020-05-27 DIAGNOSIS — L299 Pruritus, unspecified: Secondary | ICD-10-CM | POA: Diagnosis not present

## 2020-05-27 DIAGNOSIS — L853 Xerosis cutis: Secondary | ICD-10-CM | POA: Diagnosis not present

## 2020-05-27 DIAGNOSIS — L301 Dyshidrosis [pompholyx]: Secondary | ICD-10-CM | POA: Diagnosis not present

## 2020-06-27 ENCOUNTER — Other Ambulatory Visit: Payer: Self-pay | Admitting: Psychiatry

## 2020-06-27 DIAGNOSIS — F3342 Major depressive disorder, recurrent, in full remission: Secondary | ICD-10-CM

## 2020-06-28 NOTE — Telephone Encounter (Signed)
Call to RS

## 2020-06-30 NOTE — Telephone Encounter (Signed)
LM to call for appointment

## 2020-07-08 DIAGNOSIS — N1831 Chronic kidney disease, stage 3a: Secondary | ICD-10-CM | POA: Diagnosis not present

## 2020-07-08 DIAGNOSIS — F325 Major depressive disorder, single episode, in full remission: Secondary | ICD-10-CM | POA: Diagnosis not present

## 2020-07-08 DIAGNOSIS — E1121 Type 2 diabetes mellitus with diabetic nephropathy: Secondary | ICD-10-CM | POA: Diagnosis not present

## 2020-07-08 DIAGNOSIS — I129 Hypertensive chronic kidney disease with stage 1 through stage 4 chronic kidney disease, or unspecified chronic kidney disease: Secondary | ICD-10-CM | POA: Diagnosis not present

## 2020-07-08 DIAGNOSIS — E7800 Pure hypercholesterolemia, unspecified: Secondary | ICD-10-CM | POA: Diagnosis not present

## 2020-07-26 ENCOUNTER — Other Ambulatory Visit: Payer: Self-pay | Admitting: Psychiatry

## 2020-07-26 DIAGNOSIS — F3342 Major depressive disorder, recurrent, in full remission: Secondary | ICD-10-CM

## 2020-08-05 DIAGNOSIS — Z1389 Encounter for screening for other disorder: Secondary | ICD-10-CM | POA: Diagnosis not present

## 2020-08-05 DIAGNOSIS — E1121 Type 2 diabetes mellitus with diabetic nephropathy: Secondary | ICD-10-CM | POA: Diagnosis not present

## 2020-08-05 DIAGNOSIS — I129 Hypertensive chronic kidney disease with stage 1 through stage 4 chronic kidney disease, or unspecified chronic kidney disease: Secondary | ICD-10-CM | POA: Diagnosis not present

## 2020-08-05 DIAGNOSIS — Z0000 Encounter for general adult medical examination without abnormal findings: Secondary | ICD-10-CM | POA: Diagnosis not present

## 2020-08-05 DIAGNOSIS — Z79899 Other long term (current) drug therapy: Secondary | ICD-10-CM | POA: Diagnosis not present

## 2020-08-05 DIAGNOSIS — G629 Polyneuropathy, unspecified: Secondary | ICD-10-CM | POA: Diagnosis not present

## 2020-08-05 DIAGNOSIS — Z6835 Body mass index (BMI) 35.0-35.9, adult: Secondary | ICD-10-CM | POA: Diagnosis not present

## 2020-08-05 DIAGNOSIS — N1831 Chronic kidney disease, stage 3a: Secondary | ICD-10-CM | POA: Diagnosis not present

## 2020-08-05 DIAGNOSIS — F321 Major depressive disorder, single episode, moderate: Secondary | ICD-10-CM | POA: Diagnosis not present

## 2020-08-05 DIAGNOSIS — E7800 Pure hypercholesterolemia, unspecified: Secondary | ICD-10-CM | POA: Diagnosis not present

## 2020-08-30 DIAGNOSIS — H35373 Puckering of macula, bilateral: Secondary | ICD-10-CM | POA: Diagnosis not present

## 2020-08-30 DIAGNOSIS — H2513 Age-related nuclear cataract, bilateral: Secondary | ICD-10-CM | POA: Diagnosis not present

## 2020-09-16 DIAGNOSIS — N200 Calculus of kidney: Secondary | ICD-10-CM | POA: Diagnosis not present

## 2020-09-16 DIAGNOSIS — R972 Elevated prostate specific antigen [PSA]: Secondary | ICD-10-CM | POA: Diagnosis not present

## 2020-09-16 DIAGNOSIS — N401 Enlarged prostate with lower urinary tract symptoms: Secondary | ICD-10-CM | POA: Diagnosis not present

## 2020-09-17 DIAGNOSIS — L299 Pruritus, unspecified: Secondary | ICD-10-CM | POA: Diagnosis not present

## 2020-09-17 DIAGNOSIS — L209 Atopic dermatitis, unspecified: Secondary | ICD-10-CM | POA: Diagnosis not present

## 2020-09-28 DIAGNOSIS — Z8709 Personal history of other diseases of the respiratory system: Secondary | ICD-10-CM | POA: Diagnosis not present

## 2020-09-28 DIAGNOSIS — R040 Epistaxis: Secondary | ICD-10-CM | POA: Diagnosis not present

## 2020-10-02 DIAGNOSIS — I129 Hypertensive chronic kidney disease with stage 1 through stage 4 chronic kidney disease, or unspecified chronic kidney disease: Secondary | ICD-10-CM | POA: Diagnosis not present

## 2020-10-02 DIAGNOSIS — E1121 Type 2 diabetes mellitus with diabetic nephropathy: Secondary | ICD-10-CM | POA: Diagnosis not present

## 2020-10-02 DIAGNOSIS — F321 Major depressive disorder, single episode, moderate: Secondary | ICD-10-CM | POA: Diagnosis not present

## 2020-10-02 DIAGNOSIS — F325 Major depressive disorder, single episode, in full remission: Secondary | ICD-10-CM | POA: Diagnosis not present

## 2020-10-02 DIAGNOSIS — E7800 Pure hypercholesterolemia, unspecified: Secondary | ICD-10-CM | POA: Diagnosis not present

## 2020-10-02 DIAGNOSIS — N1831 Chronic kidney disease, stage 3a: Secondary | ICD-10-CM | POA: Diagnosis not present

## 2020-10-06 DIAGNOSIS — Z23 Encounter for immunization: Secondary | ICD-10-CM | POA: Diagnosis not present

## 2020-10-11 DIAGNOSIS — M25751 Osteophyte, right hip: Secondary | ICD-10-CM | POA: Diagnosis not present

## 2020-10-11 DIAGNOSIS — M25752 Osteophyte, left hip: Secondary | ICD-10-CM | POA: Diagnosis not present

## 2020-10-11 DIAGNOSIS — M19072 Primary osteoarthritis, left ankle and foot: Secondary | ICD-10-CM | POA: Diagnosis not present

## 2020-10-11 DIAGNOSIS — M25852 Other specified joint disorders, left hip: Secondary | ICD-10-CM | POA: Diagnosis not present

## 2020-10-11 DIAGNOSIS — M25851 Other specified joint disorders, right hip: Secondary | ICD-10-CM | POA: Diagnosis not present

## 2020-10-11 DIAGNOSIS — M160 Bilateral primary osteoarthritis of hip: Secondary | ICD-10-CM | POA: Diagnosis not present

## 2020-11-10 DIAGNOSIS — H35373 Puckering of macula, bilateral: Secondary | ICD-10-CM | POA: Diagnosis not present

## 2020-11-10 DIAGNOSIS — H25813 Combined forms of age-related cataract, bilateral: Secondary | ICD-10-CM | POA: Diagnosis not present

## 2020-11-30 ENCOUNTER — Other Ambulatory Visit: Payer: Self-pay | Admitting: Psychiatry

## 2020-11-30 DIAGNOSIS — F3342 Major depressive disorder, recurrent, in full remission: Secondary | ICD-10-CM

## 2020-11-30 DIAGNOSIS — Z79899 Other long term (current) drug therapy: Secondary | ICD-10-CM

## 2020-11-30 NOTE — Telephone Encounter (Signed)
No apt since 02/2019

## 2020-11-30 NOTE — Telephone Encounter (Signed)
LVM with info and to call back with any questions

## 2020-11-30 NOTE — Telephone Encounter (Signed)
I sent message to staff to have him schedule an appt.  Please ask him to go to [facility] and get lithium and nortriptyline blood levels.  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

## 2020-11-30 NOTE — Telephone Encounter (Signed)
Call to RS

## 2020-12-06 DIAGNOSIS — I129 Hypertensive chronic kidney disease with stage 1 through stage 4 chronic kidney disease, or unspecified chronic kidney disease: Secondary | ICD-10-CM | POA: Diagnosis not present

## 2020-12-06 DIAGNOSIS — F325 Major depressive disorder, single episode, in full remission: Secondary | ICD-10-CM | POA: Diagnosis not present

## 2020-12-06 DIAGNOSIS — E7800 Pure hypercholesterolemia, unspecified: Secondary | ICD-10-CM | POA: Diagnosis not present

## 2020-12-06 DIAGNOSIS — N1831 Chronic kidney disease, stage 3a: Secondary | ICD-10-CM | POA: Diagnosis not present

## 2020-12-06 DIAGNOSIS — R269 Unspecified abnormalities of gait and mobility: Secondary | ICD-10-CM | POA: Diagnosis not present

## 2020-12-13 DIAGNOSIS — Z79899 Other long term (current) drug therapy: Secondary | ICD-10-CM | POA: Diagnosis not present

## 2020-12-13 DIAGNOSIS — F3342 Major depressive disorder, recurrent, in full remission: Secondary | ICD-10-CM | POA: Diagnosis not present

## 2020-12-15 ENCOUNTER — Telehealth: Payer: Self-pay | Admitting: Psychiatry

## 2020-12-16 DIAGNOSIS — M109 Gout, unspecified: Secondary | ICD-10-CM | POA: Diagnosis not present

## 2020-12-16 DIAGNOSIS — M19072 Primary osteoarthritis, left ankle and foot: Secondary | ICD-10-CM | POA: Diagnosis not present

## 2020-12-16 DIAGNOSIS — M67372 Transient synovitis, left ankle and foot: Secondary | ICD-10-CM | POA: Diagnosis not present

## 2020-12-17 NOTE — Progress Notes (Signed)
His nortriptyline level is 303 and should be 150 or lower.  He is on 2 of the 50 mg capsules.  Tell him to cut the dose back to one of the 50 mg capsules.  He has an appointment in August.  Let them know if he has any problem with the dosage reduction and to let us know.

## 2020-12-17 NOTE — Progress Notes (Signed)
Left detailed message to pt and will call again on monday

## 2020-12-20 DIAGNOSIS — M109 Gout, unspecified: Secondary | ICD-10-CM | POA: Diagnosis not present

## 2020-12-21 NOTE — Progress Notes (Signed)
Spoke to pt and he said he did get my message.He is taking 50 mg daily.

## 2020-12-22 NOTE — Telephone Encounter (Signed)
ERROR

## 2020-12-24 NOTE — Progress Notes (Signed)
Assessment/Plan:   1.  Gait instabity, likely due to peripheral neuropathy  -He has had B12 deficiency in the past and we will recheck that.             -Patient is also on nortriptyline, which is an anticholinergic and certainly can contribute to gait instability, although he has not had a large dose.  Nortriptyline levels were checked recently and were elevated and his dosage was decreased.  This was just done on June 20.    2.  B12 deficiency             -Patient stopped supplement.  Will recheck to see if still low.  Will also check  homocystine and methylmalonic acid.   3.  Tremor             -This is likely due to the lithium.  Studies are varied, but incidate that up to 2/3 of patients on lithium will have lithium-induced tremor, even if the patients are not toxic on the medication.  This is just a common side effect of patients on lithium.  It doesn't particularly bother him and is very mild.  The benefits of the medication outweigh the SE of tremor.  -we will do a DaT scan to r/o coexisting parkinsonism.    4.  Memory change  -will schedule neurocog testing.  No driving until then and may still need driving eval (had one at [facility] in the past) Subjective:   Chase Mathis was seen today in neurologic follow-up at the request of Stoneking, Hal, MD. I have not seen the patient in about 2 years and that visit was a phone visit.  Wife with patient and supplements hx.  Previously, I saw the patient for gait instability, felt due to peripheral neuropathy.  He was doing fairly well at that point in time.  He also had a lithium induced tremor.  States that he is intermittently shuffling and balance is getting worse.  Wife states that she used to think he could control the shuffle and he was "just lazy" but now she doesn't think that is the case.  Has had 2-3 falls in the last few weeks - one at church coming out of the stairs - he states that this was a depth perception issue.  He was getting  the mail with one of them and had to crawl to the nearest tree to get up.  With the other, he fell on his wife on a path in the yard (knocked his wife over in the fall).  Tremor is "not worse" per patient - wife states that if he is agitated or can't get something done, it gets worse.  Voice: no change Sleep: sleeps okay per pt; wife says this isn't the case b/c of nocturia  Vivid Dreams:  No. Wet Pillows: No. Per pt; yes per wife Bradykinesia symptoms: difficulty getting out of a chair (but he thinks that he is better now because not using lounge chair anymore) Loss of smell:  No. Loss of taste:  No. Urinary Incontinence:  occasional "accident"; wife thinks that he will need depends Difficulty Swallowing:  No. Trouble with ADL's:  No., except has some trouble putting on pants while standing up  Trouble buttoning clothing: No. Depression:  No. (Hx of depression but controlled now after ECT- wife states that he had more depression recently and she found he wasn't taking his meds) Memory changes:  No. Per pt but wife states it is "noticeably worse."  States that it is worse since fall 2 weeks ago.  Family wouldn't let him drive over the last few weeks. N/V:  No. Lightheaded:  No.  Syncope: No. Diplopia:  Yes.  , not if driving local but at dusk if lots of traffic, he will note double.  None with watching TV     ALLERGIES:  No Known Allergies  CURRENT MEDICATIONS:  Outpatient Encounter Medications as of 12/28/2020  Medication Sig   lisinopril (PRINIVIL,ZESTRIL) 10 MG tablet Take 20 mg by mouth daily.    lithium carbonate 150 MG capsule TAKE 3 CAPSULES BY MOUTH AT BEDTIME   nortriptyline (PAMELOR) 50 MG capsule TAKE 2 CAPSULES BY MOUTH AT BEDTIME   amLODipine (NORVASC) 5 MG tablet Take 5 mg by mouth daily. (Patient not taking: Reported on 12/28/2020)   finasteride (PROSCAR) 5 MG tablet Take 1 tablet (5 mg total) by mouth daily. (Patient not taking: Reported on 12/28/2020)   No  facility-administered encounter medications on file as of 12/28/2020.    Objective:   PHYSICAL EXAMINATION:    VITALS:   Vitals:   12/28/20 0829  BP: 138/84  Pulse: 75  SpO2: 97%  Weight: 225 lb (102.1 kg)  Height: 5\' 8"  (1.727 m)    GEN:  Normal appears male in no acute distress.  Appears stated age. HEENT:  Normocephalic, atraumatic. The mucous membranes are moist. The superficial temporal arteries are without ropiness or tenderness. Cardiovascular: Regular rate and rhythm. Lungs: Clear to auscultation bilaterally. Neck/Heme: There are no carotid bruits noted bilaterally.  NEUROLOGICAL: Orientation: The patient is alert and oriented x3. looks to wife for finer aspect of the hx. Cranial nerves: There is good facial symmetry.  Extraocular muscles are intact. The visual fields are full to confrontational testing. The speech is fluent and clear. Soft palate rises symmetrically and there is no tongue deviation. Hearing is intact to conversational tone. Sensation: Sensation is intact to light and pinprick throughout (facial, trunk, extremities). Vibration is decreased at the bilateral big toe and ankle. There is no extinction with double simultaneous stimulation. There is no sensory dermatomal level identified. Motor: Strength is 5/5 in the bilateral upper and lower extremities.   Shoulder shrug is equal and symmetric.  There is no pronator drift. Deep tendon reflexes: Deep tendon reflexes are 0-1/4 at the bilateral biceps, triceps, brachioradialis, 1/4 at the bilateral patella and absent at the bilateral achilles. Plantar responses are downgoing bilaterally.   Movement examination: Tone: There is normal tone in the bilateral upper extremities.  The tone in the lower extremities is normal.  He does, however, have significant difficulty relaxing Abnormal movements: There is no rest tremor than is seen.  Rest tremor is felt with distraction.  He does, however, having intention and postural  tremor.  It is very mild. Coordination:  There is no decremation with RAM's, with any form of RAMS, including alternating supination and pronation of the forearm, hand opening and closing, finger taps, heel taps and toe taps. Gait and Station: The patient has no difficulty arising out of a deep-seated chair without the use of the hands. The patient drags the heels but stride length is okay (is short in the doorway).  Decreased arm swing on the L only.    Chemistry      Component Value Date/Time   NA 138 03/25/2019 1101   K 5.1 03/25/2019 1101   CL 101 03/25/2019 1101   CO2 20 03/25/2019 1101   BUN 20 03/25/2019 1101   CREATININE  1.14 03/25/2019 1101   CREATININE 1.18 (H) 04/18/2018 0937      Component Value Date/Time   CALCIUM 9.7 03/25/2019 1101   ALKPHOS 54 12/19/2016 2211   AST 29 12/19/2016 2211   ALT 42 12/19/2016 2211   BILITOT 0.7 12/19/2016 2211     No results found for: VITAMINB12    Total time spent on today's visit was 50 minutes, including both face-to-face time and nonface-to-face time.  Time included that spent on review of records (prior notes available to me/labs/imaging if pertinent), discussing treatment and goals, answering patient's questions and coordinating care.   Cc:  Lajean Manes, MD

## 2020-12-28 ENCOUNTER — Encounter: Payer: Self-pay | Admitting: Neurology

## 2020-12-28 ENCOUNTER — Other Ambulatory Visit: Payer: Self-pay

## 2020-12-28 ENCOUNTER — Other Ambulatory Visit (INDEPENDENT_AMBULATORY_CARE_PROVIDER_SITE_OTHER): Payer: Medicare Other

## 2020-12-28 ENCOUNTER — Ambulatory Visit (INDEPENDENT_AMBULATORY_CARE_PROVIDER_SITE_OTHER): Payer: Medicare Other | Admitting: Neurology

## 2020-12-28 VITALS — BP 138/84 | HR 75 | Ht 68.0 in | Wt 225.0 lb

## 2020-12-28 DIAGNOSIS — R251 Tremor, unspecified: Secondary | ICD-10-CM

## 2020-12-28 DIAGNOSIS — R413 Other amnesia: Secondary | ICD-10-CM

## 2020-12-28 LAB — VITAMIN B12: Vitamin B-12: 168 pg/mL — ABNORMAL LOW (ref 211–911)

## 2020-12-28 NOTE — Patient Instructions (Signed)
Your provider has requested that you have labwork completed today. The lab is located on the Second floor at [facility], within the [facility] office. When you get off the elevator, turn right and go in the [facility] [address]; the first brown door on the left.  Tell the ladies behind the desk that you are there for lab work. If you are not called within 15 minutes please check with the front desk.   Once you complete your labs you are free to go. You will receive a call or message via MyChart with your lab results.    You have been referred for a neurocognitive evaluation (i.e., evaluation of memory and thinking abilities). Please bring someone with you to this appointment if possible, as it is helpful for the neuropsychologist to hear from both you and another adult who knows you well. Please bring eyeglasses and hearing aids if you wear them.    The evaluation will take approximately 3 hours and has two parts:   The first part is a clinical interview with the neuropsychologist, Dr. Melvyn Novas or Dr. Nicole Kindred. During the interview, the neuropsychologist will speak with you and the individual you brought to the appointment.    The second part of the evaluation is testing with the doctor's technician, aka psychometrician, Hinton Dyer or [employer]. During the testing, the technician will ask you to remember different types of material, solve problems, and answer some questionnaires. Your family member will not be present for this portion of the evaluation.   Please note: We have to reserve several hours of the neuropsychologist's time and the psychometrician's time for your evaluation appointment. As such, there is a No-Show fee of $100. If you are unable to attend any of your appointments, please contact our office as soon as possible to reschedule.

## 2020-12-29 DIAGNOSIS — Z20822 Contact with and (suspected) exposure to covid-19: Secondary | ICD-10-CM | POA: Diagnosis not present

## 2020-12-29 LAB — HOMOCYSTEINE: Homocysteine: 19 umol/L — ABNORMAL HIGH (ref <11.4)

## 2020-12-30 LAB — METHYLMALONIC ACID, SERUM: Methylmalonic Acid, Quant: 211 nmol/L (ref 87–318)

## 2021-01-03 NOTE — Progress Notes (Signed)
No answer at 354 01/03/2021

## 2021-01-04 ENCOUNTER — Encounter: Payer: Self-pay | Admitting: Psychology

## 2021-01-04 ENCOUNTER — Ambulatory Visit: Payer: Medicare Other | Admitting: Psychology

## 2021-01-04 ENCOUNTER — Other Ambulatory Visit: Payer: Self-pay

## 2021-01-04 ENCOUNTER — Ambulatory Visit (INDEPENDENT_AMBULATORY_CARE_PROVIDER_SITE_OTHER): Payer: Medicare Other | Admitting: Psychology

## 2021-01-04 DIAGNOSIS — G629 Polyneuropathy, unspecified: Secondary | ICD-10-CM | POA: Insufficient documentation

## 2021-01-04 DIAGNOSIS — G251 Drug-induced tremor: Secondary | ICD-10-CM | POA: Insufficient documentation

## 2021-01-04 DIAGNOSIS — Z9889 Other specified postprocedural states: Secondary | ICD-10-CM | POA: Insufficient documentation

## 2021-01-04 DIAGNOSIS — G3184 Mild cognitive impairment, so stated: Secondary | ICD-10-CM

## 2021-01-04 DIAGNOSIS — I10 Essential (primary) hypertension: Secondary | ICD-10-CM | POA: Insufficient documentation

## 2021-01-04 DIAGNOSIS — R4189 Other symptoms and signs involving cognitive functions and awareness: Secondary | ICD-10-CM

## 2021-01-04 NOTE — Progress Notes (Signed)
   Psychometrician Note   Cognitive testing was administered to Chase Mathis by Milana Kidney, B.S. (psychometrist) under the supervision of Dr. Christia Reading, Ph.D., licensed psychologist on 01/04/21. Chase Mathis did not appear overtly distressed by the testing session per behavioral observation or responses across self-report questionnaires. Rest breaks were offered.    The battery of tests administered was selected by Dr. Christia Reading, Ph.D. with consideration to Chase Mathis current level of functioning, the nature of his symptoms, emotional and behavioral responses during interview, level of literacy, observed level of motivation/effort, and the nature of the referral question. This battery was communicated to the psychometrist. Communication between Dr. Christia Reading, Ph.D. and the psychometrist was ongoing throughout the evaluation and Dr. Christia Reading, Ph.D. was immediately accessible at all times. Dr. Christia Reading, Ph.D. provided supervision to the psychometrist on the date of this service to the extent necessary to assure the quality of all services provided.    Chase Mathis within approximately 1-2 weeks for an interactive feedback session with Dr. Melvyn Novas at which time his test performances, clinical impressions, and treatment recommendations will be reviewed in detail. Chase Mathis understands he can contact our office should he require our assistance before this time.  A total of 130 minutes of billable time were spent face-to-face with Chase Mathis by the psychometrist. This includes both test administration and scoring time. Billing for these services is reflected in the clinical report generated by Dr. Christia Reading, Ph.D.  This note reflects time spent with the psychometrician and does not include test scores or any clinical interpretations made by Dr. Melvyn Novas. The full report will follow in a separate note.

## 2021-01-04 NOTE — Progress Notes (Signed)
NEUROPSYCHOLOGICAL EVALUATION Chase Mathis. [facility] Department of Neurology  Date of Evaluation: January 04, 2021  Reason for Referral:   Chase Mathis is a 87 y.o. right-handed Caucasian male referred by Chase Mathis, D.O., to characterize his current cognitive functioning and assist with diagnostic clarity and treatment planning in the context of tremor, gait abnormalities, and subjective cognitive decline.   Assessment and Plan:   Clinical Impression(s): Chase Mathis pattern of performance is suggestive of a primary impairment surrounding both encoding (i.e., learning) and retrieval aspects of memory. Performance across yes/no recognition trials on memory tests were variable, ranging from the exceptionally low to average normative ranges. Outside of this, performances were generally below average to average. While this would be appropriate relative to age-matched peers, I do believe that he has exhibited some fairly diffuse and overall mild decline given his vocational history and level of educational attainment. Mild decline was most apparent across complex attention/concentration, executive functioning, and expressive language. Processing speed, basic attention, receptive language, and visuospatial abilities were consistently strong. Chase Mathis largely denied difficulties completing most instrumental activities of daily living (ADLs) independently. As such, given evidence for cognitive dysfunction described above, he meets criteria for a Mild Neurocognitive Disorder ("mild cognitive impairment") at the present time.  The etiology of cognitive dysfunction is unclear as Chase Mathis produced a fairly nonspecific pattern of impairment and concern for mild decline. It is worth highlighting that trouble with forgetfulness and distractibility can be seen in individuals currently taking Chase Mathis. However, outside of instances where documented Chase Mathis toxicity has occurred, these cognitive  changes are generally mild. Continued consideration surrounding Parkinson's disease seems reasonable as he exhibits shuffling gait, balance concerns, and some tremors. However, it certainly remains possible that these symptoms are medication induced (i.e., Chase Mathis) and the results of his currently ordered Chase Mathis will be enlightening. His overall pattern of performance across testing is not wholly inconsistent with Parkinson's disease; however, processing speed was stronger than what may be expected. Outside of Chase Mathis sleep behaviors, Chase Mathis did not demonstrate behavioral symptoms or profound visuospatial and executive function impairment concerning for Lewy body dementia. I also do not see strong evidence for progressive supranuclear palsy, corticobasal degeneration, or other more rare conditions along the parkinsonian spectrum.   I cannot rule out the early stages of what would be late-onset Alzheimer's disease given that impairment was exhibited across encoding and retrieval aspects of memory. Consolidation was variable, suggesting an inconsistent deficit with memory storage, and performances across other visuospatial and language tasks did not demonstrate age-related impairments. In all likelihood, additional time needs to pass before additional confidence around the potential of this diagnosis can be obtained. Prior neuroimaging did not suggest a strong vascular component and I do not have concerns surrounding frontotemporal dementia at the present time. Continued medical monitoring will be important moving forward.   Recommendations: I agree with Dr. Carles Mathis surrounding the benefits of a Chase Mathis and recommend that Chase Mathis schedule his previously ordered scan by Dr. Carles Mathis as soon as possible. This will provide useful information in determining risks for certain neurological presentations.   A repeat neuropsychological evaluation in 12-18 months (or sooner if functional decline is noted) is recommended to  assess the trajectory of future cognitive decline should it occur. This will also aid in future efforts towards improved diagnostic clarity.  It will be important for Chase Mathis to have another person with him when in situations where he may need to process information, weigh the pros  and cons of different options, and make decisions, in order to ensure that he fully understands and recalls all information to be considered. All important information to remember should be provided in written format in all instances.   Should there be a progression of his current deficits over time, Chase Mathis is unlikely to regain any independent living skills lost. Therefore, it is recommended that he remain as involved as possible in all aspects of household chores, finances, and medication management, with supervision to ensure adequate performance. He will likely benefit from the establishment and maintenance of a routine in order to maximize his functional abilities over time.  Performance across neurocognitive testing is not a strong predictor of an individual's safety operating a motor vehicle. Despite performing well on a driving evaluation in 2020, his wife has taken over driving due to concerns she holds surrounding reaction time. It should be noted that processing speed represented a strength across the current evaluation. However, there were occasional concerns surrounding attention to detail (NAB Driving Scenes). Should he and/or his wife want direct and current recommendations surrounding driving, I would advise that Chase Mathis complete a repeat driving evaluation. They would be encouraged to contact [employer] in [place], [place] at [phone]. Another option would be through [facility]; however, the latter would likely require a referral from a medical doctor. [facility] can be reached directly at [phone].   Chase Mathis is encouraged to attend to lifestyle factors for brain health  (e.g., regular physical exercise, good nutrition habits, regular participation in cognitively-stimulating activities, and general stress management techniques), which are likely to have benefits for both emotional adjustment and cognition. Optimal control of vascular risk factors (including safe cardiovascular exercise and adherence to dietary recommendations) is encouraged.  To address problems with fluctuating attention and executive functioning, he may wish to consider:   -Avoiding external distractions when needing to concentrate   -Limiting exposure to fast paced environments with multiple sensory demands   -Writing down complicated information and using checklists   -Attempting and completing one task at a time (i.e., no multi-tasking)   -Verbalizing aloud each step of a task to maintain focus   -Reducing the amount of information considered at one time  Review of Records:   Chase Mathis was seen by [facility] Wells Guiles Tat, D.O.) on 05/27/2018 for an evaluation of shuffling gait which started around October 2019. Tremor was noted at that time. This was believed to be caused by Chase Mathis as symptoms did increase with increased dosing. Symptoms were said to not be overly troublesome and mood benefits of this medication were substantial. Sleep was described as good but his wife noted that he is active at night and "always fighting a battle." Regarding gait, poor stride length was noted for the past 3-4 months, as were symptoms of bradykinesia and difficulty getting out of a chair. No recent falls were reported at that time. He also denied any hallucinations, sensory loss, swallowing difficulties, or changes in voice/handwriting. ADLs were intact. At that time, Dr. Carles Mathis reported not seeing evidence of Parkinson's disease and felt that gait changes could be influenced by peripheral neuropathy. He was seen for follow-up via a virtual visit on 11/27/2018. He had started on a B12 supplement as advised. He  completed a formal driving evaluation in May 2020 and performed well. No driving restrictions were rendered.   Chase Mathis was not seen again by Dr. Carles Mathis until 12/28/2020. At that time, he reported intermittent shuffling  gait and his balance seemed to be worsening. He reported 2-3 falls in the past few weeks. One of these was at church which he felt was a depth perception issue. During another, he fell getting the mail and had to crawl to the nearest tree to help him regain his balance. His tremor was said to be "not worse." His wife noted that it becomes more noticeable when he is agitated or cannot get something accomplished. He and his wife reported occasional urinary incontinence. Depressive symptoms were said to be generally managed well; however, his wife wondered if he was taking his medications variably due to forgetfulness. His wife reported "noticeably worse" memory abilities during this visit. Ultimately, Chase Mathis was referred for a comprehensive neuropsychological evaluation to characterize his cognitive abilities and to assist with diagnostic clarity and treatment planning.   Brain MRI on 02/20/2006 revealed scattered T2 hyperintensities bilaterally, somewhat more prominent on the left than the right, likely related to chronic microvascular changes. Brain MRI on 01/05/2009 were not released. Brain MRI on 06/04/2018 were stable, with findings said to be age congruent. A Chase Mathis had been ordered at the time of the current evaluation.   Past Medical History:  Diagnosis Date   Benign non-nodular prostatic hyperplasia with lower urinary tract symptoms 12/21/2014   Chronic sinusitis 08/23/2017   Combined forms of age-related cataract of both eyes 06/15/2016   Elevated prostate specific antigen (PSA) 07/03/2011   Epiretinal membrane, bilateral 06/03/2015   Epistaxis 08/16/2017   History of electroconvulsive therapy    Hypertension    Left ureteral stone 01/28/2015   Chase Mathis-induced tremor    Major  depressive disorder in remission    Peripheral neuropathy    Recurrent nephrolithiasis 09/02/2017   Vitreous syneresis of both eyes 06/03/2015    Past Surgical History:  Procedure Laterality Date   CYSTOSCOPY WITH RETROGRADE PYELOGRAM, URETEROSCOPY AND STENT PLACEMENT Left 01/28/2015   Procedure: CYSTOSCOPY WITH LEFT RETROGRADE PYELOGRAM, LEFT URETEROSCOPY/STONE BASKETING  AND LEFT URETERAL  STENT PLACEMENT;  Surgeon: Carolan Clines, MD;  Location: [facility];  Service: Urology;  Laterality: Left;   SINUS IRRIGATION      Current Outpatient Medications:    amLODipine (NORVASC) 5 MG tablet, Take 5 mg by mouth daily. (Patient not taking: Reported on 12/28/2020), Disp: , Rfl:    finasteride (PROSCAR) 5 MG tablet, Take 1 tablet (5 mg total) by mouth daily. (Patient not taking: Reported on 12/28/2020), Disp: 90 tablet, Rfl: 3   lisinopril (PRINIVIL,ZESTRIL) 10 MG tablet, Take 20 mg by mouth daily. , Disp: , Rfl:    Chase Mathis carbonate 150 MG capsule, TAKE 3 CAPSULES BY MOUTH AT BEDTIME, Disp: 270 capsule, Rfl: 0   nortriptyline (PAMELOR) 50 MG capsule, TAKE 2 CAPSULES BY MOUTH AT BEDTIME, Disp: 180 capsule, Rfl: 0  Clinical Interview:   The following information was obtained during a clinical interview with Chase Mathis and his wife prior to cognitive testing.  Cognitive Symptoms: Decreased short-term memory: Endorsed. Chase Mathis was somewhat vague when describing generalized and occasional short-term memory dysfunction. His wife reported more pronounced concerns, noting that her husband will have trouble remembering details of prior events or activities. She also noted that he will seem to forget to change clothes on a daily basis. To this, he responded with "boys don't always do that" while laughing. Per his wife, memory dysfunction was said to particularly be noticeable over the past few months.  Decreased long-term memory: Denied. Decreased attention/concentration: Denied. However, his wife noted that  while  he used to be a voracious reader, he lately has been unable to finish books/novels, alluding to her perception that he has trouble with sustained attention. She also noted that he "likes doing 10 things at once," suggesting some trouble with distractibility. However, the latter behavior was not said to represent a significant change from his baseline.  Reduced processing speed: Denied. Chase Mathis noted that rather, his mind feels overactive or overstimulated at times.  Difficulties with executive functions: Endorsed. Acutely, primary difficulties were said to surround organization. His wife stated that they were in the process of moving and that while he attempts to help organize things, "everything he does has to be un-done" afterwards. Trouble with impulsivity or any overt personality changes were denied.  Difficulties with emotion regulation: Denied. Difficulties with receptive language: Denied. Difficulties with word finding: Denied. Decreased visuoperceptual ability: Denied.  Difficulties completing ADLs: Somewhat. Chase Mathis manages his medications. However, his wife did express concerns that he was forgetting to take his medications from time to time. She also alluded to occasionally finding dropped or misplaced pills. His wife reported that their daughter-in-law has taken over their personal finances. Back in 2020, Chase Mathis completed a formal driving evaluation and passed with no restrictions. Despite this, his wife has taken over driving responsibilities, stating concerns surrounding processing speed and reaction time.   Additional Medical History: History of traumatic brain injury/concussion: Endorsed. 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 He stated that one of these events led to him being knocked out "for a week." Persisting cognitive deficits from these events were denied. More recent head injuries were also  denied.  History of stroke: Denied. History of seizure activity: Denied. History of known exposure to toxins: Denied. Symptoms of chronic pain: Denied. Experience of frequent headaches/migraines: Denied. Frequent instances of dizziness/vertigo: Denied.  Sensory changes: He wears reading glasses with positive effect. He denied trouble with hearing. However, his wife did not fully agree with this and noted that it had been several years since he had his hearing evaluated. Other sensory changes/difficulties (e.g., taste or smell) were denied.  Balance/coordination difficulties: Endorsed. As stated above, there is a history of gait abnormalities including shuffling gait and bradykinesia. Recently, he acknowledged several falls, with one being as recent a few weeks prior. Falling behaviors were said to be sudden without any syncopal concerns or dizziness. As records stated above, Dr. Carles Mathis has theorized that gait difficulties are caused at least in part by symptoms of peripheral neuropathy.  Other motor difficulties: Endorsed. As stated above, there is a history of what is likely a Chase Mathis-induced tremor. Currently, Chase Mathis described occasional and very subtle tremulous behaviors in his right hand. Symptoms were said to have improved with time. He denied the presence of myoclonus or other abnormal/involuntary movements.  Sleep History: Estimated hours obtained each night: 7-8 hours.  Difficulties falling asleep: Denied. Difficulties staying asleep: Denied. Feels rested and refreshed upon awakening: Endorsed. His wife noted that he will occasionally nap during the day.   History of snoring: Denied. History of waking up gasping for air: Denied. Witnessed breath cessation while asleep: Denied.  History of vivid dreaming: Denied. Excessive movement while asleep: Endorsed. His wife reported that Chase Mathis is very active while asleep and will often hit or kick unintentionally. She also noted that he will  sometimes make vocalizations. Symptoms have been present since at least 2019, were described as mild overall, and were not described  as worsening over time.  Instances of acting out his dreams: Denied.  Psychiatric/Behavioral Health History: Depression: Chase Mathis reported a remote period of time lasting approximately four years where he experienced significant depressive symptoms. Typical treatments were ineffective, leading to him undergoing ECT treatments with good success. Since that time, depressive symptoms have been in remission. He has remained on Chase Mathis, which he also described as beneficial. He described his current mood as "good" which he wife agreed with. Current or remote suicidal ideation, intent, or plan was denied.  Anxiety: Denied. Mania: Denied. While he reported never being formally diagnosed with bipolar disorder, his wife did express her belief that he has this condition and perhaps passed it down to a few family members.  Trauma History: Denied. Visual/auditory hallucinations: Denied. Delusional thoughts: Denied.  Tobacco: Denied. Alcohol: He reported rare alcohol consumption specific to social settings. He denied a history of problematic alcohol abuse or dependence. Recreational drugs: Denied.  Family History: Problem Relation Age of Onset   Lung cancer Mother    Heart disease Father    Pancreatic cancer Sister    Healthy Son    Healthy Daughter    This information was confirmed by Chase Mathis.  Academic/Vocational History: Highest level of educational attainment: 20 years. 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 No relative weaknesses were reported.  History of developmental delay: Denied. History of grade repetition: Denied. Enrollment in special education courses: Denied. History of LD/ADHD: Denied.  Employment: Retired. He previously worked as a Dealer.   Evaluation Results:    Behavioral Observations: Chase Mathis was accompanied by his wife, arrived to his appointment on time, and was appropriately dressed and groomed. He appeared alert and oriented. Observed gait and station were mildly slowed but generally within normal limits. Gross motor functioning appeared intact upon informal observation and no abnormal movements (e.g., tremors) were noted. His affect was generally relaxed and positive. Spontaneous speech was fluent and word finding difficulties were not observed during the clinical interview. Thought processes were coherent, organized, and normal in content. Insight into his cognitive difficulties was difficult to determine as it was unclear if he was actively minimizing concerns and using humor as a defense mechanism or he was truly unaware of deficits/concerns raised by his wife.   During testing, he was mildly tangential and often made jokes. Mild word finding difficulties were only noted during a confrontation naming task. Sustained attention was appropriate. Task engagement was adequate and he persisted when challenged. He had some trouble understanding more complex tasks (TMT B, D-KEFS Color Word) and required additional repetition/clarification. Overall, Chase Mathis was cooperative with the clinical interview and subsequent testing procedures.   Adequacy of Effort: The validity of neuropsychological testing is limited by the extent to which the individual being tested may be assumed to have exerted adequate effort during testing. Chase Mathis expressed his intention to perform to the best of his abilities and exhibited adequate task engagement and persistence. Scores across stand-alone and embedded performance validity measures were within expectation. As such, the results of the current evaluation are believed to be a valid representation of Chase Mathis current cognitive functioning.  Test Results: Chase Mathis was largely oriented at the time of the current evaluation.  Points were lost for him being one day off when stating the current date.  Intellectual abilities based upon educational and vocational attainment were estimated to be in the above average range. Premorbid abilities  were estimated to be within the average range based upon a single-word reading test.   Processing speed was average to above average. Basic attention was average. More complex attention (e.g., working memory) was below average to average. He did perform in the well below average range on a task requiring him to rapidly detect changes in various stimuli (NAB Driving Scenes). Executive functioning was generally below average to average. He did have a large number of errors across a task assessing response inhibition. Her performed in the below average range across a task assessing safety and judgment.  Assessed receptive language abilities were above average. Likewise, Chase Mathis answered all questions asked of him appropriately during interview. Assessed expressive language (e.g., verbal fluency and confrontation naming) was below average.     Assessed visuospatial/visuoconstructional abilities were average to well above average. Points were lost on his drawing of a clock due to him only drawing one clock hand.    Learning (i.e., encoding) of novel verbal information was exceptionally low to well below average. Spontaneous delayed recall (i.e., retrieval) of previously learned information was also exceptionally low to well below average. Retention rates were 75% (raw score of 3) across a story learning task, 0% across a list learning task, and 0% across a figure drawing task. Performance across recognition tasks was exceptionally low across a visual task but below average to average across verbal tasks, suggesting some evidence for information consolidation.   Results of emotional screening instruments suggested that recent symptoms of generalized anxiety were in the minimal range, while  symptoms of depression were within normal limits. A screening instrument assessing recent sleep quality suggested the presence of minimal sleep dysfunction.  Tables of Scores:   Note: This summary of test scores accompanies the interpretive report and should not be considered in isolation without reference to the appropriate sections in the text. Descriptors are based on appropriate normative data and may be adjusted based on clinical judgment. Terms such as "Within Normal Limits" and "Outside Normal Limits" are used when a more specific description of the test score cannot be determined.       Percentile - Normative Descriptor > 98 - Exceptionally High 91-97 - Well Above Average 75-90 - Above Average 25-74 - Average 9-24 - Below Average 2-8 - Well Below Average < 2 - Exceptionally Low       Validity:   DESCRIPTOR       Dot Counting Test: --- --- Within Normal Limits  RBANS Effort Index: --- --- Within Normal Limits  WAIS-IV Reliable Digit Span: --- --- Within Normal Limits  D-KEFS Color Word Effort Index: --- --- Within Normal Limits       Orientation:      Raw Score Percentile   NAB Orientation, Form 1 28/29 --- ---       Cognitive Screening:      Raw Score Percentile   SLUMS: 17/30 --- ---       RBANS, Form A: Standard Score/ Scaled Score Percentile   Total Score 80 9 Below Average  Immediate Memory 61 <1 Exceptionally Low    List Learning 3 1 Exceptionally Low    Story Memory 4 2 Well Below Average  Visuospatial/Constructional 109 73 Average    Figure Copy 14 91 Well Above Average    Line Orientation 15/20 26-50 Average  Language 86 18 Below Average    Picture Naming 10/10 >75 High Average    Semantic Fluency 4 2 Well Below Average  Attention 97 42 Average  Digit Span 9 37 Average    Coding 10 50 Average  Delayed Memory 68 2 Well Below Average    List Recall 0/10 <2 Exceptionally Low    List Recognition 18/20 17-25 Below Average to Average    Story Recall 5 5  Well Below Average    Story Recognition 8/12 14-28 Below Average to Average    Figure Recall 1 <1 Exceptionally Low    Figure Recognition 1/8 <1 Exceptionally Low       Intellectual Functioning:      Standard Score Percentile   Test of Premorbid Functioning: 103 58 Average       Attention/Executive Function:     Trail Making Test (TMT): Raw Score (Scaled Score) Percentile     Part A 42 secs.,  1 error (11) 63 Average    Part B 193 secs.,  5 errors (7) 16 Below Average  *Based on [employer] Older Normative Studies (MOANS)          NAB Attention Module, Form 1: T Score Percentile     Driving Scenes 29 2 Well Below Average        Scaled Score Percentile   WAIS-IV Digit Span: 8 25 Average    Forward 11 63 Average    Backward 9 37 Average    Sequencing 6 9 Below Average        Scaled Score Percentile   WAIS-IV Similarities: 10 50 Average       D-KEFS Color-Word Interference Test: Raw Score (Scaled Score) Percentile     Color Naming 42 secs. (8) 25 Average    Word Reading 22 secs. (12) 75 Above Average    Inhibition 88 secs. (10) 50 Average      Total Errors 8 errors (6) 9 Below Average    Inhibition/Switching 91 secs. (11) 63 Average      Total Errors 18 errors (1) <1 Exceptionally Low       NAB Executive Functions Module, Form 1: T Score Percentile     Judgment 41 18 Below Average       Language:     Verbal Fluency Test: Raw Score (Scaled Score) Percentile     Phonemic Fluency (CFL) 21 (7) 16 Below Average    Category Fluency 27 (7) 16 Below Average  *Based on [employer] Older Normative Studies (MOANS)          NAB Language Module, Form 1: T Score Percentile     Auditory Comprehension 57 75 Above Average    Naming 27/31 (38) 12 Below Average       Visuospatial/Visuoconstruction:      Raw Score Percentile   Clock Drawing: 7/10 --- Within Normal Limits        Scaled Score Percentile   WAIS-IV Block Design: 12 75 Above Average       Mood and Personality:      Raw Score  Percentile   Geriatric Depression Scale: 4 --- Within Normal Limits  Geriatric Anxiety Scale: 10 --- Minimal    Somatic 5 --- Minimal    Cognitive 2 --- Minimal    Affective 3 --- Minimal       Additional Questionnaires:      Raw Score Percentile   PROMIS Sleep Disturbance Questionnaire: 18 --- None to Slight   Informed Consent and Coding/Compliance:   The current evaluation represents a clinical evaluation for the purposes previously outlined by the referral source and is in no way reflective of a forensic evaluation.   Dr.  Konz was provided with a verbal description of the nature and purpose of the present neuropsychological evaluation. Also reviewed were the foreseeable risks and/or discomforts and benefits of the procedure, limits of confidentiality, and mandatory reporting requirements of this provider. The patient was given the opportunity to ask questions and receive answers about the evaluation. Oral consent to participate was provided by the patient.   This evaluation was conducted by Christia Reading, Ph.D., licensed clinical neuropsychologist. Chase Mathis completed a clinical interview with Dr. Melvyn Novas, billed as one unit [phone], and 130 minutes of cognitive testing and scoring, billed as one unit [phone] and three additional units [id number]. Psychometrist Milana Kidney, B.S., assisted Dr. Melvyn Novas with test administration and scoring procedures. As a separate and discrete service, Dr. Melvyn Novas spent a total of 160 minutes in interpretation and report writing billed as one unit [phone] and two units [id number].

## 2021-01-04 NOTE — Progress Notes (Signed)
Called patient and informed him of labs results and recommendations. Patient thanked [place] for the call and had no further questions or concerns.

## 2021-01-05 ENCOUNTER — Encounter: Payer: Self-pay | Admitting: Psychology

## 2021-01-05 DIAGNOSIS — G3184 Mild cognitive impairment, so stated: Secondary | ICD-10-CM

## 2021-01-05 HISTORY — DX: Mild cognitive impairment of uncertain or unknown etiology: G31.84

## 2021-01-10 ENCOUNTER — Ambulatory Visit (INDEPENDENT_AMBULATORY_CARE_PROVIDER_SITE_OTHER): Payer: Medicare Other | Admitting: Psychology

## 2021-01-10 ENCOUNTER — Other Ambulatory Visit: Payer: Self-pay

## 2021-01-10 DIAGNOSIS — G3184 Mild cognitive impairment, so stated: Secondary | ICD-10-CM

## 2021-01-10 DIAGNOSIS — G251 Drug-induced tremor: Secondary | ICD-10-CM

## 2021-01-10 NOTE — Progress Notes (Signed)
   Neuropsychology Feedback Session [name]. [place] [facility] of Neurology  Reason for Referral:   Chase Mathis is a 87 y.o. right-handed Caucasian male referred by Alonza Bogus, D.O., to characterize his current cognitive functioning and assist with diagnostic clarity and treatment planning in the context of tremor, gait abnormalities, and subjective cognitive decline.  Feedback:   Dr. Ronnie Derby completed a comprehensive neuropsychological evaluation on 01/04/2021. Please refer to that encounter for the full report and recommendations. Briefly, results suggested a primary impairment surrounding both encoding (i.e., learning) and retrieval aspects of memory. Performance across yes/no recognition trials on memory tests were variable, ranging from the exceptionally low to average normative ranges. Outside of this, performances were generally below average to average. While this would be appropriate relative to age-matched peers, I do believe that he has exhibited some fairly diffuse and overall mild decline given his vocational history and level of educational attainment. The etiology of cognitive dysfunction is unclear as Dr. Ronnie Derby produced a fairly nonspecific pattern of impairment and concern for mild decline. It is worth highlighting that trouble with forgetfulness and distractibility can be seen in individuals currently taking Lithium. However, outside of instances where documented Lithium toxicity has occurred, these cognitive changes are generally mild. Continued consideration surrounding Parkinson's disease seems reasonable as he exhibits shuffling gait, balance concerns, and some tremors. However, it certainly remains possible that these symptoms are medication induced (i.e., Lithium) and the results of his currently ordered DaTscan will be enlightening.  Dr. Ronnie Derby was accompanied by his wife during the current feedback session. Content of the current session focused on the  results of his neuropsychological evaluation. Dr. Ronnie Derby and his wife were given the opportunity to ask questions and their questions were answered. They were encouraged to reach out should additional questions arise. A copy of his report was provided at the conclusion of the visit.      30 minutes were spent conducting the current feedback session with [name], billed as one unit [phone].

## 2021-01-11 ENCOUNTER — Telehealth: Payer: Self-pay | Admitting: Neurology

## 2021-01-11 DIAGNOSIS — R270 Ataxia, unspecified: Secondary | ICD-10-CM

## 2021-01-11 NOTE — Telephone Encounter (Signed)
Pt called in and left a message stating he needs a referral to the [facility] place at [place] in [facility]

## 2021-01-12 NOTE — Telephone Encounter (Signed)
Called patient and was unable to leave a message due to voicemail box not being set up.   Need to inform patient that his referral has been faxed.

## 2021-01-12 NOTE — Telephone Encounter (Signed)
Called patient and informed him that his Driving Evaluator referral has been sent as requested. Patient stated that they already called him. Patient thanked me for the call and had no further questions or concerns.

## 2021-01-13 ENCOUNTER — Telehealth: Payer: Self-pay | Admitting: Neurology

## 2021-01-13 NOTE — Telephone Encounter (Signed)
Patient called requesting a call back from a clinical staff member. He'd like to talk about his driving restriction.

## 2021-01-13 NOTE — Telephone Encounter (Signed)
Called patient and patients wife answered and hung up.

## 2021-01-17 NOTE — Telephone Encounter (Signed)
Called patient and was unable to leave a voicemail due to mailbox not being set up.

## 2021-01-17 NOTE — Telephone Encounter (Signed)
Pt is returning mahinas call. He has questions regarding his visit. MRI that he said has not been done,but was supposed to. He also questioning a DAT scan

## 2021-01-18 NOTE — Telephone Encounter (Signed)
He DID do the study for me, as recommended in 2019.  I don't see an indication for another right now.

## 2021-01-18 NOTE — Telephone Encounter (Signed)
Called patient and he wanted to know if Dr. Carles Collet wanted him to still have a MRI that was discussed in 2020. He knows he has the Dat Scan and wanted to know if he needed the MRI as well since it was reccommended in 2020 and he never had it done. Patient is ok with having both test completed.   I informed patient that I would send Dr. Carles Collet a message to ask and give him a call once I hear back. Patient verbalized understanding and had no further questions or concerns.

## 2021-01-19 NOTE — Telephone Encounter (Signed)
Called patient and left a message for a call back.  

## 2021-01-20 NOTE — Telephone Encounter (Signed)
Called patient and informed him that Dr. Carles Collet does not have a need for a MRI at this time. Patient verbalized understanding and had no further questions or concerns

## 2021-02-04 ENCOUNTER — Telehealth: Payer: Self-pay | Admitting: Neurology

## 2021-02-07 NOTE — Telephone Encounter (Signed)
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

## 2021-02-08 ENCOUNTER — Ambulatory Visit (HOSPITAL_COMMUNITY)
Admission: RE | Admit: 2021-02-08 | Discharge: 2021-02-08 | Disposition: A | Discharge status: Home / Self Care | Payer: Medicare Other | Source: Ambulatory Visit | Attending: Neurology | Admitting: Neurology

## 2021-02-08 ENCOUNTER — Other Ambulatory Visit: Payer: Self-pay

## 2021-02-08 DIAGNOSIS — R251 Tremor, unspecified: Secondary | ICD-10-CM | POA: Diagnosis not present

## 2021-02-08 DIAGNOSIS — G20 Parkinson's disease: Secondary | ICD-10-CM | POA: Diagnosis not present

## 2021-02-08 MED ORDER — POTASSIUM IODIDE (ANTIDOTE) 130 MG PO TABS: 130.0000 mg | ORAL_TABLET | Freq: Once | ORAL | Status: AC

## 2021-02-08 MED ORDER — POTASSIUM IODIDE (ANTIDOTE) 130 MG PO TABS
ORAL_TABLET | ORAL | Status: AC
  Administered 2021-02-08: 130 mg via ORAL
  Filled 2021-02-08: qty 1

## 2021-02-08 MED ORDER — IOFLUPANE I 123 185 MBQ/2.5ML IV SOLN
4.8000 | Freq: Once | INTRAVENOUS | Status: AC | PRN
  Administered 2021-02-08: 4.8 via INTRAVENOUS
  Filled 2021-02-08: qty 5

## 2021-02-09 ENCOUNTER — Telehealth: Payer: Self-pay | Admitting: Neurology

## 2021-02-09 ENCOUNTER — Other Ambulatory Visit: Payer: Self-pay | Admitting: Psychiatry

## 2021-02-09 DIAGNOSIS — F3342 Major depressive disorder, recurrent, in full remission: Secondary | ICD-10-CM

## 2021-02-09 NOTE — Telephone Encounter (Signed)
Please call patient and let him know that his DaT scan was equivocal (wasn't positive or negative).  Regardless, he meets no criteria right now for Parkinsons Disease.  We will need to watch him with the course of time.  Can discuss more at appt if he has questions.

## 2021-02-09 NOTE — Telephone Encounter (Signed)
Dr Ronnie Derby called and informed that his DaT scan was equivocal (wasn't positive or negative).  Regardless, he meets no criteria right now for Parkinsons Disease.  We will need to watch him with the course of time.  Can discuss more at appt if he has questions. Pt informed of his appointment date and time

## 2021-02-10 ENCOUNTER — Ambulatory Visit (INDEPENDENT_AMBULATORY_CARE_PROVIDER_SITE_OTHER): Payer: Medicare Other | Admitting: Psychiatry

## 2021-02-10 ENCOUNTER — Other Ambulatory Visit: Payer: Self-pay

## 2021-02-10 ENCOUNTER — Encounter: Payer: Self-pay | Admitting: Psychiatry

## 2021-02-10 DIAGNOSIS — F3342 Major depressive disorder, recurrent, in full remission: Secondary | ICD-10-CM

## 2021-02-10 DIAGNOSIS — Z79899 Other long term (current) drug therapy: Secondary | ICD-10-CM | POA: Diagnosis not present

## 2021-02-10 DIAGNOSIS — E538 Deficiency of other specified B group vitamins: Secondary | ICD-10-CM | POA: Diagnosis not present

## 2021-02-10 DIAGNOSIS — G3184 Mild cognitive impairment, so stated: Secondary | ICD-10-CM | POA: Diagnosis not present

## 2021-02-10 DIAGNOSIS — G251 Drug-induced tremor: Secondary | ICD-10-CM

## 2021-02-10 MED ORDER — LITHIUM CARBONATE 150 MG PO CAPS: 450.0000 mg | ORAL_CAPSULE | Freq: Every day | ORAL | 0 refills | Status: DC

## 2021-02-10 MED ORDER — NORTRIPTYLINE HCL 50 MG PO CAPS: 50.0000 mg | ORAL_CAPSULE | Freq: Every day | ORAL | 1 refills | Status: DC

## 2021-02-10 NOTE — Progress Notes (Signed)
Chase Mathis [id number] [birth year] 87 y.o.  Subjective:   Patient ID:  Chase Mathis is a 87 y.o. (DOB 1933/08/11) male.  Chief Complaint:  Chief Complaint  Patient presents with   Follow-up   Depression    Depression        Associated symptoms include no decreased concentration and no suicidal ideas. Chase Mathis presents to the office today for follow-up of recurrence of depression that occurred at the end of 2019.  He responded to an increase in lithium from 300 to 450 mg daily..  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  He had an early relapse while on nortriptyline alone and lithium was added and he had booster ECTs and has been in remission since that time.  seen in November 2019.  His depression had gone into remission again with increase in lithium from 300 to 450 mg daily.  No med changes were made at that time.  02/2019 appt : Continue lithium 150 mg capsules 3 daily Continue nortriptyline 50 mg capsules 2 nightly  11/30/20 lab and TC ;His nortriptyline level is 303 and should be 150 or lower.  He is on 2 of the 50 mg capsules.  Tell him to cut the dose back to one of the 50 mg capsules.  He has an appointment in August.  Let them know if he has any problem with the dosage reduction and to let us know.  02/10/2021 appointment following noted: No problems with less nortriptyline to 50 mg nightly. Fine until 2-3 mos ago.  Was working in back yard and got weak and fell and couldn't get up.  Hasn't happened again. Saw neuro Dr. Carles Collet. No PD and probably lithium tremor.  Equivical DaTscan. Had neuropsych testing and dx with MCI. No further episodes of this.    I'm fine now.  Remained in remission.  Patient reports stable mood and denies depressed or irritable moods.  Patient denies any recent difficulty with anxiety.  Patient denies difficulty with sleep initiation but occ maintenance with work dreams  with anxiety.  Benadryl helps. Denies appetite disturbance.  Patient reports that energy and motivation have been good.  Patient denies any difficulty with concentration.  Patient denies any suicidal ideation.  Minimal tremor.  Past Psychiatric Medication Trials:  ECT  Last November 2018, we reduced the nortriptyline from 150 mg to 100 mg daily and reduced the lithium from 450 mg a day to 300 mg daily because of some tremor and because he wanted to try to reduce the medication.  His blood levels on the higher dosages were lithium 0.7 nortriptyline 103.   On the lower dosages the level in October were:  Li 0.4.  Nortriptyline 224.  Review of Systems:  Review of Systems  Neurological:  Positive for tremors. Negative for weakness.  Psychiatric/Behavioral:  Negative for agitation, behavioral problems, confusion, decreased concentration, dysphoric mood, hallucinations, self-injury, sleep disturbance and suicidal ideas. The patient is not nervous/anxious and is not hyperactive.    Medications: I have reviewed the patient's current medications.  Current Outpatient Medications  Medication Sig Dispense Refill   amLODipine (NORVASC) 5 MG tablet Take 5 mg by mouth daily.     finasteride (PROSCAR) 5 MG tablet Take 1 tablet (5 mg total) by mouth daily. 90 tablet 3   lisinopril (PRINIVIL,ZESTRIL) 10 MG tablet Take 10 mg by mouth daily.     lithium carbonate 150 MG capsule  TAKE 3 CAPSULES BY MOUTH AT BEDTIME 270 capsule 0   nortriptyline (PAMELOR) 50 MG capsule TAKE 2 CAPSULES BY MOUTH AT BEDTIME 180 capsule 0   vitamin B-12 (CYANOCOBALAMIN) 50 MCG tablet Take 50 mcg by mouth daily.     No current facility-administered medications for this visit.    Medication Side Effects: None  Allergies: No Known Allergies  Past Medical History:  Diagnosis Date   Benign non-nodular prostatic hyperplasia with lower urinary tract symptoms 12/21/2014   Chronic sinusitis 08/23/2017   Combined forms of age-related  cataract of both eyes 06/15/2016   Elevated prostate specific antigen (PSA) 07/03/2011   Epiretinal membrane, bilateral 06/03/2015   Epistaxis 08/16/2017   History of electroconvulsive therapy    Hypertension    Left ureteral stone 01/28/2015   Lithium-induced tremor    Major depressive disorder in remission    MCI (mild cognitive impairment) with memory loss 01/05/2021   Peripheral neuropathy    Recurrent nephrolithiasis 09/02/2017   Vitreous syneresis of both eyes 06/03/2015    Family History  Problem Relation Age of Onset   Lung cancer Mother    Heart disease Father    Pancreatic cancer Sister    Healthy Son    Healthy Daughter     Social History   Socioeconomic History   Marital status: Married    Spouse name: Not on file   Number of children: Not on file   Years of education: 70   Highest education level: Professional school degree (e.g., MD, DDS, DVM, JD)  Occupational History   Occupation: retired    Comment: urologist  Tobacco Use   Smoking status: Never   Smokeless tobacco: Never  Substance and Sexual Activity   Alcohol use: Yes    Comment: very rare   Drug use: No   Sexual activity: Yes  Other Topics Concern   Not on file  Social History Narrative   Right handed   Drinks caffeine    Two story home   Social Determinants of Health   Financial Resource Strain: Not on file  Food Insecurity: Not on file  Transportation Needs: Not on file  Physical Activity: Not on file  Stress: Not on file  Social Connections: Not on file  Intimate Partner Violence: Not on file    Past Medical History, Surgical history, Social history, and Family history were reviewed and updated as appropriate.   Please see review of systems for further details on the patient's review from today.   Objective:   Physical Exam:  There were no vitals taken for this visit.  Physical Exam Constitutional:      General: He is not in acute distress.    Appearance: He is  well-developed.  Musculoskeletal:        General: No deformity.  Neurological:     Mental Status: He is alert and oriented to person, place, and time.     Motor: No tremor.     Coordination: Coordination normal.     Gait: Gait normal.  Psychiatric:        Attention and Perception: Attention and perception normal.        Mood and Affect: Mood is not anxious or depressed. Affect is not labile, blunt, angry or inappropriate.        Speech: Speech normal.        Behavior: Behavior normal.        Thought Content: Thought content normal. Thought content does not include homicidal or suicidal ideation. Thought  content does not include homicidal or suicidal plan.        Cognition and Memory: Cognition normal.        Judgment: Judgment normal.     Comments: Insight intact. No auditory or visual hallucinations. No delusions.     Lab Review:     Component Value Date/Time   NA 138 03/25/2019 1101   K 5.1 03/25/2019 1101   CL 101 03/25/2019 1101   CO2 20 03/25/2019 1101   GLUCOSE 99 03/25/2019 1101   GLUCOSE 81 04/18/2018 0937   BUN 20 03/25/2019 1101   CREATININE 1.14 03/25/2019 1101   CREATININE 1.18 (H) 04/18/2018 0937   CALCIUM 9.7 03/25/2019 1101   PROT 7.5 12/19/2016 2211   ALBUMIN 4.4 12/19/2016 2211   AST 29 12/19/2016 2211   ALT 42 12/19/2016 2211   ALKPHOS 54 12/19/2016 2211   BILITOT 0.7 12/19/2016 2211   GFRNONAA 59 (L) 03/25/2019 1101   GFRAA 68 03/25/2019 1101       Component Value Date/Time   WBC 12.5 (H) 12/19/2016 2211   RBC 5.19 12/19/2016 2211   HGB 15.6 12/19/2016 2211   HCT 46.2 12/19/2016 2211   PLT 265 12/19/2016 2211   MCV 89.0 12/19/2016 2211   MCH 30.1 12/19/2016 2211   MCHC 33.8 12/19/2016 2211   RDW 14.1 12/19/2016 2211   LYMPHSABS 1.3 12/19/2016 2211   MONOABS 0.9 12/19/2016 2211   EOSABS 0.1 12/19/2016 2211   BASOSABS 0.0 12/19/2016 2211    Lithium Lvl  Date Value Ref Range Status  03/25/2019 0.5 (L) 0.6 - 1.2 mmol/L Final    Comment:                                      Detection Limit = 0.1                           <0.1 indicates None Detected    Was on '300mg'$  daily .  No results found for: PHENYTOIN, PHENOBARB, VALPROATE, CBMZ   .res Assessment: Plan:    Recurrent major depression in full remission ([place])  Lithium use  Lithium-induced tremor  MCI (mild cognitive impairment) with memory loss  B12 deficiency   Fortunately Dr. Ruel Favors depression has remitted fairly quickly after the addition of the extra lithium from '300mg'$  daily to '450mg'$  daily in the fall 2019.  He clearly needs this dosage of lithium and it should continue to be effective for him.  Discussed the Dr.Post articles on lithium.  He was having tremor at the last visit but it resolved with time and no treatment needed.  His nortriptyline level was high in June and cut in half without complication thus far.  Disc MCI dx.  Discussed side effects of both these medications. Counseled patient regarding potential benefits, risks, and side effects of lithium to include potential risk of lithium affecting thyroid and renal function.  Discussed need for periodic lab monitoring to determine drug level and to assess for potential adverse effects.  Counseled patient regarding signs and symptoms of lithium toxicity and advised that they notify office immediately or seek urgent medical attention if experiencing these signs and symptoms.  Patient advised to contact office with any questions or concerns. Get lithium level and nortriptyline level again.  Because of good response and a prior history of relapse no med changes should be pursued. Continue  lithium 150 mg capsules 3 daily Continue nortriptyline 50 mg capsules 1 nightly  Discussed side effects of nortriptyline which could include anticholinergic side effects because of his age could include symptoms like confusion.  He has no such side effects at this time.  Contact us if you have the symptoms or any other  symptoms such as sedation etc.  This is a 30-minute appointment  Follow-up 6 months or sooner if there is any recurrence.  Lynder Parents MD, DFAPA  Please see After Visit Summary for patient specific instructions.  Future Appointments  Date Time Provider [place]  03/08/2021 11:15 AM Tat, Eustace Quail, DO [facility] None    No orders of the defined types were placed in this encounter.     -------------------------------

## 2021-02-10 NOTE — Patient Instructions (Signed)
Check lithium level and nortriptyline level at a [facility] in the next month or so.

## 2021-03-07 DIAGNOSIS — Z20828 Contact with and (suspected) exposure to other viral communicable diseases: Secondary | ICD-10-CM | POA: Diagnosis not present

## 2021-03-07 NOTE — Progress Notes (Signed)
Assessment/Plan:   1.  Gait instabity, likely due to peripheral neuropathy  -on b12 supplement for def             -Patient is also on nortriptyline, which is an anticholinergic and certainly can contribute to gait instability, although he has not had a large dose.    -He has hypomimia.  Did not see this in the office, but he is DaTscan was equivocal.  Patient, wife, daughter.  He does not meet clinical criteria for Parkinson's disease.  I did offer them levodopa to see if it would help gait, but ultimately they decided to hold on that.  I agree.   2.  B12 deficiency             -Patient now on supplementation.   3.  Tremor             -This is likely due to the lithium.  Studies are varied, but incidate that up to 2/3 of patients on lithium will have lithium-induced tremor, even if the patients are not toxic on the medication.  This is just a common side effect of patients on lithium.  It doesn't particularly bother him and is very mild.  The benefits of the medication outweigh the SE of tremor.   4.  Memory change  -Neurocognitive testing done January 05, 1999 2010 was with evidence of MCI only.  Felt that this could be due to medication (nortriptyline, lithium).  He advised a repeat driving evaluation.  He is scheduled for that in February, 2023.  5.  F/u 1 year Subjective:   PHARIS KRUPPA was seen today in neurologic follow-up.  Records reviewed since last visit.  Daughter on phone and supplements the history.  Patient's B12 was re-checked since last visit and was quite low at 168.  His homocystine was correspondingly elevated.  Patient was told to restart his supplement.  DaTscan was equivocal.  Patient had neurocognitive testing with Dr. Melvyn Novas, demonstrating mild cognitive impairment.  This was done on July 12.  Repeat driving evaluation was recommended and we sent a referral for that.  I did not see that he was scheduled for that, so we called [facility] yesterday.  They are booked out  significantly and he is scheduled for that in February, 2023.  Pt states that he shuffles if he doesn't think about it.  No falls.  Daughter states that   Last saw Dr. Clovis Pu February 10, 2021.  Records reviewed.   ALLERGIES:  No Known Allergies  CURRENT MEDICATIONS:  Outpatient Encounter Medications as of 03/08/2021  Medication Sig   amLODipine (NORVASC) 5 MG tablet Take 5 mg by mouth daily.   finasteride (PROSCAR) 5 MG tablet Take 1 tablet (5 mg total) by mouth daily.   lisinopril (PRINIVIL,ZESTRIL) 10 MG tablet Take 10 mg by mouth daily.   lithium carbonate 150 MG capsule Take 3 capsules (450 mg total) by mouth at bedtime.   nortriptyline (PAMELOR) 50 MG capsule Take 1 capsule (50 mg total) by mouth at bedtime.   vitamin B-12 (CYANOCOBALAMIN) 50 MCG tablet Take 50 mcg by mouth daily.   No facility-administered encounter medications on file as of 03/08/2021.    Objective:   PHYSICAL EXAMINATION:    VITALS:   Vitals:   03/08/21 1112  BP: (!) 143/65  Pulse: 65  Resp: (!) 97  Weight: 221 lb 3.2 oz (100.3 kg)  Height: '5\' 5"'$  (1.651 m)     GEN:  Normal appears male  in no acute distress.  Appears younger than stated age. HEENT:  Normocephalic, atraumatic. The mucous membranes are moist.   NEUROLOGICAL: Orientation: The patient is alert and oriented x3.  Cranial nerves: There is good facial symmetry.  Extraocular muscles are intact. The visual fields are full to confrontational testing. The speech is fluent and clear. Soft palate rises symmetrically and there is no tongue deviation. Hearing is intact to conversational tone. Sensation: Sensation is intact to light touch throughout. Motor: Strength is 5/5 in the bilateral upper and lower extremities.   Shoulder shrug is equal and symmetric.  There is no pronator drift.    Movement examination: Tone: There is normal tone in the bilateral upper extremities.  The tone in the lower extremities is normal.  He does, however, have  significant difficulty relaxing Abnormal movements: There is no rest tremor than is seen.  Rest tremor is felt with distraction.  He does, however, having intention and postural tremor.  It is very mild. Coordination:  There is no decremation with RAM's, with any form of RAMS, including alternating supination and pronation of the forearm, hand opening and closing, finger taps, heel taps and toe taps. Gait and Station: The patient has no difficulty arising out of a deep-seated chair without the use of the hands. The patient ambulates well in the hallway.  He does admit that he was trying to pick up his feet.    Chemistry      Component Value Date/Time   NA 138 03/25/2019 1101   K 5.1 03/25/2019 1101   CL 101 03/25/2019 1101   CO2 20 03/25/2019 1101   BUN 20 03/25/2019 1101   CREATININE 1.14 03/25/2019 1101   CREATININE 1.18 (H) 04/18/2018 0937      Component Value Date/Time   CALCIUM 9.7 03/25/2019 1101   ALKPHOS 54 12/19/2016 2211   AST 29 12/19/2016 2211   ALT 42 12/19/2016 2211   BILITOT 0.7 12/19/2016 2211     Lab Results  Component Value Date   VITAMINB12 168 (L) 12/28/2020      Total time spent on today's visit was 30 minutes, including both face-to-face time and nonface-to-face time.  Time included that spent on review of records (prior notes available to me/labs/imaging if pertinent), discussing treatment and goals, answering patient's questions and coordinating care.   Cc:  Lajean Manes, MD

## 2021-03-08 ENCOUNTER — Other Ambulatory Visit: Payer: Self-pay

## 2021-03-08 ENCOUNTER — Encounter: Payer: Self-pay | Admitting: Neurology

## 2021-03-08 ENCOUNTER — Ambulatory Visit (INDEPENDENT_AMBULATORY_CARE_PROVIDER_SITE_OTHER): Payer: Medicare Other | Admitting: Neurology

## 2021-03-08 VITALS — BP 143/65 | HR 65 | Resp 97 | Ht 65.0 in | Wt 221.2 lb

## 2021-03-08 DIAGNOSIS — G3184 Mild cognitive impairment, so stated: Secondary | ICD-10-CM | POA: Diagnosis not present

## 2021-03-08 DIAGNOSIS — G251 Drug-induced tremor: Secondary | ICD-10-CM | POA: Diagnosis not present

## 2021-03-08 NOTE — Patient Instructions (Signed)
We discussed that you do not meet clinical criteria for Parkinsons Disease but was willing to trial carbidopa/levodopa 25/100 if you would like.  You decided to hold on that for now.  I think that is a fine decision.  We discussed the importance of safe, cardiovascular exercise.  The physicians and staff at [facility] are committed to providing excellent care. You may receive a survey requesting feedback about your experience at our office. We strive to receive "very good" responses to the survey questions. If you feel that your experience would prevent you from giving the office a "very good " response, please contact our office to try to remedy the situation. We may be reached at [phone]. Thank you for taking the time out of your busy day to complete the survey.

## 2021-03-14 DIAGNOSIS — Z20828 Contact with and (suspected) exposure to other viral communicable diseases: Secondary | ICD-10-CM | POA: Diagnosis not present

## 2021-03-21 DIAGNOSIS — Z20828 Contact with and (suspected) exposure to other viral communicable diseases: Secondary | ICD-10-CM | POA: Diagnosis not present

## 2021-04-12 DIAGNOSIS — Z20822 Contact with and (suspected) exposure to covid-19: Secondary | ICD-10-CM | POA: Diagnosis not present

## 2021-04-18 DIAGNOSIS — Z23 Encounter for immunization: Secondary | ICD-10-CM | POA: Diagnosis not present

## 2021-04-25 ENCOUNTER — Ambulatory Visit (INDEPENDENT_AMBULATORY_CARE_PROVIDER_SITE_OTHER): Payer: Medicare Other | Admitting: Psychiatry

## 2021-04-25 ENCOUNTER — Other Ambulatory Visit: Payer: Self-pay

## 2021-04-25 ENCOUNTER — Encounter: Payer: Self-pay | Admitting: Psychiatry

## 2021-04-25 DIAGNOSIS — F3342 Major depressive disorder, recurrent, in full remission: Secondary | ICD-10-CM

## 2021-04-25 DIAGNOSIS — Z79899 Other long term (current) drug therapy: Secondary | ICD-10-CM | POA: Diagnosis not present

## 2021-04-25 DIAGNOSIS — E538 Deficiency of other specified B group vitamins: Secondary | ICD-10-CM | POA: Diagnosis not present

## 2021-04-25 DIAGNOSIS — G3184 Mild cognitive impairment, so stated: Secondary | ICD-10-CM

## 2021-04-25 DIAGNOSIS — G251 Drug-induced tremor: Secondary | ICD-10-CM

## 2021-04-25 NOTE — Progress Notes (Signed)
JAHZIEL SINN [id number] 10-21-1933 87 y.o.  Subjective:   Patient ID:  Chase Mathis is a 87 y.o. (DOB 08-21-1933) male.  Chief Complaint:  Chief Complaint  Patient presents with   Follow-up   Depression    Depression        Associated symptoms include no decreased concentration and no suicidal ideas. Chase Mathis presents to the office today for follow-up of recurrence of depression that occurred at the end of 2019.  He responded to an increase in lithium from 300 to 450 mg daily..  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  He had an early relapse while on nortriptyline alone and lithium was added and he had booster ECTs and has been in remission since that time.  seen in November 2019.  His depression had gone into remission again with increase in lithium from 300 to 450 mg daily.  No med changes were made at that time.  02/2019 appt : Continue lithium 150 mg capsules 3 daily Continue nortriptyline 50 mg capsules 2 nightly  11/30/20 lab and TC ;His nortriptyline level is 303 and should be 150 or lower.  He is on 2 of the 50 mg capsules.  Tell him to cut the dose back to one of the 50 mg capsules.  He has an appointment in August.  Let them know if he has any problem with the dosage reduction and to let us know.  02/10/2021 appointment following noted: No problems with less nortriptyline to 50 mg nightly. Fine until 2-3 mos ago.  Was working in back yard and got weak and fell and couldn't get up.  Hasn't happened again. Saw neuro Dr. Carles Collet. No PD and probably lithium tremor.  Equivical DaTscan. Had neuropsych testing and dx with MCI. No further episodes of this.   I'm fine now.  Remained in remission.  Patient reports stable mood and denies depressed or irritable moods.  Patient denies any recent difficulty with anxiety.  Patient denies difficulty with sleep initiation but occ maintenance with work dreams with  anxiety.  Benadryl helps. Denies appetite disturbance.  Patient reports that energy and motivation have been good.  Patient denies any difficulty with concentration.  Patient denies any suicidal ideation. Minimal tremor.  04/25/2021 appointment with the following noted: Did not get lithium level and labs as requested. [place].  Didn't think it would be a problem.  Small place.  Younger than others.He doesn't like it bc of the change similar to what he had after moving to [place] years ago leading to a severe prolonged depression.  Feels he made a mistake but trying to ease up on his thinking bc he realizes it's not all about him. Dispersed his furniture.  Thinks he's adjusting better.   Once weekly goes to men's group of 50 mixed race.  Involved in other things.      Past Psychiatric Medication Trials:  ECT  Last November 2018, we reduced the nortriptyline from 150 mg to 100 mg daily and reduced the lithium from 450 mg a day to 300 mg daily because of some tremor and because he wanted to try to reduce the medication.  His blood levels on the higher dosages were lithium 0.7 nortriptyline 103.   On the lower dosages the level in October were:  Li 0.4.  Nortriptyline 224.  Review of Systems:  Review of Systems  Cardiovascular:  Negative for  palpitations.  Neurological:  Positive for tremors. Negative for weakness.  Psychiatric/Behavioral:  Negative for agitation, behavioral problems, confusion, decreased concentration, dysphoric mood, hallucinations, self-injury, sleep disturbance and suicidal ideas. The patient is not nervous/anxious and is not hyperactive.    Medications: I have reviewed the patient's current medications.  Current Outpatient Medications  Medication Sig Dispense Refill   amLODipine (NORVASC) 5 MG tablet Take 5 mg by mouth daily.     amoxicillin (AMOXIL) 500 MG capsule Take 500 mg by mouth 3 (three) times daily.     finasteride (PROSCAR) 5 MG tablet  Take 1 tablet (5 mg total) by mouth daily. 90 tablet 3   HYDROcodone-acetaminophen (NORCO/VICODIN) 5-325 MG tablet Take 1 tablet by mouth every 6 (six) hours as needed for moderate pain.     lisinopril (PRINIVIL,ZESTRIL) 10 MG tablet Take 10 mg by mouth daily.     lithium carbonate 150 MG capsule Take 3 capsules (450 mg total) by mouth at bedtime. 270 capsule 0   nortriptyline (PAMELOR) 50 MG capsule Take 1 capsule (50 mg total) by mouth at bedtime. 90 capsule 1   vitamin B-12 (CYANOCOBALAMIN) 50 MCG tablet Take 50 mcg by mouth daily.     No current facility-administered medications for this visit.    Medication Side Effects: None  Allergies: No Known Allergies  Past Medical History:  Diagnosis Date   Benign non-nodular prostatic hyperplasia with lower urinary tract symptoms 12/21/2014   Chronic sinusitis 08/23/2017   Combined forms of age-related cataract of both eyes 06/15/2016   Elevated prostate specific antigen (PSA) 07/03/2011   Epiretinal membrane, bilateral 06/03/2015   Epistaxis 08/16/2017   History of electroconvulsive therapy    Hypertension    Left ureteral stone 01/28/2015   Lithium-induced tremor    Major depressive disorder in remission    MCI (mild cognitive impairment) with memory loss 01/05/2021   Peripheral neuropathy    Recurrent nephrolithiasis 09/02/2017   Vitreous syneresis of both eyes 06/03/2015    Family History  Problem Relation Age of Onset   Lung cancer Mother    Heart disease Father    Pancreatic cancer Sister    Healthy Son    Healthy Daughter     Social History   Socioeconomic History   Marital status: Married    Spouse name: Not on file   Number of children: Not on file   Years of education: 26   Highest education level: Professional school degree (e.g., MD, DDS, DVM, JD)  Occupational History   Occupation: retired    Comment: urologist  Tobacco Use   Smoking status: Never   Smokeless tobacco: Never  Substance and Sexual Activity    Alcohol use: Yes    Comment: very rare   Drug use: No   Sexual activity: Yes  Other Topics Concern   Not on file  Social History Narrative   Right handed   Drinks caffeine    Two story home   Social Determinants of Health   Financial Resource Strain: Not on file  Food Insecurity: Not on file  Transportation Needs: Not on file  Physical Activity: Not on file  Stress: Not on file  Social Connections: Not on file  Intimate Partner Violence: Not on file    Past Medical History, Surgical history, Social history, and Family history were reviewed and updated as appropriate.   Please see review of systems for further details on the patient's review from today.   Objective:   Physical Exam:  There were  no vitals taken for this visit.  Physical Exam Constitutional:      General: He is not in acute distress.    Appearance: He is well-developed.  Musculoskeletal:        General: No deformity.  Neurological:     Mental Status: He is alert and oriented to person, place, and time.     Motor: No tremor.     Coordination: Coordination normal.     Gait: Gait normal.  Psychiatric:        Attention and Perception: Attention and perception normal.        Mood and Affect: Mood is not anxious or depressed. Affect is not labile, blunt, angry or inappropriate.        Speech: Speech normal.        Behavior: Behavior normal.        Thought Content: Thought content normal. Thought content does not include homicidal or suicidal ideation. Thought content does not include homicidal or suicidal plan.        Cognition and Memory: Cognition normal.        Judgment: Judgment normal.     Comments: Insight intact. No auditory or visual hallucinations. No delusions.  Recent depression appears to have resolved.    Lab Review:     Component Value Date/Time   NA 138 03/25/2019 1101   K 5.1 03/25/2019 1101   CL 101 03/25/2019 1101   CO2 20 03/25/2019 1101   GLUCOSE 99 03/25/2019 1101    GLUCOSE 81 04/18/2018 0937   BUN 20 03/25/2019 1101   CREATININE 1.14 03/25/2019 1101   CREATININE 1.18 (H) 04/18/2018 0937   CALCIUM 9.7 03/25/2019 1101   PROT 7.5 12/19/2016 2211   ALBUMIN 4.4 12/19/2016 2211   AST 29 12/19/2016 2211   ALT 42 12/19/2016 2211   ALKPHOS 54 12/19/2016 2211   BILITOT 0.7 12/19/2016 2211   GFRNONAA 59 (L) 03/25/2019 1101   GFRAA 68 03/25/2019 1101       Component Value Date/Time   WBC 12.5 (H) 12/19/2016 2211   RBC 5.19 12/19/2016 2211   HGB 15.6 12/19/2016 2211   HCT 46.2 12/19/2016 2211   PLT 265 12/19/2016 2211   MCV 89.0 12/19/2016 2211   MCH 30.1 12/19/2016 2211   MCHC 33.8 12/19/2016 2211   RDW 14.1 12/19/2016 2211   LYMPHSABS 1.3 12/19/2016 2211   MONOABS 0.9 12/19/2016 2211   EOSABS 0.1 12/19/2016 2211   BASOSABS 0.0 12/19/2016 2211    Lithium Lvl  Date Value Ref Range Status  03/25/2019 0.5 (L) 0.6 - 1.2 mmol/L Final    Comment:                                     Detection Limit = 0.1                           <0.1 indicates None Detected    Was on 300mg  daily .  No results found for: PHENYTOIN, PHENOBARB, VALPROATE, CBMZ   .res Assessment: Plan:    Recurrent major depression in full remission ([place])  Lithium-induced tremor  MCI (mild cognitive impairment) with memory loss  B12 deficiency  Lithium use   Fortunately Dr. Ruel Favors depression has remitted fairly quickly after the addition of the extra lithium from 300mg  daily to 450mg  daily in the fall 2019.  He clearly needs this dosage  of lithium and it should continue to be effective for him.  Discussed the Dr.Post articles on lithium.  He was having tremor at the last visit but it resolved with time and no treatment needed. Recent depression appears to have resolved.  His nortriptyline level was high in June and cut in half without complication thus far.  Disc MCI dx.  Discussed side effects of both these medications. Counseled patient regarding potential benefits,  risks, and side effects of lithium to include potential risk of lithium affecting thyroid and renal function.  Discussed need for periodic lab monitoring to determine drug level and to assess for potential adverse effects.  Counseled patient regarding signs and symptoms of lithium toxicity and advised that they notify office immediately or seek urgent medical attention if experiencing these signs and symptoms.  Patient advised to contact office with any questions or concerns. Get lithium level and nortriptyline level again ASAP.  Because of good response and a prior history of relapse no med changes should be pursued. Continue lithium 150 mg capsules 3 daily Continue nortriptyline 50 mg capsules 1 nightly  Discussed side effects of nortriptyline which could include anticholinergic side effects because of his age could include symptoms like confusion.  He has no such side effects at this time.  Contact us if you have the symptoms or any other symptoms such as sedation etc.  Disc B12 level and need for supplements.  He had stopped.  Continues gym and church Discussed relapse prevention techniques.  He had a recent brief bout of depression after downsizing to [place].  He thinks he is through the adjustment now  This is a 30-minute appointment  Follow-up Feb or sooner if there is any recurrence.  Lynder Parents MD, DFAPA  Please see After Visit Summary for patient specific instructions.  Future Appointments  Date Time Provider [place]  08/15/2021 10:30 AM Cottle, Billey Co., MD [facility] None  03/08/2022 11:15 AM Tat, Eustace Quail, DO [facility] None    No orders of the defined types were placed in this encounter.     -------------------------------

## 2021-05-02 DIAGNOSIS — Z20822 Contact with and (suspected) exposure to covid-19: Secondary | ICD-10-CM | POA: Diagnosis not present

## 2021-05-03 DIAGNOSIS — F3342 Major depressive disorder, recurrent, in full remission: Secondary | ICD-10-CM | POA: Diagnosis not present

## 2021-05-04 ENCOUNTER — Encounter: Payer: Self-pay | Admitting: Psychiatry

## 2021-05-09 ENCOUNTER — Other Ambulatory Visit: Payer: Self-pay | Admitting: Psychiatry

## 2021-05-09 DIAGNOSIS — F3342 Major depressive disorder, recurrent, in full remission: Secondary | ICD-10-CM

## 2021-05-16 DIAGNOSIS — Z20822 Contact with and (suspected) exposure to covid-19: Secondary | ICD-10-CM | POA: Diagnosis not present

## 2021-06-15 DIAGNOSIS — Z1159 Encounter for screening for other viral diseases: Secondary | ICD-10-CM | POA: Diagnosis not present

## 2021-06-15 DIAGNOSIS — Z20828 Contact with and (suspected) exposure to other viral communicable diseases: Secondary | ICD-10-CM | POA: Diagnosis not present

## 2021-06-27 DIAGNOSIS — Z20822 Contact with and (suspected) exposure to covid-19: Secondary | ICD-10-CM | POA: Diagnosis not present

## 2021-06-29 DIAGNOSIS — H35373 Puckering of macula, bilateral: Secondary | ICD-10-CM | POA: Diagnosis not present

## 2021-06-29 DIAGNOSIS — H25813 Combined forms of age-related cataract, bilateral: Secondary | ICD-10-CM | POA: Diagnosis not present

## 2021-07-20 DIAGNOSIS — Z20822 Contact with and (suspected) exposure to covid-19: Secondary | ICD-10-CM | POA: Diagnosis not present

## 2021-08-03 IMAGING — DX DG CHEST 2V
2 series · 2 of 2 positions shown · non-contrast
Comparison: none

CLINICAL DATA: Prolonged cough

EXAM:
CHEST - 2 VIEW

[dg chest 2 view (1 of 2)]
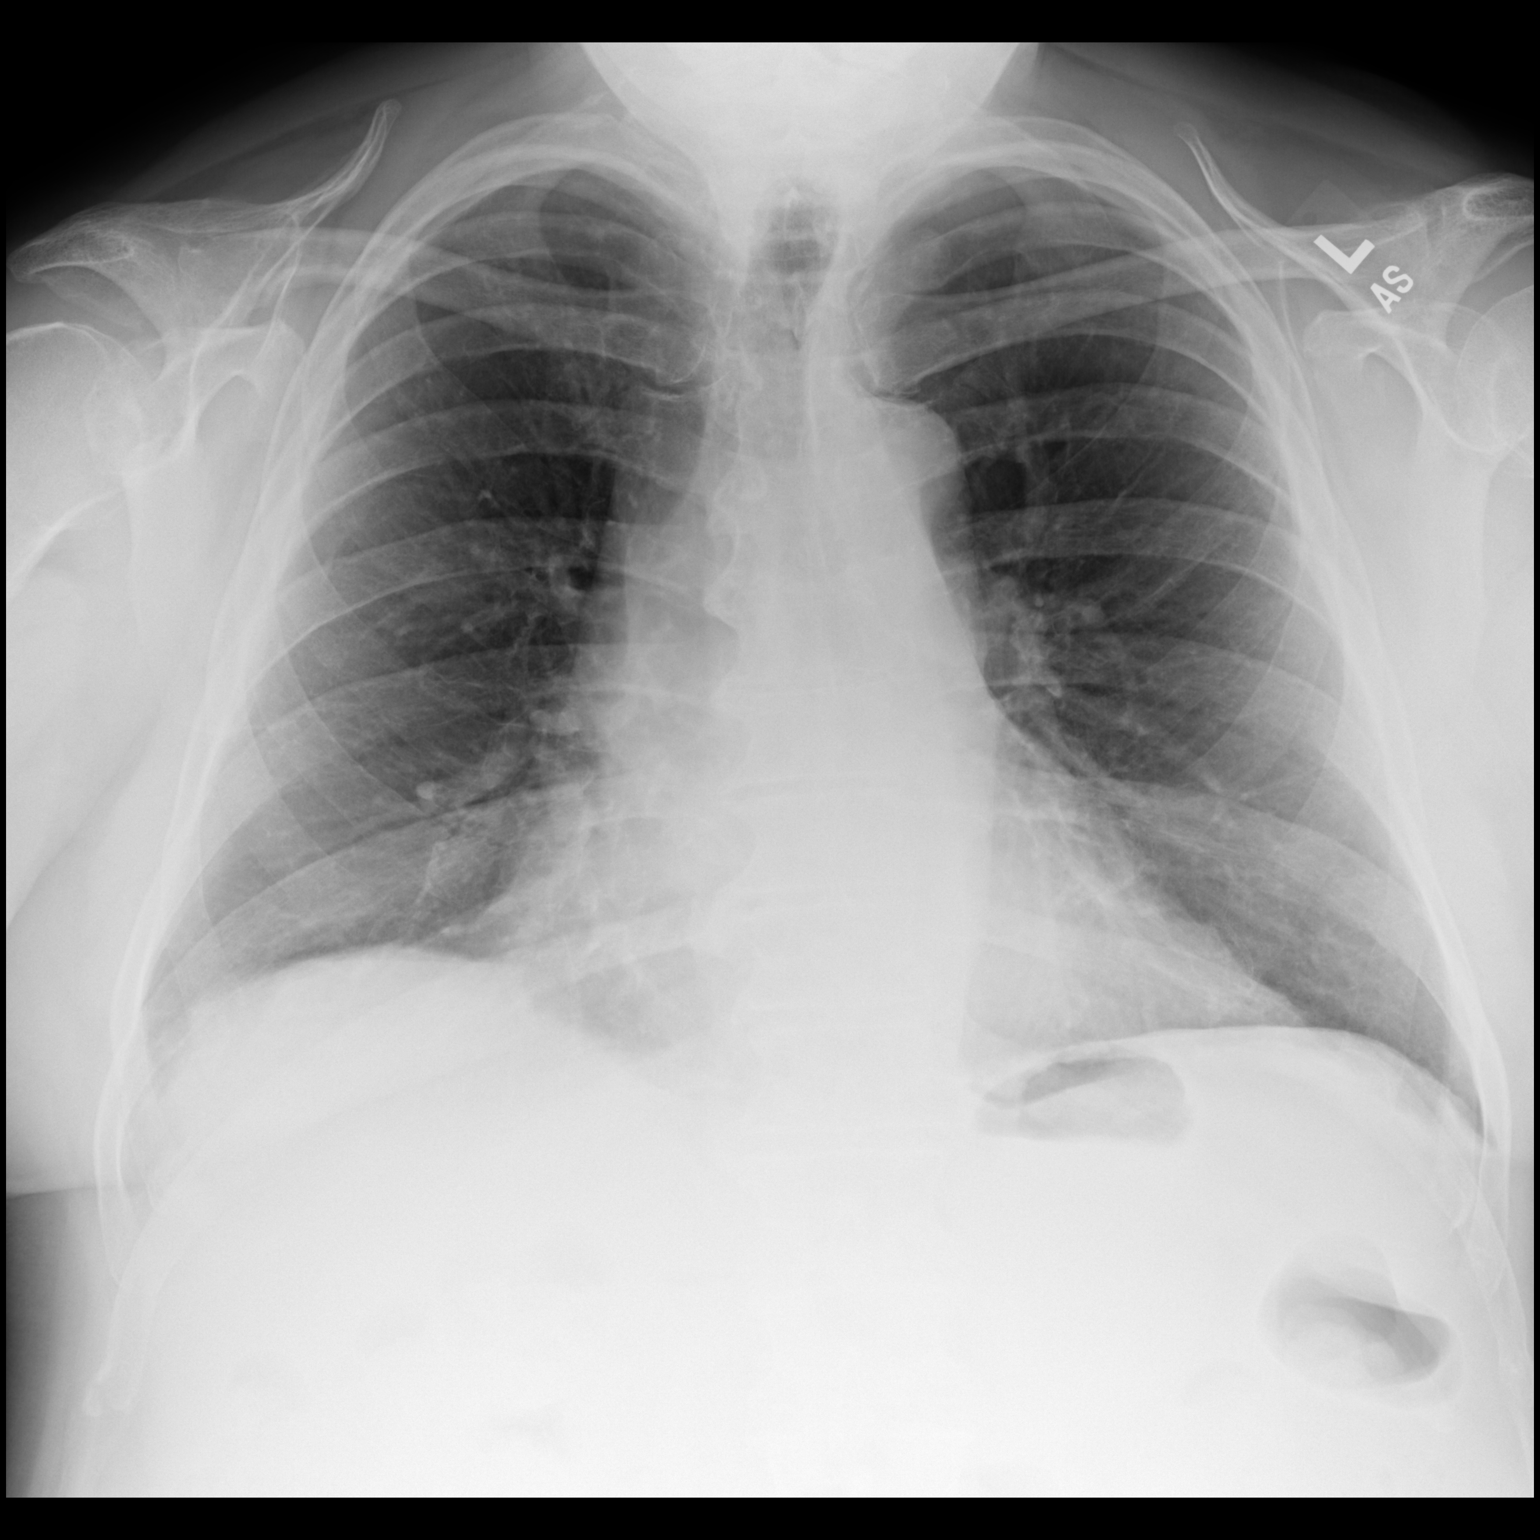

[dg chest 2 view (2 of 2)]
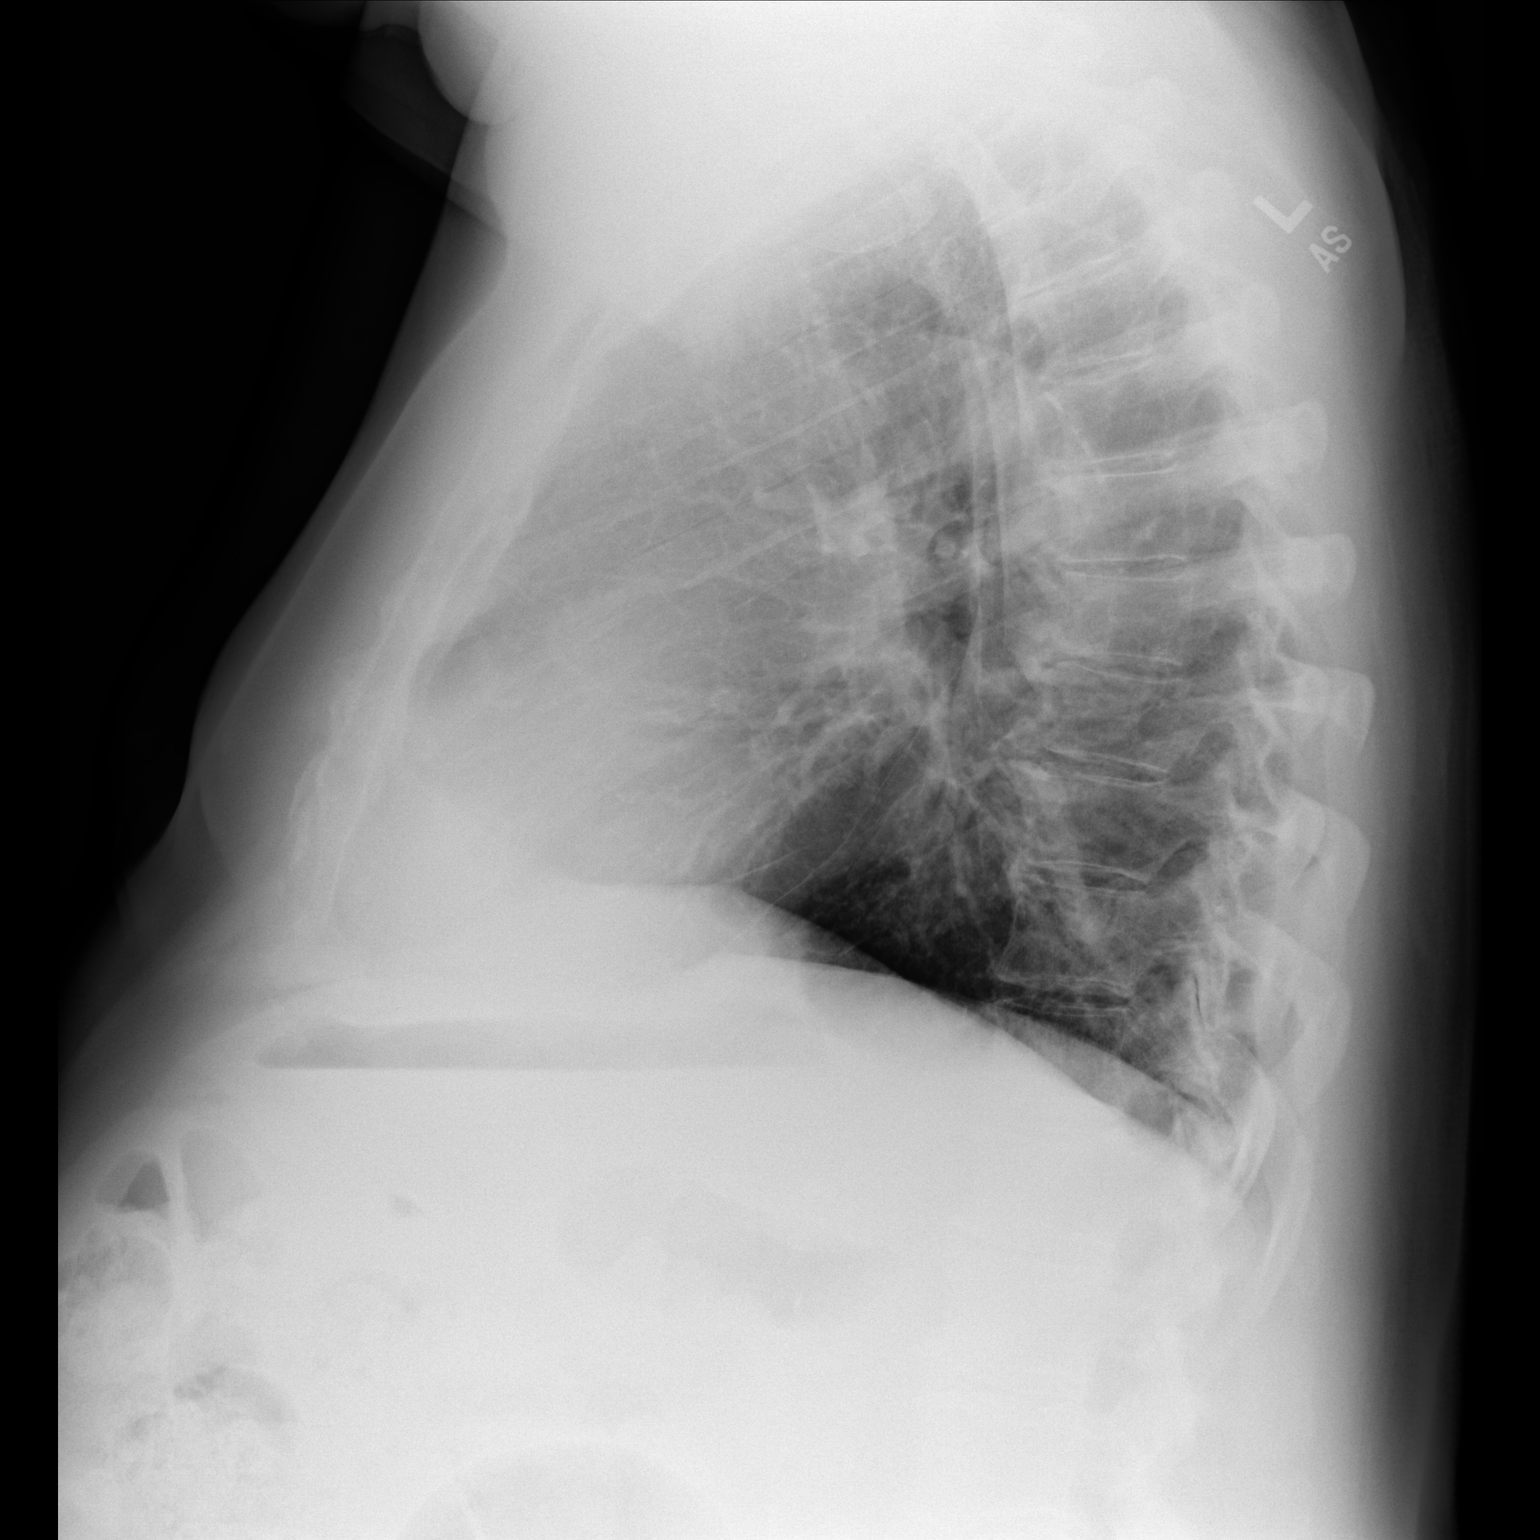

[2 of 2 positions shown; findings below may reference images not displayed]

FINDINGS: Lungs are clear.

Heart size and mediastinal contours are within normal limits.

No effusion.

Visualized bones unremarkable.
IMPRESSION: No acute cardiopulmonary disease.

## 2021-08-05 ENCOUNTER — Other Ambulatory Visit: Payer: Self-pay | Admitting: Psychiatry

## 2021-08-05 DIAGNOSIS — F3342 Major depressive disorder, recurrent, in full remission: Secondary | ICD-10-CM

## 2021-08-08 ENCOUNTER — Other Ambulatory Visit: Payer: Self-pay | Admitting: Psychiatry

## 2021-08-08 DIAGNOSIS — F3342 Major depressive disorder, recurrent, in full remission: Secondary | ICD-10-CM

## 2021-08-15 ENCOUNTER — Ambulatory Visit: Payer: Medicare Other | Admitting: Psychiatry

## 2021-08-23 DIAGNOSIS — J069 Acute upper respiratory infection, unspecified: Secondary | ICD-10-CM | POA: Diagnosis not present

## 2021-08-24 DIAGNOSIS — R0989 Other specified symptoms and signs involving the circulatory and respiratory systems: Secondary | ICD-10-CM | POA: Diagnosis not present

## 2021-08-24 DIAGNOSIS — R040 Epistaxis: Secondary | ICD-10-CM | POA: Diagnosis not present

## 2021-08-24 DIAGNOSIS — J343 Hypertrophy of nasal turbinates: Secondary | ICD-10-CM | POA: Diagnosis not present

## 2021-08-30 DIAGNOSIS — R059 Cough, unspecified: Secondary | ICD-10-CM | POA: Diagnosis not present

## 2021-08-30 DIAGNOSIS — I129 Hypertensive chronic kidney disease with stage 1 through stage 4 chronic kidney disease, or unspecified chronic kidney disease: Secondary | ICD-10-CM | POA: Diagnosis not present

## 2021-08-30 DIAGNOSIS — N1831 Chronic kidney disease, stage 3a: Secondary | ICD-10-CM | POA: Diagnosis not present

## 2021-09-12 ENCOUNTER — Telehealth: Payer: Self-pay | Admitting: Psychiatry

## 2021-09-12 DIAGNOSIS — N1831 Chronic kidney disease, stage 3a: Secondary | ICD-10-CM | POA: Diagnosis not present

## 2021-09-12 DIAGNOSIS — I129 Hypertensive chronic kidney disease with stage 1 through stage 4 chronic kidney disease, or unspecified chronic kidney disease: Secondary | ICD-10-CM | POA: Diagnosis not present

## 2021-09-12 NOTE — Telephone Encounter (Incomplete Revision)
Pt called 9:30. Needs sooner apt. Having issues. Apt 3/30. On canc list since 3/13. Pt contact # [phone]. Advise if CC wants to work in.   ?

## 2021-09-12 NOTE — Telephone Encounter (Addendum)
Pt called 9:30. Needs sooner apt. Having issues. Apt 3/30. On canc list since 3/13. Pt contact # [phone]. Advise if CC wants to work in.   ?

## 2021-09-12 NOTE — Telephone Encounter (Signed)
Patient wanting to see if he could get an earlier appt.  He said he was having anxiety. He did not provide more details. He said you were aware of his issues and he is just wanting an earlier appt. Has appt for 3/30 currently.  ?

## 2021-09-13 NOTE — Telephone Encounter (Signed)
noted 

## 2021-09-13 NOTE — Telephone Encounter (Signed)
Pt has apt 3/22 ?

## 2021-09-14 ENCOUNTER — Ambulatory Visit: Payer: Medicare Other | Admitting: Psychiatry

## 2021-09-15 ENCOUNTER — Encounter: Payer: Self-pay | Admitting: Psychiatry

## 2021-09-15 ENCOUNTER — Other Ambulatory Visit: Payer: Self-pay

## 2021-09-15 ENCOUNTER — Ambulatory Visit (INDEPENDENT_AMBULATORY_CARE_PROVIDER_SITE_OTHER): Payer: Medicare Other | Admitting: Psychiatry

## 2021-09-15 DIAGNOSIS — F3342 Major depressive disorder, recurrent, in full remission: Secondary | ICD-10-CM | POA: Diagnosis not present

## 2021-09-15 DIAGNOSIS — F5105 Insomnia due to other mental disorder: Secondary | ICD-10-CM

## 2021-09-15 DIAGNOSIS — E538 Deficiency of other specified B group vitamins: Secondary | ICD-10-CM | POA: Diagnosis not present

## 2021-09-15 MED ORDER — LORAZEPAM 0.5 MG PO TABS: 0.5000 mg | ORAL_TABLET | Freq: Every evening | ORAL | 1 refills | Status: DC

## 2021-09-15 NOTE — Progress Notes (Addendum)
Chase Mathis ?[id number] ?[birth year] ?88 y.o. ? ?Subjective:  ? ?Patient ID:  Chase Mathis is a 88 y.o. (DOB [birth year]) male. ? ?Chief Complaint:  ?Chief Complaint  ?Patient presents with  ? Follow-up  ? Depression  ? Anxiety  ? ? ?Depression ?       Associated symptoms include no decreased concentration and no suicidal ideas. ?Chase Mathis presents to the office today for follow-up of recurrence of depression that occurred at the end of 2019.  He responded to an increase in lithium from 300 to 450 mg daily..  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  He had an early relapse while on nortriptyline alone and lithium was added and he had booster ECTs and has been in remission since that time. ? ?seen in November 1985.  His depression had gone into remission again with increase in lithium from 300 to 450 mg daily.  No med changes were made at that time. ? ?02/2019 appt : ?Continue lithium 150 mg capsules 3 daily ?Continue nortriptyline 50 mg capsules 2 nightly ? ?11/30/20 lab and TC ;His nortriptyline level is 303 and should be 150 or lower.  He is on 2 of the 50 mg capsules.  Tell him to cut the dose back to one of the 50 mg capsules.  He has an appointment in August.  Let them know if he has any problem with the dosage reduction and to let us know. ? ?02/10/2021 appointment following noted: ?No problems with less nortriptyline to 50 mg nightly. ?Fine until 2-3 mos ago.  Was working in back yard and got weak and fell and couldn't get up.  Hasn't happened again. ?Saw neuro Chase Mathis. No PD and probably lithium tremor.  Equivical DaTscan. ?Had neuropsych testing and dx with [facility]. ?No further episodes of this.   ?I'm fine now.  Remained in remission.  Patient reports stable mood and denies depressed or irritable moods.  Patient denies any recent difficulty with anxiety.  Patient denies difficulty with sleep initiation but occ maintenance with work  dreams with anxiety.  Benadryl helps. Denies appetite disturbance.  Patient reports that energy and motivation have been good.  Patient denies any difficulty with concentration.  Patient denies any suicidal ideation. ?Minimal tremor. ? ?04/25/2021 appointment with the following noted: ?Did not get lithium level and labs as requested. ?Chase Mathis.  Didn't think it would be a problem.  Small place.  Younger than others.He doesn't like it bc of the change similar to what he had after moving to [place] years ago leading to a severe prolonged depression.  Feels he made a mistake but trying to ease up on his thinking bc he realizes it's not all about him. Dispersed his furniture.  Thinks he's adjusting better.   ?Once weekly goes to men's group of 50 mixed race.  Involved in other things.   ?Plan: Continue lithium 150 mg capsules 3 daily ?Continue nortriptyline 50 mg capsules 1 nightly ?Check lithium level ? ?09/12/2021 phone call wanting to be worked in sooner due to anxiety. ? ?09/15/2021 appointment with the following noted: seen with wife Chase Mathis ?Nortriptyline level ordered February 10, 2021 never completed ?Moved to [facility] several months ago and having trouble adjusting despite 6 years. ?Can't sleep and trouble with anxiety.  Trouble adjusting.  Can't shut his mind off.   ?Most stressful is per wife it's crowded and small  space.  She feels the same way about it.  She also feels it is a small space also.   She is struggling with some depression about it. Too. ?Living at [place].   ?He needs an office and a place to think and has an office. ?Usually to gym several times a week and walking with wife a couple of times per week.   ?Feels a need to make the place work.   ?Did this mainly to please the kids.   ?Consistent with nortriptyline 50 mg HS and lithium 450 but wife says she thinks he misses some of it. ?No SE. ?Mainly initial insomnia.  2-3  hours nightly.Naps an hour and she thinks  he might nap a little more. ?Active at church and in choir.  [place]. ?Gradually harder to do things.   ? ?Past Psychiatric Medication Trials:  ECT ?Paroxetine 60, duloxetine anxiety, sertraline 3 weeks, Lexapro 10 x 30 days, Effexor 225 ?Abilify 5 no response ?Quetiapine ?Geodon ?Adderall ?trazodone ?November 2018, we reduced the nortriptyline from 150 mg to 100 mg daily and reduced the lithium from 450 mg a day to 300 mg daily because of some tremor and because he wanted to try to reduce the medication.  His blood levels on the higher dosages were lithium 0.7 nortriptyline 103.   ?On the lower dosages the level in October were:  Li 0.4.  Nortriptyline 224. ? ?Review of Systems:  ?Review of Systems  ?Cardiovascular:  Negative for palpitations.  ?Neurological:  Positive for tremors. Negative for weakness.  ?Psychiatric/Behavioral:  Positive for sleep disturbance. Negative for agitation, behavioral problems, confusion, decreased concentration, dysphoric mood, hallucinations, self-injury and suicidal ideas. The patient is not nervous/anxious and is not hyperactive.   ? ?Medications: I have reviewed the patient's current medications. ? ?Current Outpatient Medications  ?Medication Sig Dispense Refill  ? amLODipine (NORVASC) 5 MG tablet Take 5 mg by mouth daily.    ? finasteride (PROSCAR) 5 MG tablet Take 1 tablet (5 mg total) by mouth daily. 90 tablet 3  ? lisinopril (PRINIVIL,ZESTRIL) 10 MG tablet Take 10 mg by mouth daily.    ? lithium carbonate 150 MG capsule TAKE 3 CAPSULES (450 MG TOTAL) BY MOUTH AT BEDTIME. 270 capsule 0  ? nortriptyline (PAMELOR) 50 MG capsule TAKE 1 CAPSULE BY MOUTH AT BEDTIME. 90 capsule 1  ? vitamin B-12 (CYANOCOBALAMIN) 50 MCG tablet Take 50 mcg by mouth daily.    ? amoxicillin (AMOXIL) 500 MG capsule Take 500 mg by mouth 3 (three) times daily. (Patient not taking: Reported on 09/15/2021)    ? HYDROcodone-acetaminophen (NORCO/VICODIN) 5-325 MG tablet Take 1 tablet by  mouth every 6 (six) hours as needed for moderate pain. (Patient not taking: Reported on 09/15/2021)    ? ?No current facility-administered medications for this visit.  ? ? ?Medication Side Effects: None ? ?Allergies: No Known Allergies ? ?Past Medical History:  ?Diagnosis Date  ? Benign non-nodular prostatic hyperplasia with lower urinary tract symptoms 12/21/2014  ? Chronic sinusitis 08/23/2017  ? Combined forms of age-related cataract of both eyes 06/15/2016  ? Elevated prostate specific antigen (PSA) 07/03/2011  ? Epiretinal membrane, bilateral 06/03/2015  ? Epistaxis 08/16/2017  ? History of electroconvulsive therapy   ? Hypertension   ? Left ureteral stone 01/28/2015  ? Lithium-induced tremor   ? Major depressive disorder in remission   ? MCI (mild cognitive impairment) with memory loss 01/05/2021  ? Peripheral neuropathy   ? Recurrent nephrolithiasis 09/02/2017  ?  Vitreous syneresis of both eyes 06/03/2015  ? ? ?Family History  ?Problem Relation Age of Onset  ? Lung cancer Mother   ? Heart disease Father   ? Pancreatic cancer Sister   ? Healthy Son   ? Healthy Daughter   ? ? ?Social History  ? ?Socioeconomic History  ? Marital status: Married  ?  Spouse name: Not on file  ? Number of children: Not on file  ? Years of education: 35  ? Highest education level: Professional school degree (e.g., MD, DDS, DVM, JD)  ?Occupational History  ? 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 Smoking status: Never  ? Smokeless tobacco: Never  ?Substance and Sexual Activity  ? Alcohol use: Yes  ?  Comment: very rare  ? Drug use: No  ? Sexual activity: Yes  ?Other Topics Concern  ? Not on file  ?Social History Narrative  ? Right handed  ? Drinks caffeine   ? Two story home  ? ?Social Determinants of Health  ? ?Financial Resource Strain: Not on file  ?Food Insecurity: Not on file  ?Transportation Needs: Not on file  ?Physical Activity: Not on file  ?Stress: Not on file  ?Social Connections: Not on file  ?Intimate  Partner Violence: Not on file  ? ? ?Past Medical History, Surgical history, Social history, and Family history were reviewed and updated as appropriate.  ? ?Please see review of systems for further

## 2021-09-16 DIAGNOSIS — F3342 Major depressive disorder, recurrent, in full remission: Secondary | ICD-10-CM | POA: Diagnosis not present

## 2021-09-16 DIAGNOSIS — E538 Deficiency of other specified B group vitamins: Secondary | ICD-10-CM | POA: Diagnosis not present

## 2021-09-19 ENCOUNTER — Encounter: Payer: Self-pay | Admitting: Psychiatry

## 2021-09-19 ENCOUNTER — Telehealth: Payer: Self-pay | Admitting: Psychiatry

## 2021-09-19 ENCOUNTER — Other Ambulatory Visit: Payer: Self-pay | Admitting: Psychiatry

## 2021-09-19 DIAGNOSIS — F331 Major depressive disorder, recurrent, moderate: Secondary | ICD-10-CM

## 2021-09-19 NOTE — Telephone Encounter (Signed)
Patient lvm @ 9:45 stating that he needs some help. Says he is having a hard time today and would like to talk to [place]. "Took medicine and it worked well but didn't completely take care of the problem, and needs some help." Pls rtc [phone] ?

## 2021-09-19 NOTE — Telephone Encounter (Signed)
Called patient and he said he is sleeping but can't get the "tension" to resolve. He mentioned still feeling depressed but said his main issues are "anxiety and tension."  ?

## 2021-09-19 NOTE — Telephone Encounter (Signed)
Patient notified of recommendations to increase lithium to 4 tablets. After a week of increased dose he should get a blood drawn as close to 12 hours after dose as possible. He said he is sleeping better with the lorazepam.  ?

## 2021-09-19 NOTE — Telephone Encounter (Signed)
Lithium level drawn at 130PM on 450HS but was still unexpectedly low at 0.3 which is below therapeutic range.  Have him increase lithium to 4 of the 150 mg capsules at night.  Repeat level next Monday and if it is normal then will give him larger 300 mg capsules. ? ?Is he sleeping better with lorazepam? ?

## 2021-09-20 ENCOUNTER — Ambulatory Visit: Payer: Medicare Other | Admitting: Psychiatry

## 2021-09-20 LAB — LITHIUM LEVEL: Lithium Lvl: 0.3 mmol/L — ABNORMAL LOW (ref 0.5–1.2)

## 2021-09-20 LAB — NORTRIPTYLINE (AVENTYL), SERUM: Nortriptyline (Aventyl), Serum: <20 ng/mL — ABNORMAL LOW (ref 50–150)

## 2021-09-20 LAB — VITAMIN B12: Vitamin B-12: >2000 pg/mL — ABNORMAL HIGH (ref 232–1245)

## 2021-09-22 ENCOUNTER — Ambulatory Visit: Payer: Medicare Other | Admitting: Psychiatry

## 2021-09-27 ENCOUNTER — Telehealth: Payer: Self-pay

## 2021-09-27 ENCOUNTER — Other Ambulatory Visit: Payer: Self-pay | Admitting: Psychiatry

## 2021-09-27 DIAGNOSIS — F331 Major depressive disorder, recurrent, moderate: Secondary | ICD-10-CM | POA: Diagnosis not present

## 2021-09-27 DIAGNOSIS — F3342 Major depressive disorder, recurrent, in full remission: Secondary | ICD-10-CM

## 2021-09-27 MED ORDER — LITHIUM CARBONATE 150 MG PO CAPS: 600.0000 mg | ORAL_CAPSULE | Freq: Every day | ORAL | 0 refills | Status: DC

## 2021-09-27 MED ORDER — NORTRIPTYLINE HCL 25 MG PO CAPS: 75.0000 mg | ORAL_CAPSULE | Freq: Every day | ORAL | 1 refills | Status: DC

## 2021-09-27 NOTE — Progress Notes (Signed)
Patient has relapsed with depression and anxiety and this is related to insufficient blood levels of his meds due to noncompliance: ?His nortriptyline level is less than 20 which means it is not detectable with a prescribed dose of 50 mg nightly.  In June 2020 his nortriptyline level was 303 on 100 mg daily and we reduced it to 50 mg daily.  Optimal levels are 50 to 150 ng/ml  This is highly suggestive that he is not taking nortriptyline on a regular basis.  He is missing a number of dosages.  At his last appointment his wife indicated she felt he was missing significant dosages.   ?His lithium level was also low and that was recently increased from 3 of the 150 mg capsules to 4 of the 150 mg capsules.  I think it was low because he was missing dosages of that as well. ? ?He needs to use a pillbox to improve compliance.  He will not get better if he does not take his medicine. ?Tell him to increase nortriptyline to 75 mg at night.  I will send in prescription for 3 of the 25 mg capsules. ?Continue lithium 600 mg nightly.  Repeat his blood levels after about a week. ?I sent in a orders to [facility]. ?He can use the lorazepam 0.5 mg 1 twice daily for anxiety and 1-2 at night for sleep.  But this will not solve his symptoms until he is more compliant with nortriptyline and lithium as indicated in the other note sent today.

## 2021-09-27 NOTE — Telephone Encounter (Signed)
Sent another note on this ?

## 2021-09-27 NOTE — Progress Notes (Signed)
He can use the lorazepam 0.5 mg 1 twice daily for anxiety and 1-2 at night for sleep.  But this will not solve his symptoms until he is more compliant with nortriptyline and lithium as indicated in the other note sent today. ?

## 2021-09-28 DIAGNOSIS — Z20822 Contact with and (suspected) exposure to covid-19: Secondary | ICD-10-CM | POA: Diagnosis not present

## 2021-09-28 LAB — LITHIUM LEVEL: Lithium Lvl: 0.7 mmol/L (ref 0.5–1.2)

## 2021-09-28 NOTE — Telephone Encounter (Signed)
Patient notified of recommendations. 

## 2021-09-29 DIAGNOSIS — Z20822 Contact with and (suspected) exposure to covid-19: Secondary | ICD-10-CM | POA: Diagnosis not present

## 2021-10-04 ENCOUNTER — Telehealth: Payer: Self-pay

## 2021-10-04 NOTE — Telephone Encounter (Signed)
Patient notified that he needs to get lithium and nortriptyline levels drawn after he has been on the increased doses for a solid week and should be as close to 12 hours after taking as possible. He stated understanding.  ?

## 2021-10-05 DIAGNOSIS — F3342 Major depressive disorder, recurrent, in full remission: Secondary | ICD-10-CM | POA: Diagnosis not present

## 2021-10-07 LAB — LITHIUM LEVEL: Lithium Lvl: 0.8 mmol/L (ref 0.5–1.2)

## 2021-10-07 LAB — NORTRIPTYLINE (AVENTYL), SERUM: Nortriptyline (Aventyl), Serum: 49 ng/mL — ABNORMAL LOW (ref 50–150)

## 2021-10-07 NOTE — Progress Notes (Signed)
Please make sure he is compliant with nortriptyline 3 of the 25 mg capsules..  His level is still low.  He admitted missing some doses at last visit and was encouraged to use pill box. ?He has had a recent relapse of depression and both his lithium levels and nortriptyline levels have been too low.  We already increased the lithium.  We having increase nortriptyline wants but need to do so again.  Have him increase nortriptyline to 100 mg nightly. ?

## 2021-10-10 DIAGNOSIS — Z20822 Contact with and (suspected) exposure to covid-19: Secondary | ICD-10-CM | POA: Diagnosis not present

## 2021-10-14 ENCOUNTER — Ambulatory Visit (INDEPENDENT_AMBULATORY_CARE_PROVIDER_SITE_OTHER): Payer: Medicare Other | Admitting: Psychiatry

## 2021-10-14 ENCOUNTER — Ambulatory Visit: Payer: Medicare Other | Admitting: Psychiatry

## 2021-10-14 ENCOUNTER — Encounter: Payer: Self-pay | Admitting: Psychiatry

## 2021-10-14 DIAGNOSIS — G251 Drug-induced tremor: Secondary | ICD-10-CM | POA: Diagnosis not present

## 2021-10-14 DIAGNOSIS — F3342 Major depressive disorder, recurrent, in full remission: Secondary | ICD-10-CM

## 2021-10-14 DIAGNOSIS — F5105 Insomnia due to other mental disorder: Secondary | ICD-10-CM | POA: Diagnosis not present

## 2021-10-14 DIAGNOSIS — Z79899 Other long term (current) drug therapy: Secondary | ICD-10-CM | POA: Diagnosis not present

## 2021-10-14 NOTE — Progress Notes (Signed)
[name] ?[id number] ?Feb 19, 1934 ?88 y.o. ? ?Subjective:  ? ?Patient ID:  Chase Mathis is a 88 y.o. (DOB 1934-05-23) male. ? ?Chief Complaint:  ?Chief Complaint  ?Patient presents with  ? Follow-up  ? Depression  ? Anxiety  ? ? ?Depression ?       Associated symptoms include fatigue.  Associated symptoms include no decreased concentration and no suicidal ideas. ?Chase Mathis presents to the office today for follow-up of recurrence of depression that occurred at the end of 2019.  He responded to an increase in lithium from 300 to 450 mg daily..  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  He had an early relapse while on nortriptyline alone and lithium was added and he had booster ECTs and has been in remission since that time. ? ?seen in November 2019.  His depression had gone into remission again with increase in lithium from 300 to 450 mg daily.  No med changes were made at that time.d ? ?02/2019 appt : ?Continue lithium 150 mg capsules 3 daily ?Continue nortriptyline 50 mg capsules 2 nightly ? ?11/30/20 lab and TC ;His nortriptyline level is 303 and should be 150 or lower.  He is on 2 of the 50 mg capsules.  Tell him to cut the dose back to one of the 50 mg capsules.  He has an appointment in August.  Let them know if he has any problem with the dosage reduction and to let us know. ? ?02/10/2021 appointment following noted: ?No problems with less nortriptyline to 50 mg nightly. ?Fine until 2-3 mos ago.  Was working in back yard and got weak and fell and couldn't get up.  Hasn't happened again. ?Saw neuro Dr. Carles Collet. No PD and probably lithium tremor.  Equivical DaTscan. ?Had neuropsych testing and dx with [facility]. ?No further episodes of this.   ?I'm fine now.  Remained in remission.  Patient reports stable mood and denies depressed or irritable moods.  Patient denies any recent difficulty with anxiety.  Patient denies difficulty with sleep  initiation but occ maintenance with work dreams with anxiety.  Benadryl helps. Denies appetite disturbance.  Patient reports that energy and motivation have been good.  Patient denies any difficulty with concentration.  Patient denies any suicidal ideation. ?Minimal tremor. ? ?04/25/2021 appointment with the following noted: ?Did not get lithium level and labs as requested. ?[place].  Didn't think it would be a problem.  Small place.  Younger than others.He doesn't like it bc of the change similar to what he had after moving to [place] years ago leading to a severe prolonged depression.  Feels he made a mistake but trying to ease up on his thinking bc he realizes it's not all about him. Dispersed his furniture.  Thinks he's adjusting better.   ?Once weekly goes to men's group of 50 mixed race.  Involved in other things.   ?Plan: Continue lithium 150 mg capsules 3 daily ?Continue nortriptyline 50 mg capsules 1 nightly ?Check lithium level ? ?09/12/2021 phone call wanting to be worked in sooner due to anxiety. ? ?09/15/2021 appointment with the following noted: seen with wife Chase Mathis ?Nortriptyline level ordered February 10, 2021 never completed ?Moved to [facility] several months ago and having trouble adjusting despite 6 years. ?Can't sleep and trouble with anxiety.  Trouble adjusting.  Can't shut his mind off.   ?Most stressful is per  wife it's crowded and small space.  She feels the same way about it.  She also feels it is a small space also.   She is struggling with some depression about it. Too. ?Living at [place].   ?He needs an office and a place to think and has an office. ?Usually to gym several times a week and walking with wife a couple of times per week.   ?Feels a need to make the place work.   ?Did this mainly to please the kids.   ?Consistent with nortriptyline 50 mg HS and lithium 450 but wife says she thinks he misses some of it. ?No SE. ?Mainly initial insomnia.  2-3   hours nightly.Naps an hour and she thinks he might nap a little more. ?Active at church and in choir.  [place]. ?Gradually harder to do things.   ?Plan: Continue lithium 150 mg capsules 3 daily ?Continue nortriptyline 50 mg capsules 1 nightly ?Check lithium and nortriptyline levels. ?Needs something for sleep.  Lorazepam 0.5-1.0  mg HS ? ?09/16/2021 lab note: ?Patient has relapsed with depression and anxiety and this is related to insufficient blood levels of his meds due to noncompliance: ?His nortriptyline level is less than 20 which means it is not detectable with a prescribed dose of 50 mg nightly.  In June 2020 his nortriptyline level was 303 on 100 mg daily and we reduced it to 50 mg daily.  Optimal levels are 50 to 150 ng/ml  This is highly suggestive that he is not taking nortriptyline on a regular basis.  He is missing a number of dosages.  At his last appointment his wife indicated she felt he was missing significant dosages.   ?His lithium level was also low and that was recently increased from 3 of the 150 mg capsules to 4 of the 150 mg capsules.  I think it was low because he was missing dosages of that as well. ? He needs to use a pillbox to improve compliance.  He will not get better if he does not take his medicine. ?Tell him to increase nortriptyline to 75 mg at night.  I will send in prescription for 3 of the 25 mg capsules. ?Continue lithium 600 mg nightly.  Repeat his blood levels after about a week. ?I sent in a orders to [facility]. ?He can use the lorazepam 0.5 mg 1 twice daily for anxiety and 1-2 at night for sleep.  But this will not solve his symptoms until he is more compliant with nortriptyline and lithium as indicated in the other note sent today. ? ?10/05/21 lab noted: On nortriptyline 75 mg nightly level was still low at 49.  Therefore nortriptyline increased to 100 mg nightly.  He needs to be very compliant with this and the lithium level which had also been  low. ? ?10/14/2021 appointment with the following noted:  with wife Chase Mathis ?I'm better.  Rx lorazepam helped improve sleep taking it BID ?Disc noncompliance which led to relapse. ?Wife and D in law are making sure he is taking the meds. But not using the pill box. ?Admits he was neglecting himself and doesn't know why that was necessary.   ?Feels much better.  Goes to gym and wants to do it again and didn't want to do it when depressed.   ?Sleep and less anxious.  More interest in things.  More enjoyment. ?Increased tremor and dropping some pills ?Appetite had gone down over a couple of mos  and lost 20# but appetite has returned. ?Wife notices he is having some mild inattentiveness and forgetfulness. ? ?Past Psychiatric Medication Trials:  ECT ?Paroxetine 60, duloxetine anxiety, sertraline 3 weeks, Lexapro 10 x 30 days, Effexor 225 ?Abilify 5 no response ?Quetiapine ?Geodon ?Adderall ?trazodone ?November 2018, we reduced the nortriptyline from 150 mg to 100 mg daily and reduced the lithium from 450 mg a day to 300 mg daily because of some tremor and because he wanted to try to reduce the medication.  His blood levels on the higher dosages were lithium 0.7 nortriptyline 103.   ?On the lower dosages the level in October were:  Li 0.4.  Nortriptyline 224. ? ?Review of Systems:  ?Review of Systems  ?Constitutional:  Positive for fatigue.  ?Cardiovascular:  Negative for palpitations.  ?Gastrointestinal:  Negative for diarrhea.  ?Neurological:  Positive for tremors. Negative for weakness.  ?Psychiatric/Behavioral:  Positive for depression and dysphoric mood. Negative for agitation, behavioral problems, confusion, decreased concentration, hallucinations, self-injury, sleep disturbance and suicidal ideas. The patient is not nervous/anxious and is not hyperactive.   ? ?Medications: I have reviewed the patient's current medications. ? ?Current Outpatient Medications  ?Medication Sig Dispense Refill  ? amLODipine (NORVASC) 5  MG tablet Take 5 mg by mouth daily.    ? amoxicillin (AMOXIL) 500 MG capsule Take 500 mg by mouth 3 (three) times daily.    ? finasteride (PROSCAR) 5 MG tablet Take 1 tablet (5 mg total) by mouth daily. 90 t

## 2021-10-19 DIAGNOSIS — Z20822 Contact with and (suspected) exposure to covid-19: Secondary | ICD-10-CM | POA: Diagnosis not present

## 2021-10-20 ENCOUNTER — Other Ambulatory Visit: Payer: Self-pay | Admitting: Psychiatry

## 2021-10-20 DIAGNOSIS — F3342 Major depressive disorder, recurrent, in full remission: Secondary | ICD-10-CM

## 2021-10-20 DIAGNOSIS — U071 COVID-19: Secondary | ICD-10-CM | POA: Diagnosis not present

## 2021-10-20 DIAGNOSIS — R059 Cough, unspecified: Secondary | ICD-10-CM | POA: Diagnosis not present

## 2021-10-20 DIAGNOSIS — Z79899 Other long term (current) drug therapy: Secondary | ICD-10-CM | POA: Diagnosis not present

## 2021-10-20 DIAGNOSIS — Z20822 Contact with and (suspected) exposure to covid-19: Secondary | ICD-10-CM | POA: Diagnosis not present

## 2021-10-20 DIAGNOSIS — R051 Acute cough: Secondary | ICD-10-CM | POA: Diagnosis not present

## 2021-10-21 DIAGNOSIS — Z20822 Contact with and (suspected) exposure to covid-19: Secondary | ICD-10-CM | POA: Diagnosis not present

## 2021-10-22 DIAGNOSIS — Z20822 Contact with and (suspected) exposure to covid-19: Secondary | ICD-10-CM | POA: Diagnosis not present

## 2021-10-22 LAB — LITHIUM LEVEL: Lithium Lvl: 0.6 mmol/L (ref 0.5–1.2)

## 2021-10-22 LAB — NORTRIPTYLINE (AVENTYL), SERUM: Nortriptyline (Aventyl), Serum: 91 ng/mL (ref 50–150)

## 2021-10-25 ENCOUNTER — Telehealth: Payer: Self-pay | Admitting: Neurology

## 2021-10-25 NOTE — Telephone Encounter (Signed)
error 

## 2021-10-26 ENCOUNTER — Ambulatory Visit: Payer: Medicare Other | Admitting: Psychiatry

## 2021-10-26 ENCOUNTER — Telehealth: Payer: Self-pay | Admitting: Psychiatry

## 2021-10-26 NOTE — Telephone Encounter (Signed)
Patient was c/o unsteadiness and dizziness that he thought was from the lorazepam. I asked how he was taking and he was taking QAM and QPM.  It was prescribed to take 1-2 at bedtime. Patient said he will try taking at bedtime and will let us know how he is doing with that dosing.  ?

## 2021-10-26 NOTE — Telephone Encounter (Signed)
Pt LVM @ 9:19a.  He wants a call back about reactions he is having to his current medications. ? ?Next appt 6/6 ?

## 2021-10-30 DIAGNOSIS — Z20822 Contact with and (suspected) exposure to covid-19: Secondary | ICD-10-CM | POA: Diagnosis not present

## 2021-10-31 DIAGNOSIS — Z20822 Contact with and (suspected) exposure to covid-19: Secondary | ICD-10-CM | POA: Diagnosis not present

## 2021-11-03 ENCOUNTER — Ambulatory Visit: Payer: Medicare Other | Admitting: Psychiatry

## 2021-11-04 ENCOUNTER — Other Ambulatory Visit: Payer: Self-pay | Admitting: Psychiatry

## 2021-11-04 DIAGNOSIS — F3342 Major depressive disorder, recurrent, in full remission: Secondary | ICD-10-CM

## 2021-11-22 NOTE — Progress Notes (Unsigned)
Assessment/Plan:   1.  Gait instabity, likely due to peripheral neuropathy  -on b12 supplement for def             -Patient is also on nortriptyline, which is an anticholinergic and certainly can contribute to gait instability, although he has not had a large dose.    -He has hypomimia.  Did not see this in the office, but he is DaTscan was equivocal.  Patient, wife, daughter.  He does not meet clinical criteria for Parkinson's disease.  I did offer them levodopa to see if it would help gait, but ultimately they decided to hold on that.  I agree.   2.  B12 deficiency             -Patient now on supplementation.   3.  Tremor             -This is likely due to the lithium.  Studies are varied, but incidate that up to 2/3 of patients on lithium will have lithium-induced tremor, even if the patients are not toxic on the medication.  This is just a common side effect of patients on lithium.  It doesn't particularly bother him and is very mild.  The benefits of the medication outweigh the SE of tremor.   4.  Memory change  -Neurocognitive testing done January 05, 1999 2010 was with evidence of MCI only.  Felt that this could be due to medication (nortriptyline, lithium).  He advised a repeat driving evaluation.  He is scheduled for that in February, 2023.  5.  F/u 1 year Subjective:   Chase Mathis was seen today in neurologic follow-up.  Records reviewed since last visit.  Continues to see Dr. Clovis Pu.  Patient moved to a senior living at [facility] since our last visit, which was a hard adjustment.  Depression got worse, because of noncompliance per Dr. Casimiro Needle records.  It was felt he was missing his lithium and his nortriptyline.  Nortriptyline was increased to 100 mg nightly and patient was prescribed lorazepam, 0.5 mg twice per day.  He began having trouble with unsteadiness and dizziness and he was told to take the lorazepam just at bedtime.  Patient currently on nortriptyline, 75 mg and  lorazepam 0.5 mg ***.  He returns today because of balance trouble.  Pt states that he shuffles if he doesn't think about it.  No falls.  Daughter states that   Last saw Dr. Clovis Pu February 10, 2021.  Records reviewed.   ALLERGIES:  No Known Allergies  CURRENT MEDICATIONS:  Outpatient Encounter Medications as of 11/23/2021  Medication Sig   amLODipine (NORVASC) 5 MG tablet Take 5 mg by mouth daily.   amoxicillin (AMOXIL) 500 MG capsule Take 500 mg by mouth 3 (three) times daily.   finasteride (PROSCAR) 5 MG tablet Take 1 tablet (5 mg total) by mouth daily.   HYDROcodone-acetaminophen (NORCO/VICODIN) 5-325 MG tablet Take 1 tablet by mouth every 6 (six) hours as needed for moderate pain.   lisinopril (PRINIVIL,ZESTRIL) 10 MG tablet Take 10 mg by mouth daily.   lithium carbonate 150 MG capsule TAKE 4 CAPSULES (600 MG TOTAL) BY MOUTH AT BEDTIME.   LORazepam (ATIVAN) 0.5 MG tablet Take 1-2 tablets (0.5-1 mg total) by mouth at bedtime.   nortriptyline (PAMELOR) 25 MG capsule Take 3 capsules (75 mg total) by mouth at bedtime.   vitamin B-12 (CYANOCOBALAMIN) 50 MCG tablet Take 50 mcg by mouth daily.   No facility-administered encounter medications on  file as of 11/23/2021.    Objective:   PHYSICAL EXAMINATION:    VITALS:   There were no vitals filed for this visit.    GEN:  Normal appears male in no acute distress.  Appears younger than stated age. HEENT:  Normocephalic, atraumatic. The mucous membranes are moist.   NEUROLOGICAL: Orientation: The patient is alert and oriented x3.  Cranial nerves: There is good facial symmetry.  Extraocular muscles are intact. The visual fields are full to confrontational testing. The speech is fluent and clear. Soft palate rises symmetrically and there is no tongue deviation. Hearing is intact to conversational tone. Sensation: Sensation is intact to light touch throughout. Motor: Strength is 5/5 in the bilateral upper and lower extremities.   Shoulder  shrug is equal and symmetric.  There is no pronator drift.    Movement examination: Tone: There is normal tone in the bilateral upper extremities.  The tone in the lower extremities is normal.  He does, however, have significant difficulty relaxing Abnormal movements: There is no rest tremor than is seen.  Rest tremor is felt with distraction.  He does, however, having intention and postural tremor.  It is very mild. Coordination:  There is no decremation with RAM's, with any form of RAMS, including alternating supination and pronation of the forearm, hand opening and closing, finger taps, heel taps and toe taps. Gait and Station: The patient has no difficulty arising out of a deep-seated chair without the use of the hands. The patient ambulates well in the hallway.  He does admit that he was trying to pick up his feet.    Chemistry      Component Value Date/Time   NA 138 03/25/2019 1101   K 5.1 03/25/2019 1101   CL 101 03/25/2019 1101   CO2 20 03/25/2019 1101   BUN 20 03/25/2019 1101   CREATININE 1.14 03/25/2019 1101   CREATININE 1.18 (H) 04/18/2018 0937      Component Value Date/Time   CALCIUM 9.7 03/25/2019 1101   ALKPHOS 54 12/19/2016 2211   AST 29 12/19/2016 2211   ALT 42 12/19/2016 2211   BILITOT 0.7 12/19/2016 2211     Lab Results  Component Value Date   VITAMINB12 >2000 (H) 09/16/2021      Total time spent on today's visit was *** minutes, including both face-to-face time and nonface-to-face time.  Time included that spent on review of records (prior notes available to me/labs/imaging if pertinent), discussing treatment and goals, answering patient's questions and coordinating care.   Cc:  Lajean Manes, MD

## 2021-11-23 ENCOUNTER — Encounter: Payer: Self-pay | Admitting: Neurology

## 2021-11-23 ENCOUNTER — Ambulatory Visit (INDEPENDENT_AMBULATORY_CARE_PROVIDER_SITE_OTHER): Payer: Medicare Other | Admitting: Neurology

## 2021-11-23 VITALS — BP 129/69 | HR 72 | Ht 68.0 in | Wt 216.0 lb

## 2021-11-23 DIAGNOSIS — G251 Drug-induced tremor: Secondary | ICD-10-CM | POA: Diagnosis not present

## 2021-11-23 DIAGNOSIS — G20 Parkinson's disease: Secondary | ICD-10-CM | POA: Diagnosis not present

## 2021-11-23 DIAGNOSIS — G20C Parkinsonism, unspecified: Secondary | ICD-10-CM

## 2021-11-27 ENCOUNTER — Other Ambulatory Visit: Payer: Self-pay | Admitting: Psychiatry

## 2021-11-27 DIAGNOSIS — F3342 Major depressive disorder, recurrent, in full remission: Secondary | ICD-10-CM

## 2021-11-28 NOTE — Telephone Encounter (Signed)
Appt tomorrow.

## 2021-11-29 ENCOUNTER — Encounter: Payer: Self-pay | Admitting: Psychiatry

## 2021-11-29 ENCOUNTER — Ambulatory Visit (INDEPENDENT_AMBULATORY_CARE_PROVIDER_SITE_OTHER): Payer: Medicare Other | Admitting: Psychiatry

## 2021-11-29 ENCOUNTER — Other Ambulatory Visit: Payer: Self-pay | Admitting: Psychiatry

## 2021-11-29 DIAGNOSIS — F3342 Major depressive disorder, recurrent, in full remission: Secondary | ICD-10-CM

## 2021-11-29 DIAGNOSIS — F5105 Insomnia due to other mental disorder: Secondary | ICD-10-CM | POA: Diagnosis not present

## 2021-11-29 DIAGNOSIS — G251 Drug-induced tremor: Secondary | ICD-10-CM

## 2021-11-29 DIAGNOSIS — E538 Deficiency of other specified B group vitamins: Secondary | ICD-10-CM | POA: Diagnosis not present

## 2021-11-29 DIAGNOSIS — G3184 Mild cognitive impairment, so stated: Secondary | ICD-10-CM

## 2021-11-29 DIAGNOSIS — F331 Major depressive disorder, recurrent, moderate: Secondary | ICD-10-CM | POA: Diagnosis not present

## 2021-11-29 DIAGNOSIS — Z79899 Other long term (current) drug therapy: Secondary | ICD-10-CM

## 2021-11-29 MED ORDER — LITHIUM CARBONATE 150 MG PO CAPS: 600.0000 mg | ORAL_CAPSULE | Freq: Every day | ORAL | 1 refills | Status: DC

## 2021-11-29 MED ORDER — LORAZEPAM 0.5 MG PO TABS: 0.5000 mg | ORAL_TABLET | Freq: Every evening | ORAL | 1 refills | Status: DC

## 2021-11-29 NOTE — Progress Notes (Signed)
Chase Mathis [id number] [birth year] 88 y.o.  Subjective:   Patient ID:  Chase Mathis is a 88 y.o. (DOB [birth year]) male.  Chief Complaint:  Chief Complaint  Patient presents with   Follow-up   Depression    Depression        Associated symptoms include fatigue.  Associated symptoms include no decreased concentration and no suicidal ideas. Chase Mathis presents to the office today for follow-up of recurrence of depression that occurred at the end of 2019.  He responded to an increase in lithium from 300 to 450 mg daily..  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  He had an early relapse while on nortriptyline alone and lithium was added and he had booster ECTs and has been in remission since that time.  seen in November 2019.  His depression had gone into remission again with increase in lithium from 300 to 450 mg daily.  No med changes were made at that time.d  02/2019 appt : Continue lithium 150 mg capsules 3 daily Continue nortriptyline 50 mg capsules 2 nightly  11/30/20 lab and TC ;His nortriptyline level is 303 and should be 150 or lower.  He is on 2 of the 50 mg capsules.  Tell him to cut the dose back to one of the 50 mg capsules.  He has an appointment in August.  Let them know if he has any problem with the dosage reduction and to let us know.  02/10/2021 appointment following noted: No problems with less nortriptyline to 50 mg nightly. Fine until 2-3 mos ago.  Was working in back yard and got weak and fell and couldn't get up.  Hasn't happened again. Saw neuro Dr. Carles Mathis. No PD and probably lithium tremor.  Equivical DaTscan. Had neuropsych testing and dx with MCI. No further episodes of this.   I'm fine now.  Remained in remission.  Patient reports stable mood and denies depressed or irritable moods.  Patient denies any recent difficulty with anxiety.  Patient denies difficulty with sleep initiation  but occ maintenance with work dreams with anxiety.  Benadryl helps. Denies appetite disturbance.  Patient reports that energy and motivation have been good.  Patient denies any difficulty with concentration.  Patient denies any suicidal ideation. Minimal tremor.  04/25/2021 appointment with the following noted: Did not get lithium level and labs as requested. Chase Mathis.  Didn't think it would be a problem.  Small place.  Younger than others.He doesn't like it bc of the change similar to what he had after moving to [place] years ago leading to a severe prolonged depression.  Feels he made a mistake but trying to ease up on his thinking bc he realizes it's not all about him. Dispersed his furniture.  Thinks he's adjusting better.   Once weekly goes to men's group of [employer] mixed race.  Involved in other things.   Plan: Continue lithium 150 mg capsules 3 daily Continue nortriptyline 50 mg capsules 1 nightly Check lithium level  09/12/2021 phone call wanting to be worked in sooner due to anxiety.  09/15/2021 appointment with the following noted: seen with wife Chase Mathis Nortriptyline level ordered February 10, 2021 never completed Moved to [facility] several months ago and having trouble adjusting despite 6 years. Can't sleep and trouble with anxiety.  Trouble adjusting.  Can't shut his mind off.   Most stressful is per wife it's crowded  and small space.  She feels the same way about it.  She also feels it is a small space also.   She is struggling with some depression about it. Too. Living at [place].   He needs an office and a place to think and has an office. Usually to gym several times a week and walking with wife a couple of times per week.   Feels a need to make the place work.   Did this mainly to please the kids.   Consistent with nortriptyline 50 mg HS and lithium 450 but wife says she thinks he misses some of it. No SE. Mainly initial insomnia.  2-3  hours  nightly.Naps an hour and she thinks he might nap a little more. Active at church and in choir.  [place]. Gradually harder to do things.   Plan: Continue lithium 150 mg capsules 3 daily Continue nortriptyline 50 mg capsules 1 nightly Check lithium and nortriptyline levels. Needs something for sleep.  Lorazepam 0.5-1.0  mg HS  09/16/2021 lab note: Patient has relapsed with depression and anxiety and this is related to insufficient blood levels of his meds due to noncompliance: His nortriptyline level is less than 20 which means it is not detectable with a prescribed dose of 50 mg nightly.  In June 2020 his nortriptyline level was 303 on 100 mg daily and we reduced it to 50 mg daily.  Optimal levels are 50 to 150 ng/ml  This is highly suggestive that he is not taking nortriptyline on a regular basis.  He is missing a number of dosages.  At his last appointment his wife indicated she felt he was missing significant dosages.   His lithium level was also low and that was recently increased from 3 of the 150 mg capsules to 4 of the 150 mg capsules.  I think it was low because he was missing dosages of that as well.  He needs to use a pillbox to improve compliance.  He will not get better if he does not take his medicine. Tell him to increase nortriptyline to 75 mg at night.  I will send in prescription for 3 of the 25 mg capsules. Continue lithium 600 mg nightly.  Repeat his blood levels after about a week. I sent in a orders to [facility]. He can use the lorazepam 0.5 mg 1 twice daily for anxiety and 1-2 at night for sleep.  But this will not solve his symptoms until he is more compliant with nortriptyline and lithium as indicated in the other note sent today.  10/05/21 lab noted: On nortriptyline 75 mg nightly level was still low at 49.  Therefore nortriptyline increased to 100 mg nightly.  He needs to be very compliant with this and the lithium level which had also been  low.  10/14/2021 appointment with the following noted:  with wife Chase Mathis I'm better.  Rx lorazepam helped improve sleep taking it BID Disc noncompliance which led to relapse. Wife and D in law are making sure he is taking the meds. But not using the pill box. Admits he was neglecting himself and doesn't know why that was necessary.   Feels much better.  Goes to gym and wants to do it again and didn't want to do it when depressed.   Sleep and less anxious.  More interest in things.  More enjoyment. Increased tremor and dropping some pills Appetite had gone down over a couple of mos and lost 20#  but appetite has returned. Wife notices he is having some mild inattentiveness and forgetfulness.  11/29/21 appt noted: Tolerated in crease meds. Ativan helped sleep and insomnia triggered the problems . Lihtium tremor is mild Sing in choir. More consistent with meds and no mistakes. Out of the depression and anxiety   Past Psychiatric Medication Trials:  ECT Paroxetine 60, duloxetine anxiety, sertraline 3 weeks, Lexapro 10 x 30 days, Effexor 225 Abilify 5 no response Quetiapine Geodon Adderall trazodone November 2018, we reduced the nortriptyline from 150 mg to 100 mg daily and reduced the lithium from 450 mg a day to 300 mg daily because of some tremor and because he wanted to try to reduce the medication.  His blood levels on the higher dosages were lithium 0.7 nortriptyline 103.   On the lower dosages the level in October were:  Li 0.4.  Nortriptyline 224.  Review of Systems:  Review of Systems  Constitutional:  Positive for fatigue.  Cardiovascular:  Negative for palpitations.  Gastrointestinal:  Negative for diarrhea.  Neurological:  Positive for tremors. Negative for weakness.  Psychiatric/Behavioral:  Positive for dysphoric mood. Negative for agitation, behavioral problems, confusion, decreased concentration, hallucinations, self-injury, sleep disturbance and suicidal ideas. The  patient is not nervous/anxious and is not hyperactive.    Medications: I have reviewed the patient's current medications.  Current Outpatient Medications  Medication Sig Dispense Refill   amLODipine (NORVASC) 5 MG tablet Take 5 mg by mouth daily.     finasteride (PROSCAR) 5 MG tablet Take 1 tablet (5 mg total) by mouth daily. 90 tablet 3   lisinopril (PRINIVIL,ZESTRIL) 10 MG tablet Take 10 mg by mouth daily.     lithium carbonate 150 MG capsule TAKE 4 CAPSULES (600 MG TOTAL) BY MOUTH AT BEDTIME. 120 capsule 0   LORazepam (ATIVAN) 0.5 MG tablet Take 1-2 tablets (0.5-1 mg total) by mouth at bedtime. 60 tablet 1   nortriptyline (PAMELOR) 25 MG capsule Take 3 capsules (75 mg total) by mouth at bedtime. 90 capsule 1   vitamin B-12 (CYANOCOBALAMIN) 50 MCG tablet Take 50 mcg by mouth daily.     HYDROcodone-acetaminophen (NORCO/VICODIN) 5-325 MG tablet Take 1 tablet by mouth every 6 (six) hours as needed for moderate pain. (Patient not taking: Reported on 11/29/2021)     No current facility-administered medications for this visit.    Medication Side Effects: None  Allergies: No Known Allergies  Past Medical History:  Diagnosis Date   Benign non-nodular prostatic hyperplasia with lower urinary tract symptoms 12/21/2014   Chronic sinusitis 08/23/2017   Combined forms of age-related cataract of both eyes 06/15/2016   Elevated prostate specific antigen (PSA) 07/03/2011   Epiretinal membrane, bilateral 06/03/2015   Epistaxis 08/16/2017   History of electroconvulsive therapy    Hypertension    Left ureteral stone 01/28/2015   Lithium-induced tremor    Major depressive disorder in remission    MCI (mild cognitive impairment) with memory loss 01/05/2021   Peripheral neuropathy    Recurrent nephrolithiasis 09/02/2017   Vitreous syneresis of both eyes 06/03/2015    Family History  Problem Relation Age of Onset   Lung cancer Mother    Heart disease Father    Pancreatic cancer Sister     Healthy Son    Healthy Daughter     Social History   Socioeconomic History   Marital status: Married    Spouse name: Not on file   Number of children: Not on file   Years of education: 36  Highest education level: Professional school degree (e.g., MD, DDS, DVM, JD)  Occupational History   Occupation: retired    Comment: urologist  Tobacco Use   Smoking status: Never   Smokeless tobacco: Never  Substance and Sexual Activity   Alcohol use: Yes    Comment: very rare   Drug use: No   Sexual activity: Yes  Other Topics Concern   Not on file  Social History Narrative   Right handed   Drinks caffeine    Two story home   Social Determinants of Health   Financial Resource Strain: Not on file  Food Insecurity: Not on file  Transportation Needs: Not on file  Physical Activity: Not on file  Stress: Not on file  Social Connections: Not on file  Intimate Partner Violence: Not on file    Past Medical History, Surgical history, Social history, and Family history were reviewed and updated as appropriate.   Please see review of systems for further details on the patient's review from today.   Objective:   Physical Exam:  There were no vitals taken for this visit.  Physical Exam Constitutional:      General: He is not in acute distress.    Appearance: He is well-developed.  Musculoskeletal:        General: No deformity.  Neurological:     Mental Status: He is alert and oriented to person, place, and time.     Motor: No tremor.     Coordination: Coordination normal.     Gait: Gait normal.  Psychiatric:        Attention and Perception: Attention and perception normal.        Mood and Affect: Mood is depressed. Mood is not anxious. Affect is not labile, blunt, angry or inappropriate.        Speech: Speech normal.        Behavior: Behavior normal.        Thought Content: Thought content normal. Thought content is not delusional. Thought content does not include homicidal  or suicidal ideation. Thought content does not include suicidal plan.        Cognition and Memory: Cognition normal.        Judgment: Judgment normal.     Comments: Insight intact. No auditory or visual hallucinations. No delusions.  Depression is not as bad as the last visit.    Lab Review:     Component Value Date/Time   NA 138 03/25/2019 1101   K 5.1 03/25/2019 1101   CL 101 03/25/2019 1101   CO2 20 03/25/2019 1101   GLUCOSE 99 03/25/2019 1101   GLUCOSE 81 04/18/2018 0937   BUN 20 03/25/2019 1101   CREATININE 1.14 03/25/2019 1101   CREATININE 1.18 (H) 04/18/2018 0937   CALCIUM 9.7 03/25/2019 1101   PROT 7.5 12/19/2016 2211   ALBUMIN 4.4 12/19/2016 2211   AST 29 12/19/2016 2211   ALT 42 12/19/2016 2211   ALKPHOS 54 12/19/2016 2211   BILITOT 0.7 12/19/2016 2211   GFRNONAA 59 (L) 03/25/2019 1101   GFRAA 68 03/25/2019 1101       Component Value Date/Time   WBC 12.5 (H) 12/19/2016 2211   RBC 5.19 12/19/2016 2211   HGB 15.6 12/19/2016 2211   HCT 46.2 12/19/2016 2211   PLT 265 12/19/2016 2211   MCV 89.0 12/19/2016 2211   MCH 30.1 12/19/2016 2211   MCHC 33.8 12/19/2016 2211   RDW 14.1 12/19/2016 2211   LYMPHSABS 1.3 12/19/2016 2211   MONOABS  0.9 12/19/2016 2211   EOSABS 0.1 12/19/2016 2211   BASOSABS 0.0 12/19/2016 2211    Lithium Lvl  Date Value Ref Range Status  10/20/2021 0.6 0.5 - 1.2 mmol/L Final    Comment:    A concentration of 0.5-0.8 mmol/L is advised for long-term use; concentrations of up to 1.2 mmol/L may be necessary during acute treatment.                                  Detection Limit = 0.1                           <0.1 indicates None Detected    11/30/20 lab and TC ;His nortriptyline level is 303 and should be 150 or lower.  He is on 2 of the 50 mg capsules.  05/03/2021 lithium level 0.8 on 450 mg nightly  No results found for: PHENYTOIN, PHENOBARB, VALPROATE, CBMZ   .res Assessment: Plan:    Recurrent major depression in full remission  ([place])  Lithium use  Insomnia due to mental condition  Lithium-induced tremor  Major depressive disorder, recurrent episode, moderate (HCC)  Low serum vitamin B12  MCI (mild cognitive impairment) with memory loss   Greater than 50% of 50 min face to face time with patient was spent on counseling and coordination of care. He is seen urgently following recent worsening of depression and anxiety. He started to see some improvement with more consistency with nortriptyline and lithium.  March 2023 relapse of depression.  It was discovered by blood levels as well as by history that there was some inconsistency with compliance with both nortriptyline and lithium.  Lithium dosage has been increased and nortriptyline level has been increased.  He is seeing some improvement in the depression and anxiety sleep and appetite and interest.  Much better with lithium and nortriptyline now that he's compliant again. We have to consistent lithium levels of 0.6 on 600 mg daily. Nortriptyline level on 75 mg nightly is 91 and in the middle of the normal range  He is having some side effects with the lithium including tremor and dropping things.  Discussed side effects of both these medications. Counseled patient regarding potential benefits, risks, and side effects of lithium to include potential risk of lithium affecting thyroid and renal function.  Discussed need for periodic lab monitoring to determine drug level and to assess for potential adverse effects.  Counseled patient regarding signs and symptoms of lithium toxicity and advised that they notify office immediately or seek urgent medical attention if experiencing these signs and symptoms.  Patient advised to contact office with any questions or concerns.   Continue lithium 150 mg capsules 4 daily Continue nortriptyline 75 mg capsules 1 nightly  Check lithium and nortriptyline levels again.  Order sent to [facility].  Discussed the risk of toxicity  due to recent unstable blood levels.  Needs something for sleep.  Lorazepam 0.5-1.0  mg HS helpful  Insomnia was also one of the symptoms he had with his relapse he feels more confident having the lorazepam to take as needed. Option propranolol  Discussed side effects of nortriptyline which could include anticholinergic side effects because of his age could include symptoms like confusion.  He has no such side effects at this time.  Contact us if you have the symptoms or any other symptoms such as sedation etc.  B12  level and need for supplements.  He had stopped. History low B12.  Was OK.  Consider change antidepressant.  Continues gym and church Discussed relapse prevention techniques.  He had a recent bout of depression after downsizing to [place].   Follow-up 3-4 mos .  Lynder Parents MD, DFAPA  Please see After Visit Summary for patient specific instructions.  Future Appointments  Date Time Provider [place]  03/08/2022 11:15 AM Tat, Eustace Quail, DO [facility] None    No orders of the defined types were placed in this encounter.     -------------------------------

## 2021-12-09 ENCOUNTER — Telehealth: Payer: Self-pay | Admitting: Neurology

## 2021-12-09 NOTE — Telephone Encounter (Signed)
Called patient was not able to reach him will try back later

## 2021-12-09 NOTE — Telephone Encounter (Signed)
Please see if patient can come 7/28 for skin biopsy

## 2022-01-20 ENCOUNTER — Ambulatory Visit (INDEPENDENT_AMBULATORY_CARE_PROVIDER_SITE_OTHER): Payer: Medicare Other | Admitting: Neurology

## 2022-01-20 DIAGNOSIS — R258 Other abnormal involuntary movements: Secondary | ICD-10-CM | POA: Diagnosis not present

## 2022-01-20 DIAGNOSIS — R251 Tremor, unspecified: Secondary | ICD-10-CM | POA: Diagnosis not present

## 2022-01-20 DIAGNOSIS — G20 Parkinson's disease: Secondary | ICD-10-CM | POA: Diagnosis not present

## 2022-01-20 MED ORDER — LIDOCAINE-EPINEPHRINE 1 %-1:100000 IJ SOLN
5.0000 mL | Freq: Once | INTRAMUSCULAR | Status: AC
  Administered 2022-01-20: 3.5 mL

## 2022-01-20 NOTE — Procedures (Signed)
Punch Biopsy Procedure Note  Preprocedure Diagnosis: Bradykinesia; Tremor;   Postprocedure Diagnosis: same  Locations: Site 1: left, posterior shoulder;  Site 2: above, left knee;  Site 3: above, left foot  Indications: r/o alpha synucleinopathy  Anesthesia: 3 mL Lidocaine 1% with epinephrine without added sodium bicarbonate  Procedure Details Patient informed of the risks (including but not limited to bleeding, pain, infection, scar and infection) and benefits of the procedure.  Informed consent obtained.  The areas which were chosen for biopsy, as above, and surrounding areas were given a sterile prep using betadyne and draped in the usual sterile fashion. The skin was then stretched perpendicular to the skin tension lines and sample removed using the 3 mm punch. Pressure applied, hemostasis achieved.   Dressing applied. The specimen(s) was sent for pathologic examination. The patient tolerated the procedure well.  Estimated Blood Loss: 0 ml  Condition: Stable  Complications: none.  Plan: 1. Instructed to keep the wound dry and covered for 24-48h and clean thereafter. 2. Warning signs of infection were reviewed.

## 2022-01-20 NOTE — Addendum Note (Signed)
Addended by: Armen Pickup A on: 01/20/2022 03:39 PM   Modules accepted: Orders

## 2022-02-14 DIAGNOSIS — E7800 Pure hypercholesterolemia, unspecified: Secondary | ICD-10-CM | POA: Diagnosis present

## 2022-02-14 DIAGNOSIS — N1831 Chronic kidney disease, stage 3a: Secondary | ICD-10-CM | POA: Diagnosis present

## 2022-02-14 DIAGNOSIS — H25813 Combined forms of age-related cataract, bilateral: Secondary | ICD-10-CM | POA: Diagnosis not present

## 2022-02-14 DIAGNOSIS — Z01818 Encounter for other preprocedural examination: Secondary | ICD-10-CM | POA: Diagnosis not present

## 2022-02-15 ENCOUNTER — Telehealth: Payer: Self-pay

## 2022-02-15 ENCOUNTER — Telehealth: Payer: Self-pay | Admitting: Neurology

## 2022-02-15 NOTE — Telephone Encounter (Signed)
Received results of skin biopsy.  There was no evidence of alpha-synuclein.  There was evidence of small fiber neuropathy.  There was no evidence of amyloid neuropathy.  Let patient know that there was no evidence of alpha-synuclein (hallmark of Parkinsons) within the skin biopsy.  Please schedule a follow-up if he does not already have 1 within the next few months.

## 2022-02-15 NOTE — Telephone Encounter (Signed)
Called and no answer and no vm  

## 2022-02-15 NOTE — Telephone Encounter (Signed)
Called patient and unable to leave a message as phone kept on ringing.

## 2022-02-16 ENCOUNTER — Telehealth: Payer: Self-pay

## 2022-02-16 NOTE — Telephone Encounter (Signed)
Called Unable to leave message has no vm

## 2022-02-16 NOTE — Telephone Encounter (Signed)
Called patient on both home and cell phone numbers and were unable to leave a message. Home phone rang and rang and unable to leave message and cell phone had a full voicemail.

## 2022-02-22 ENCOUNTER — Telehealth: Payer: Self-pay

## 2022-02-22 NOTE — Telephone Encounter (Signed)
Unable to leave vm on either phone. Call two times

## 2022-02-22 NOTE — Telephone Encounter (Signed)
Called patient on both phone numbers on answer or vm on either one.

## 2022-02-23 DIAGNOSIS — H25811 Combined forms of age-related cataract, right eye: Secondary | ICD-10-CM | POA: Diagnosis not present

## 2022-02-23 DIAGNOSIS — H52201 Unspecified astigmatism, right eye: Secondary | ICD-10-CM | POA: Diagnosis not present

## 2022-02-24 DIAGNOSIS — Z48810 Encounter for surgical aftercare following surgery on the sense organs: Secondary | ICD-10-CM | POA: Diagnosis not present

## 2022-02-24 DIAGNOSIS — H25812 Combined forms of age-related cataract, left eye: Secondary | ICD-10-CM | POA: Diagnosis not present

## 2022-02-24 DIAGNOSIS — H35373 Puckering of macula, bilateral: Secondary | ICD-10-CM | POA: Diagnosis not present

## 2022-02-25 ENCOUNTER — Other Ambulatory Visit: Payer: Self-pay | Admitting: Psychiatry

## 2022-02-25 DIAGNOSIS — F3342 Major depressive disorder, recurrent, in full remission: Secondary | ICD-10-CM

## 2022-03-06 DIAGNOSIS — H35373 Puckering of macula, bilateral: Secondary | ICD-10-CM | POA: Diagnosis not present

## 2022-03-06 DIAGNOSIS — Z48810 Encounter for surgical aftercare following surgery on the sense organs: Secondary | ICD-10-CM | POA: Diagnosis not present

## 2022-03-06 DIAGNOSIS — H25812 Combined forms of age-related cataract, left eye: Secondary | ICD-10-CM | POA: Diagnosis not present

## 2022-03-06 DIAGNOSIS — Z79899 Other long term (current) drug therapy: Secondary | ICD-10-CM | POA: Diagnosis not present

## 2022-03-08 ENCOUNTER — Ambulatory Visit: Payer: Medicare Other | Admitting: Neurology

## 2022-03-10 ENCOUNTER — Other Ambulatory Visit: Payer: Self-pay | Admitting: Psychiatry

## 2022-03-10 DIAGNOSIS — F5105 Insomnia due to other mental disorder: Secondary | ICD-10-CM

## 2022-03-10 NOTE — Progress Notes (Signed)
he Results of your Lung Cancer Screening Exams will be called to you by the Lung Screening Staff. If you do not hear from them within the next 2 days, please contact the office at  [phone].   Thank you for your participation in this program.   Adonis Huguenin, RN

## 2022-03-16 DIAGNOSIS — L218 Other seborrheic dermatitis: Secondary | ICD-10-CM | POA: Diagnosis not present

## 2022-03-16 DIAGNOSIS — H35373 Puckering of macula, bilateral: Secondary | ICD-10-CM | POA: Diagnosis not present

## 2022-03-16 DIAGNOSIS — Z85828 Personal history of other malignant neoplasm of skin: Secondary | ICD-10-CM | POA: Diagnosis not present

## 2022-03-16 DIAGNOSIS — D692 Other nonthrombocytopenic purpura: Secondary | ICD-10-CM | POA: Diagnosis not present

## 2022-03-16 DIAGNOSIS — H25812 Combined forms of age-related cataract, left eye: Secondary | ICD-10-CM | POA: Diagnosis not present

## 2022-03-27 ENCOUNTER — Ambulatory Visit: Payer: Medicare Other | Admitting: Psychiatry

## 2022-04-18 ENCOUNTER — Other Ambulatory Visit: Payer: Self-pay | Admitting: Psychiatry

## 2022-04-18 DIAGNOSIS — F3342 Major depressive disorder, recurrent, in full remission: Secondary | ICD-10-CM

## 2022-04-20 DIAGNOSIS — H52202 Unspecified astigmatism, left eye: Secondary | ICD-10-CM | POA: Diagnosis not present

## 2022-04-20 DIAGNOSIS — H25812 Combined forms of age-related cataract, left eye: Secondary | ICD-10-CM | POA: Diagnosis not present

## 2022-04-20 DIAGNOSIS — I10 Essential (primary) hypertension: Secondary | ICD-10-CM | POA: Diagnosis not present

## 2022-04-24 DIAGNOSIS — Z23 Encounter for immunization: Secondary | ICD-10-CM | POA: Diagnosis not present

## 2022-05-01 NOTE — Progress Notes (Unsigned)
Assessment/Plan:   1.  Gait instabity  -Peripheral neuropathy likely contributes.  He is on a B12 supplement.             -Patient is also on nortriptyline, which is an anticholinergic and certainly can contribute to gait instability, although he is not on a large dose and did worse with his depression when he was off of it.  -Skin biopsy negative for alpha-synuclein in August, 2023  -skin bx was pos for PN which could produce gait instability  -DaTscan did demonstrate decreased uptake in the left caudate more than the right and mild decreased activity in the left posterior stratum.  -Patient's case is very complex, partially because the presence of tremor inducing agents, and because of the fact that his skin biopsy has been negative and DaTscan has been marginally positive.  We have decided to trial him on carbidopa/levodopa 25/100 3 times daily.  Titration schedule was given.  -refer to PT for gait   2.  B12 deficiency             -Patient now on supplementation.   3.  Tremor             -This is likely due to the lithium, and we discussed that again today.  He has both intention tremor (likely from lithium), but he has intermittent rest tremor as well (I have seen this on both sides independently)..  Studies are varied, but incidate that up to 2/3 of patients on lithium will have lithium-induced tremor, even if the patients are not toxic on the medication.  This is just a common side effect of patients on lithium.  It doesn't particularly bother him and is very mild.  The benefits of the medication outweigh the SE of tremor.   4.  Memory change  -Neurocognitive testing done January 04, 2021 with evidence of MCI only.  Felt that this could be due to medication (nortriptyline, lithium).    Subjective:   Chase Mathis was seen today in neurologic follow-up.  Records reviewed since last visit.  Pt with wife who supplements hx.   I last saw the patient at the end of May pt did not want to  trial levodopa.  He was hoping his psych meds would be reduced first.   He states today that he hasn't seen Dr. Clovis Pu yet.   We did a skin biopsy in August which was negative for alpha-synuclein.  There was evidence of small fiber neuropathy and no evidence of amyloid neuropathy.    ALLERGIES:  No Known Allergies  CURRENT MEDICATIONS:  Outpatient Encounter Medications as of 05/02/2022  Medication Sig   amLODipine (NORVASC) 5 MG tablet Take 5 mg by mouth daily.   finasteride (PROSCAR) 5 MG tablet Take 1 tablet (5 mg total) by mouth daily.   lisinopril (PRINIVIL,ZESTRIL) 10 MG tablet Take 10 mg by mouth daily.   lithium carbonate 150 MG capsule TAKE 4 CAPSULES BY MOUTH AT BEDTIME.   lithium carbonate 150 MG capsule TAKE 4 CAPSULES (600 MG TOTAL) BY MOUTH AT BEDTIME.   LORazepam (ATIVAN) 0.5 MG tablet TAKE 1-2 TABLETS (0.5-1 MG TOTAL) BY MOUTH AT BEDTIME.   nortriptyline (PAMELOR) 25 MG capsule TAKE 3 CAPSULES (75 MG TOTAL) BY MOUTH AT BEDTIME.   vitamin B-12 (CYANOCOBALAMIN) 50 MCG tablet Take 50 mcg by mouth daily.   [DISCONTINUED] HYDROcodone-acetaminophen (NORCO/VICODIN) 5-325 MG tablet Take 1 tablet by mouth every 6 (six) hours as needed for moderate pain. (Patient not  taking: Reported on 11/29/2021)   No facility-administered encounter medications on file as of 05/02/2022.    Objective:   PHYSICAL EXAMINATION:    VITALS:   Vitals:   05/02/22 1037  BP: 122/76  Pulse: 61  SpO2: 96%  Weight: 216 lb 3.2 oz (98.1 kg)       GEN:  Normal appears male in no acute distress.  Appears younger than stated age. HEENT:  Normocephalic, atraumatic. The mucous membranes are moist.  Cardiovascular: Regular rate rhythm Lungs: Clear to auscultation bilaterally  NEUROLOGICAL: Orientation: The patient is alert and oriented x3.  Cranial nerves: There is good facial symmetry.  Extraocular muscles are intact. The visual fields are full to confrontational testing. The speech is fluent and clear.  Soft palate rises symmetrically and there is no tongue deviation. Hearing is intact to conversational tone. Sensation: Sensation is intact to light touch throughout. Motor: Strength is 5/5 in the bilateral upper and lower extremities.   Shoulder shrug is equal and symmetric.  There is no pronator drift.    Movement examination: Tone: There is slight increased tone in the right upper extremity. Abnormal movements: There is no rest tremor today.  There is intention tremor.  There is postural tremor bilaterally. Coordination:  There is mild decremation on the right. Gait and Station: The patient arises out of the chair without the use of his hands, but he nearly falls back (uses examiner's hand to steady himself).  He is slightly short stepped and drags the heels when he ambulates, but does not shuffle the rest of the foot.   Chemistry      Component Value Date/Time   NA 138 03/25/2019 1101   K 5.1 03/25/2019 1101   CL 101 03/25/2019 1101   CO2 20 03/25/2019 1101   BUN 20 03/25/2019 1101   CREATININE 1.14 03/25/2019 1101   CREATININE 1.18 (H) 04/18/2018 0937      Component Value Date/Time   CALCIUM 9.7 03/25/2019 1101   ALKPHOS 54 12/19/2016 2211   AST 29 12/19/2016 2211   ALT 42 12/19/2016 2211   BILITOT 0.7 12/19/2016 2211     Lab Results  Component Value Date   VITAMINB12 >2000 (H) 09/16/2021      Total time spent on today's visit was 32 minutes, including both face-to-face time and nonface-to-face time.  Time included that spent on review of records (prior notes available to me/labs/imaging if pertinent), discussing treatment and goals, answering patient's questions and coordinating care.   Cc:  Lajean Manes, MD

## 2022-05-02 ENCOUNTER — Ambulatory Visit (INDEPENDENT_AMBULATORY_CARE_PROVIDER_SITE_OTHER): Payer: Medicare Other | Admitting: Neurology

## 2022-05-02 VITALS — BP 122/76 | HR 61 | Wt 216.2 lb

## 2022-05-02 DIAGNOSIS — G20C Parkinsonism, unspecified: Secondary | ICD-10-CM

## 2022-05-02 MED ORDER — CARBIDOPA-LEVODOPA 25-100 MG PO TABS: 1.0000 | ORAL_TABLET | Freq: Three times a day (TID) | ORAL | 1 refills | Status: DC

## 2022-05-02 NOTE — Patient Instructions (Signed)
Start Carbidopa Levodopa as follows: Take 1/2 tablet three times daily, at least 30 minutes before meals (approximately 6am/11am/4pm), for one week Then take 1/2 tablet in the morning, 1/2 tablet in the afternoon, 1 tablet in the evening, at least 30 minutes before meals, for one week Then take 1/2 tablet in the morning, 1 tablet in the afternoon, 1 tablet in the evening, at least 30 minutes before meals, for one week Then take 1 tablet three times daily at 6am/11am/4pm, at least 30 minutes before meals   As a reminder, carbidopa/levodopa can be taken at the same time as a carbohydrate, but we like to have you take your pill either 30 minutes before a protein source or 1 hour after as protein can interfere with carbidopa/levodopa absorption.

## 2022-06-01 ENCOUNTER — Other Ambulatory Visit: Payer: Self-pay

## 2022-06-01 DIAGNOSIS — G20C Parkinsonism, unspecified: Secondary | ICD-10-CM

## 2022-06-06 ENCOUNTER — Other Ambulatory Visit: Payer: Self-pay | Admitting: Psychiatry

## 2022-06-06 DIAGNOSIS — F5105 Insomnia due to other mental disorder: Secondary | ICD-10-CM

## 2022-06-12 DIAGNOSIS — G20C Parkinsonism, unspecified: Secondary | ICD-10-CM | POA: Diagnosis not present

## 2022-06-12 DIAGNOSIS — R2689 Other abnormalities of gait and mobility: Secondary | ICD-10-CM | POA: Diagnosis not present

## 2022-06-12 DIAGNOSIS — R2681 Unsteadiness on feet: Secondary | ICD-10-CM | POA: Diagnosis not present

## 2022-06-14 ENCOUNTER — Ambulatory Visit (INDEPENDENT_AMBULATORY_CARE_PROVIDER_SITE_OTHER): Payer: Medicare Other | Admitting: Psychiatry

## 2022-06-14 ENCOUNTER — Encounter: Payer: Self-pay | Admitting: Psychiatry

## 2022-06-14 DIAGNOSIS — F3342 Major depressive disorder, recurrent, in full remission: Secondary | ICD-10-CM | POA: Diagnosis not present

## 2022-06-14 DIAGNOSIS — G251 Drug-induced tremor: Secondary | ICD-10-CM

## 2022-06-14 DIAGNOSIS — F5105 Insomnia due to other mental disorder: Secondary | ICD-10-CM

## 2022-06-14 DIAGNOSIS — G20C Parkinsonism, unspecified: Secondary | ICD-10-CM | POA: Diagnosis not present

## 2022-06-14 DIAGNOSIS — Z79899 Other long term (current) drug therapy: Secondary | ICD-10-CM | POA: Diagnosis not present

## 2022-06-14 DIAGNOSIS — R2689 Other abnormalities of gait and mobility: Secondary | ICD-10-CM | POA: Diagnosis not present

## 2022-06-14 DIAGNOSIS — R2681 Unsteadiness on feet: Secondary | ICD-10-CM | POA: Diagnosis not present

## 2022-06-14 NOTE — Progress Notes (Signed)
Chase Mathis [id number] May 15, 1934 88 y.o.  Subjective:   Patient ID:  Chase Mathis is a 88 y.o. (DOB [birth year]) male.  Chief Complaint:  Chief Complaint  Patient presents with   Follow-up   Depression    Depression        Associated symptoms include fatigue.  Associated symptoms include no decreased concentration and no suicidal ideas.  Chase Mathis presents to the office today for follow-up of recurrence of depression that occurred at the end of 2019.  He responded to an increase in lithium from 300 to 450 mg daily..  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  He had an early relapse while on nortriptyline alone and lithium was added and he had booster ECTs and has been in remission since that time.  seen in November 2019.  His depression had gone into remission again with increase in lithium from 300 to 450 mg daily.  No med changes were made at that time.d  02/2019 appt : Continue lithium 150 mg capsules 3 daily Continue nortriptyline 50 mg capsules 2 nightly  11/30/20 lab and TC ;His nortriptyline level is 303 and should be 150 or lower.  He is on 2 of the 50 mg capsules.  Tell him to cut the dose back to one of the 50 mg capsules.  He has an appointment in August.  Let them know if he has any problem with the dosage reduction and to let us know.  02/10/2021 appointment following noted: No problems with less nortriptyline to 50 mg nightly. Fine until 2-3 mos ago.  Was working in back yard and got weak and fell and couldn't get up.  Hasn't happened again. Saw neuro Dr. Carles Mathis. No PD and probably lithium tremor.  Equivical DaTscan. Had neuropsych testing and dx with [facility]. No further episodes of this.   I'm fine now.  Remained in remission.  Patient reports stable mood and denies depressed or irritable moods.  Patient denies any recent difficulty with anxiety.  Patient denies difficulty with sleep initiation  but occ maintenance with work dreams with anxiety.  Benadryl helps. Denies appetite disturbance.  Patient reports that energy and motivation have been good.  Patient denies any difficulty with concentration.  Patient denies any suicidal ideation. Minimal tremor.  04/25/2021 appointment with the following noted: Did not get lithium level and labs as requested. [place].  Didn't think it would be a problem.  Small place.  Younger than others.He doesn't like it bc of the change similar to what he had after moving to [place] years ago leading to a severe prolonged depression.  Feels he made a mistake but trying to ease up on his thinking bc he realizes it's not all about him. Dispersed his furniture.  Thinks he's adjusting better.   Once weekly goes to men's group of [employer] mixed race.  Involved in other things.   Plan: Continue lithium 150 mg capsules 3 daily Continue nortriptyline 50 mg capsules 1 nightly Check lithium level  09/12/2021 phone call wanting to be worked in sooner due to anxiety.  09/15/2021 appointment with the following noted: seen with wife Chase Mathis Nortriptyline level ordered February 10, 2021 never completed Moved to [facility] several months ago and having trouble adjusting despite 6 years. Can't sleep and trouble with anxiety.  Trouble adjusting.  Can't shut his mind off.   Most stressful is per wife it's  crowded and small space.  She feels the same way about it.  She also feels it is a small space also.   She is struggling with some depression about it. Too. Living at [place].   He needs an office and a place to think and has an office. Usually to gym several times a week and walking with wife a couple of times per week.   Feels a need to make the place work.   Did this mainly to please the kids.   Consistent with nortriptyline 50 mg HS and lithium 450 but wife says she thinks he misses some of it. No SE. Mainly initial insomnia.  2-3  hours  nightly.Naps an hour and she thinks he might nap a little more. Active at church and in choir.  [place]. Gradually harder to do things.   Plan: Continue lithium 150 mg capsules 3 daily Continue nortriptyline 50 mg capsules 1 nightly Check lithium and nortriptyline levels. Needs something for sleep.  Lorazepam 0.5-1.0  mg HS  09/16/2021 lab note: Patient has relapsed with depression and anxiety and this is related to insufficient blood levels of his meds due to noncompliance: His nortriptyline level is less than 20 which means it is not detectable with a prescribed dose of 50 mg nightly.  In June 2020 his nortriptyline level was 303 on 100 mg daily and we reduced it to 50 mg daily.  Optimal levels are 50 to 150 ng/ml  This is highly suggestive that he is not taking nortriptyline on a regular basis.  He is missing a number of dosages.  At his last appointment his wife indicated she felt he was missing significant dosages.   His lithium level was also low and that was recently increased from 3 of the 150 mg capsules to 4 of the 150 mg capsules.  I think it was low because he was missing dosages of that as well.  He needs to use a pillbox to improve compliance.  He will not get better if he does not take his medicine. Tell him to increase nortriptyline to 75 mg at night.  I will send in prescription for 3 of the 25 mg capsules. Continue lithium 600 mg nightly.  Repeat his blood levels after about a week. I sent in a orders to [facility]. He can use the lorazepam 0.5 mg 1 twice daily for anxiety and 1-2 at night for sleep.  But this will not solve his symptoms until he is more compliant with nortriptyline and lithium as indicated in the other note sent today.  10/05/21 lab noted: On nortriptyline 75 mg nightly level was still low at 49.  Therefore nortriptyline increased to 100 mg nightly.  He needs to be very compliant with this and the lithium level which had also been  low.  10/14/2021 appointment with the following noted:  with wife Chase Mathis I'm better.  Rx lorazepam helped improve sleep taking it BID Disc noncompliance which led to relapse. Wife and D in law are making sure he is taking the meds. But not using the pill box. Admits he was neglecting himself and doesn't know why that was necessary.   Feels much better.  Goes to gym and wants to do it again and didn't want to do it when depressed.   Sleep and less anxious.  More interest in things.  More enjoyment. Increased tremor and dropping some pills Appetite had gone down over a couple of mos and lost  20# but appetite has returned. Wife notices he is having some mild inattentiveness and forgetfulness.  11/29/21 appt noted: Tolerated in crease meds. Ativan helped sleep and insomnia triggered the problems . Lihtium tremor is mild Sing in choir. More consistent with meds and no mistakes. Out of the depression and anxiety Plan: Continue lithium 150 mg capsules 4 daily Continue nortriptyline 75 mg capsules 1 nightly Check lithium and nortriptyline levels again.   06/14/22 appt noted: Normal B12 in March 2023 Dr. Carles Mathis dx periperal neuropathy and likely PD and also ikely lithium tremor.  Sinemet hasn't helped tremor. Not a severe tremor but is annoying. Mood is good and Chase Mathis agrees .  Not that anxious and hasn't needed lorazepam.   Sleep is good.   Consistent. [place] resident.  Past Psychiatric Medication Trials:  ECT Paroxetine 60, duloxetine anxiety, sertraline 3 weeks, Lexapro 10 x 30 days, Effexor 225 Abilify 5 no response Quetiapine Geodon Adderall trazodone November 2018, we reduced the nortriptyline from 150 mg to 100 mg daily and reduced the lithium from 450 mg a day to 300 mg daily because of some tremor and because he wanted to try to reduce the medication.  His blood levels on the higher dosages were lithium 0.7 nortriptyline 103.   On the lower dosages the level in October were:   Li 0.4.  Nortriptyline 224.  Review of Systems:  Review of Systems  Constitutional:  Positive for fatigue.  Cardiovascular:  Negative for palpitations.  Gastrointestinal:  Negative for diarrhea.  Musculoskeletal:  Positive for gait problem.  Neurological:  Positive for tremors. Negative for dizziness and weakness.  Psychiatric/Behavioral:  Positive for depression. Negative for agitation, behavioral problems, confusion, decreased concentration, dysphoric mood, hallucinations, self-injury, sleep disturbance and suicidal ideas. The patient is not nervous/anxious and is not hyperactive.     Medications: I have reviewed the patient's current medications.  Current Outpatient Medications  Medication Sig Dispense Refill   amLODipine (NORVASC) 5 MG tablet Take 5 mg by mouth daily.     carbidopa-levodopa (SINEMET IR) 25-100 MG tablet Take 1 tablet by mouth 3 (three) times daily. 6am/11am/4pm 270 tablet 1   finasteride (PROSCAR) 5 MG tablet Take 1 tablet (5 mg total) by mouth daily. 90 tablet 3   lisinopril (PRINIVIL,ZESTRIL) 10 MG tablet Take 10 mg by mouth daily.     LORazepam (ATIVAN) 0.5 MG tablet TAKE 1-2 TABLETS (0.5-1 MG TOTAL) BY MOUTH AT BEDTIME. 60 tablet 1   nortriptyline (PAMELOR) 25 MG capsule TAKE 3 CAPSULES (75 MG TOTAL) BY MOUTH AT BEDTIME. 270 capsule 0   vitamin B-12 (CYANOCOBALAMIN) 50 MCG tablet Take 50 mcg by mouth daily.     lithium carbonate 150 MG capsule TAKE 4 CAPSULES (600 MG TOTAL) BY MOUTH AT BEDTIME. 360 capsule 0   No current facility-administered medications for this visit.    Medication Side Effects: None  Allergies: No Known Allergies  Past Medical History:  Diagnosis Date   Benign non-nodular prostatic hyperplasia with lower urinary tract symptoms 12/21/2014   Chronic sinusitis 08/23/2017   Combined forms of age-related cataract of both eyes 06/15/2016   Elevated prostate specific antigen (PSA) 07/03/2011   Epiretinal membrane, bilateral 06/03/2015    Epistaxis 08/16/2017   History of electroconvulsive therapy    Hypertension    Left ureteral stone 01/28/2015   Lithium-induced tremor    Major depressive disorder in remission    MCI (mild cognitive impairment) with memory loss 01/05/2021   Peripheral neuropathy    Recurrent nephrolithiasis  09/02/2017   Vitreous syneresis of both eyes 06/03/2015    Family History  Problem Relation Age of Onset   Lung cancer Mother    Heart disease Father    Pancreatic cancer Sister    Healthy Son    Healthy Daughter     Social History   Socioeconomic History   Marital status: Married    Spouse name: Not on file   Number of children: Not on file   Years of education: 20   Highest education level: Professional school degree (e.g., MD, DDS, DVM, JD)  Occupational History   Occupation: retired    Comment: urologist  Tobacco Use   Smoking status: Never   Smokeless tobacco: Never  Substance and Sexual Activity   Alcohol use: Yes    Comment: very rare   Drug use: No   Sexual activity: Yes  Other Topics Concern   Not on file  Social History Narrative   Right handed   Drinks caffeine    Two story home   Social Determinants of Health   Financial Resource Strain: Not on file  Food Insecurity: Not on file  Transportation Needs: Not on file  Physical Activity: Not on file  Stress: Not on file  Social Connections: Not on file  Intimate Partner Violence: Not on file    Past Medical History, Surgical history, Social history, and Family history were reviewed and updated as appropriate.   Please see review of systems for further details on the patient's review from today.   Objective:   Physical Exam:  There were no vitals taken for this visit.  Physical Exam Constitutional:      General: He is not in acute distress.    Appearance: He is well-developed.  Musculoskeletal:        General: No deformity.  Neurological:     Mental Status: He is alert and oriented to person, place,  and time.     Motor: No tremor.     Coordination: Coordination normal.     Gait: Gait normal.  Psychiatric:        Attention and Perception: Attention and perception normal.        Mood and Affect: Mood is not anxious or depressed. Affect is not labile, blunt, angry or inappropriate.        Speech: Speech normal.        Behavior: Behavior normal.        Thought Content: Thought content normal. Thought content is not delusional. Thought content does not include homicidal or suicidal ideation. Thought content does not include suicidal plan.        Cognition and Memory: Cognition normal.        Judgment: Judgment normal.     Comments: Insight intact. No auditory or visual hallucinations. No delusions.  Depression resolved upbeat     Lab Review:     Component Value Date/Time   NA 138 03/25/2019 1101   K 5.1 03/25/2019 1101   CL 101 03/25/2019 1101   CO2 20 03/25/2019 1101   GLUCOSE 99 03/25/2019 1101   GLUCOSE 81 04/18/2018 0937   BUN 20 03/25/2019 1101   CREATININE 1.14 03/25/2019 1101   CREATININE 1.18 (H) 04/18/2018 0937   CALCIUM 9.7 03/25/2019 1101   PROT 7.5 12/19/2016 2211   ALBUMIN 4.4 12/19/2016 2211   AST 29 12/19/2016 2211   ALT 42 12/19/2016 2211   ALKPHOS 54 12/19/2016 2211   BILITOT 0.7 12/19/2016 2211   GFRNONAA 59 (L) 03/25/2019  1101   GFRAA 68 03/25/2019 1101       Component Value Date/Time   WBC 12.5 (H) 12/19/2016 2211   RBC 5.19 12/19/2016 2211   HGB 15.6 12/19/2016 2211   HCT 46.2 12/19/2016 2211   PLT 265 12/19/2016 2211   MCV 89.0 12/19/2016 2211   MCH 30.1 12/19/2016 2211   MCHC 33.8 12/19/2016 2211   RDW 14.1 12/19/2016 2211   LYMPHSABS 1.3 12/19/2016 2211   MONOABS 0.9 12/19/2016 2211   EOSABS 0.1 12/19/2016 2211   BASOSABS 0.0 12/19/2016 2211    Lithium Lvl  Date Value Ref Range Status  10/20/2021 0.6 0.5 - 1.2 mmol/L Final    Comment:    A concentration of 0.5-0.8 mmol/L is advised for long-term use; concentrations of up to 1.2  mmol/L may be necessary during acute treatment.                                  Detection Limit = 0.1                           <0.1 indicates None Detected    11/30/20 lab and TC ;His nortriptyline level is 303 and should be 150 or lower.  He is on 2 of the 50 mg capsules.  05/03/2021 lithium level 0.8 on 450 mg nightly  Normal B12 in March 2023  No results found for: "PHENYTOIN", "PHENOBARB", "VALPROATE", "CBMZ"   .res Assessment: Plan:    Recurrent major depression in full remission ([place]) - Plan: TSH, Basic metabolic panel, Lithium level, Nortriptyline (Aventyl), Serum [[facility]]  Insomnia due to mental condition  Lithium use - Plan: TSH, Basic metabolic panel, Lithium level  Lithium-induced tremor   Greater than 50% of 30 min face to face time with patient was spent on counseling and coordination of care. He is seen urgently following recent worsening of depression and anxiety. He started to see some improvement with more consistency with nortriptyline and lithium.  March 2023 relapse of depression.  It was discovered by blood levels as well as by history that there was some inconsistency with compliance with both nortriptyline and lithium.  Lithium dosage has been increased and nortriptyline level has been increased.  He is seeing some improvement in the depression and anxiety sleep and appetite and interest.  Much better with lithium and nortriptyline now that he's compliant again. We have to be consistent lithium levels of 0.6 on 600 mg daily. Nortriptyline level on 75 mg nightly  91 and in the middle of the normal range in the past  He is having some side effects with the lithium including tremor and dropping things.  Discussed side effects of both these medications. Counseled patient regarding potential benefits, risks, and side effects of lithium to include potential risk of lithium affecting thyroid and renal function.  Discussed need for periodic lab monitoring to  determine drug level and to assess for potential adverse effects.  Counseled patient regarding signs and symptoms of lithium toxicity and advised that they notify office immediately or seek urgent medical attention if experiencing these signs and symptoms.  Patient advised to contact office with any questions or concerns.  Continue lithium 150 mg capsules 4 daily Continue nortriptyline 75 mg capsules 1 nightly  Check lithium and nortriptyline levels again.  Order sent to [facility].  Discussed the risk of toxicity due to recent unstable blood  levels.  Will try to reduce if possible.  Discussed side effects of nortriptyline which could include anticholinergic side effects because of his age could include symptoms like confusion.  He has no such side effects at this time.  Contact us if you have the symptoms or any other symptoms such as sedation etc.  B12 level and need for supplements.  He had stopped. History low B12.  Was OK.  Consider change antidepressant.  Continues gym and church Discussed relapse prevention techniques.  He had a recent bout of depression after downsizing to [place].   Follow-up 3-4 mos .  Lynder Parents MD, DFAPA  Addendum: Lithium level 0.6.  BMP was normal.  TSH normal.  Nortriptyline pending  Please see After Visit Summary for patient specific instructions.  Future Appointments  Date Time Provider [place]  09/13/2022  2:30 PM Cottle, Billey Co., MD [facility] None  10/05/2022  8:45 AM Tat, Eustace Quail, DO [facility] None    Orders Placed This Encounter  Procedures   TSH   Basic metabolic panel   Lithium level   Nortriptyline (Aventyl), Serum [[facility]]      -------------------------------

## 2022-06-20 ENCOUNTER — Other Ambulatory Visit: Payer: Self-pay | Admitting: Psychiatry

## 2022-06-20 DIAGNOSIS — F3342 Major depressive disorder, recurrent, in full remission: Secondary | ICD-10-CM

## 2022-06-21 DIAGNOSIS — G20C Parkinsonism, unspecified: Secondary | ICD-10-CM | POA: Diagnosis not present

## 2022-06-21 DIAGNOSIS — R2681 Unsteadiness on feet: Secondary | ICD-10-CM | POA: Diagnosis not present

## 2022-06-21 DIAGNOSIS — R2689 Other abnormalities of gait and mobility: Secondary | ICD-10-CM | POA: Diagnosis not present

## 2022-06-22 DIAGNOSIS — F3342 Major depressive disorder, recurrent, in full remission: Secondary | ICD-10-CM | POA: Diagnosis not present

## 2022-06-22 DIAGNOSIS — Z79899 Other long term (current) drug therapy: Secondary | ICD-10-CM | POA: Diagnosis not present

## 2022-06-23 ENCOUNTER — Encounter: Payer: Self-pay | Admitting: Psychiatry

## 2022-06-23 DIAGNOSIS — G20C Parkinsonism, unspecified: Secondary | ICD-10-CM | POA: Diagnosis not present

## 2022-06-23 DIAGNOSIS — R2689 Other abnormalities of gait and mobility: Secondary | ICD-10-CM | POA: Diagnosis not present

## 2022-06-23 DIAGNOSIS — R2681 Unsteadiness on feet: Secondary | ICD-10-CM | POA: Diagnosis not present

## 2022-06-25 ENCOUNTER — Encounter: Payer: Self-pay | Admitting: Psychiatry

## 2022-06-25 NOTE — Progress Notes (Signed)
Repeat lithium level now is 0.6.  Normal BMP and TSH.  Awaiting nortriptyline level. We never received the nortriptyline level.  Please contact [facility] for it..  see results.

## 2022-06-28 DIAGNOSIS — R2681 Unsteadiness on feet: Secondary | ICD-10-CM | POA: Diagnosis not present

## 2022-06-28 DIAGNOSIS — R2689 Other abnormalities of gait and mobility: Secondary | ICD-10-CM | POA: Diagnosis not present

## 2022-06-28 DIAGNOSIS — G20C Parkinsonism, unspecified: Secondary | ICD-10-CM | POA: Diagnosis not present

## 2022-06-28 LAB — BASIC METABOLIC PANEL
BUN/Creatinine Ratio: 17 (ref 10–24)
BUN: 19 mg/dL (ref 8–27)
CO2: 22 mmol/L (ref 20–29)
Calcium: 9.5 mg/dL (ref 8.6–10.2)
Chloride: 104 mmol/L (ref 96–106)
Creatinine, Ser: 1.15 mg/dL (ref 0.76–1.27)
Glucose: 144 mg/dL — ABNORMAL HIGH (ref 70–99)
Potassium: 4.6 mmol/L (ref 3.5–5.2)
Sodium: 143 mmol/L (ref 134–144)
eGFR: 62 mL/min/{1.73_m2} (ref >59)

## 2022-06-28 LAB — LITHIUM LEVEL: Lithium Lvl: 0.6 mmol/L (ref 0.5–1.2)

## 2022-06-28 LAB — NORTRIPTYLINE (AVENTYL), SERUM: Nortriptyline (Aventyl), Serum: 32 ng/mL — ABNORMAL LOW (ref 50–150)

## 2022-06-28 LAB — TSH: TSH: 3.92 u[IU]/mL (ref 0.450–4.500)

## 2022-06-28 NOTE — Progress Notes (Signed)
Results are available in epic now

## 2022-06-29 ENCOUNTER — Other Ambulatory Visit: Payer: Self-pay | Admitting: Psychiatry

## 2022-06-30 ENCOUNTER — Other Ambulatory Visit: Payer: Self-pay | Admitting: Psychiatry

## 2022-06-30 DIAGNOSIS — G20C Parkinsonism, unspecified: Secondary | ICD-10-CM | POA: Diagnosis not present

## 2022-06-30 DIAGNOSIS — R2681 Unsteadiness on feet: Secondary | ICD-10-CM | POA: Diagnosis not present

## 2022-06-30 DIAGNOSIS — F3342 Major depressive disorder, recurrent, in full remission: Secondary | ICD-10-CM

## 2022-06-30 DIAGNOSIS — R2689 Other abnormalities of gait and mobility: Secondary | ICD-10-CM | POA: Diagnosis not present

## 2022-06-30 NOTE — Progress Notes (Signed)
His basic metabolic panel and TSH were normal.  His lithium level was good at 0.6.  However his nortriptyline level was lower than I expected at 32.  He should be taking 75 mg at night which is 3 of the 25 mg capsules. He had a relapse of depression in 2023 after being stable for a decade due to becoming forgetting doses of nortriptyline and lithium and the blood levels dropped too low. He is 89 years old and is getting a little forgetful. Please check to see that he is consistently taking the nortriptyline 75 mg at night.  With the next refill to make it easier we should send in a 75 mg capsule 1 nightly.  Suggest that he use a pillbox to ensure compliance if he is not doing so.  I am concerned that this low blood level may lead to another recurrence of depression. Lynder Parents, MD, DFAPA

## 2022-07-04 DIAGNOSIS — R2689 Other abnormalities of gait and mobility: Secondary | ICD-10-CM | POA: Diagnosis not present

## 2022-07-04 DIAGNOSIS — G20C Parkinsonism, unspecified: Secondary | ICD-10-CM | POA: Diagnosis not present

## 2022-07-04 DIAGNOSIS — R2681 Unsteadiness on feet: Secondary | ICD-10-CM | POA: Diagnosis not present

## 2022-07-05 NOTE — Progress Notes (Signed)
Left pt message on his cell phone, his home # just keeps ringing.

## 2022-07-07 DIAGNOSIS — R2689 Other abnormalities of gait and mobility: Secondary | ICD-10-CM | POA: Diagnosis not present

## 2022-07-07 DIAGNOSIS — R2681 Unsteadiness on feet: Secondary | ICD-10-CM | POA: Diagnosis not present

## 2022-07-07 DIAGNOSIS — G20C Parkinsonism, unspecified: Secondary | ICD-10-CM | POA: Diagnosis not present

## 2022-07-10 DIAGNOSIS — G20C Parkinsonism, unspecified: Secondary | ICD-10-CM | POA: Diagnosis not present

## 2022-07-10 DIAGNOSIS — R2681 Unsteadiness on feet: Secondary | ICD-10-CM | POA: Diagnosis not present

## 2022-07-10 DIAGNOSIS — R2689 Other abnormalities of gait and mobility: Secondary | ICD-10-CM | POA: Diagnosis not present

## 2022-07-13 DIAGNOSIS — G20C Parkinsonism, unspecified: Secondary | ICD-10-CM | POA: Diagnosis not present

## 2022-07-13 DIAGNOSIS — R2689 Other abnormalities of gait and mobility: Secondary | ICD-10-CM | POA: Diagnosis not present

## 2022-07-13 DIAGNOSIS — R2681 Unsteadiness on feet: Secondary | ICD-10-CM | POA: Diagnosis not present

## 2022-07-18 ENCOUNTER — Telehealth: Payer: Self-pay | Admitting: Neurology

## 2022-07-18 NOTE — Telephone Encounter (Signed)
Called and left message for patient.

## 2022-07-18 NOTE — Telephone Encounter (Signed)
Pt called in stating he has been having tremors so bad he is having trouble walking. He is concerned. He says this seems to be new and would like to speak with someone about it.

## 2022-07-19 DIAGNOSIS — R2689 Other abnormalities of gait and mobility: Secondary | ICD-10-CM | POA: Diagnosis not present

## 2022-07-19 DIAGNOSIS — R2681 Unsteadiness on feet: Secondary | ICD-10-CM | POA: Diagnosis not present

## 2022-07-19 DIAGNOSIS — G20C Parkinsonism, unspecified: Secondary | ICD-10-CM | POA: Diagnosis not present

## 2022-07-19 NOTE — Telephone Encounter (Signed)
Called patient and left voicemail message.

## 2022-07-21 DIAGNOSIS — G20C Parkinsonism, unspecified: Secondary | ICD-10-CM | POA: Diagnosis not present

## 2022-07-21 DIAGNOSIS — R2689 Other abnormalities of gait and mobility: Secondary | ICD-10-CM | POA: Diagnosis not present

## 2022-07-21 DIAGNOSIS — R2681 Unsteadiness on feet: Secondary | ICD-10-CM | POA: Diagnosis not present

## 2022-07-21 NOTE — Telephone Encounter (Signed)
Called and left voice mail

## 2022-07-21 NOTE — Telephone Encounter (Signed)
Talked to patient and wife. Patient stated he is not having tremors but dyskinesia. He said it started two weeks ago after the lithium levels were checked . Patient is taking 3 caps at night of nortriptyline

## 2022-07-21 NOTE — Telephone Encounter (Signed)
Patient is sch for 07-25-22   He would like to speak to someone about medication please call

## 2022-07-24 DIAGNOSIS — R2681 Unsteadiness on feet: Secondary | ICD-10-CM | POA: Diagnosis not present

## 2022-07-24 DIAGNOSIS — G20C Parkinsonism, unspecified: Secondary | ICD-10-CM | POA: Diagnosis not present

## 2022-07-24 DIAGNOSIS — R2689 Other abnormalities of gait and mobility: Secondary | ICD-10-CM | POA: Diagnosis not present

## 2022-07-24 NOTE — Progress Notes (Unsigned)
Assessment/Plan:   1.  Gait instabity  -Peripheral neuropathy likely contributes.  He is on a B12 supplement.             -Patient is also on nortriptyline, which is an anticholinergic and certainly can contribute to gait instability, although he is not on a large dose and did worse with his depression when he was off of it.  -Skin biopsy negative for alpha-synuclein in August, 2023  -skin bx was pos for PN which could produce gait instability  -DaTscan did demonstrate decreased uptake in the left caudate more than the right and mild decreased activity in the left posterior stratum.  -Patient's case is very complex, partially because the presence of tremor inducing agents, and because of the fact that his skin biopsy has been negative and DaTscan has been marginally positive.  We have decided to trial him on carbidopa/levodopa 25/100 3 times daily.  Titration schedule was given.  -refer to PT for gait   2.  B12 deficiency             -Patient now on supplementation.   3.  Tremor             -This is likely due to the lithium, and we discussed that again today.  He has both intention tremor (likely from lithium), but he has intermittent rest tremor as well (I have seen this on both sides independently)..  Studies are varied, but incidate that up to 2/3 of patients on lithium will have lithium-induced tremor, even if the patients are not toxic on the medication.  This is just a common side effect of patients on lithium.  It doesn't particularly bother him and is very mild.  The benefits of the medication outweigh the SE of tremor.   4.  Memory change  -Neurocognitive testing done January 04, 2021 with evidence of MCI only.  Felt that this could be due to medication (nortriptyline, lithium).    Subjective:   [facility] was seen today in neurologic follow-up.  Records reviewed since last visit.  Pt with wife who supplements hx. they called last week to state that he was having tremors again  and that he could not work.  We called them back, the patient states that they were not tremors but "dyskinesia."  Patient stated that the symptoms were going on for about 2 weeks.  He had his lithium level was recently checked.  Lithium was checked at the end of December and December 28 and was 0.6.    ALLERGIES:  No Known Allergies  CURRENT MEDICATIONS:  Outpatient Encounter Medications as of 07/25/2022  Medication Sig   amLODipine (NORVASC) 5 MG tablet Take 5 mg by mouth daily.   carbidopa-levodopa (SINEMET IR) 25-100 MG tablet Take 1 tablet by mouth 3 (three) times daily. 6am/11am/4pm   finasteride (PROSCAR) 5 MG tablet Take 1 tablet (5 mg total) by mouth daily.   lisinopril (PRINIVIL,ZESTRIL) 10 MG tablet Take 10 mg by mouth daily.   lithium carbonate 150 MG capsule TAKE 4 CAPSULES (600 MG TOTAL) BY MOUTH AT BEDTIME.   LORazepam (ATIVAN) 0.5 MG tablet TAKE 1-2 TABLETS (0.5-1 MG TOTAL) BY MOUTH AT BEDTIME.   nortriptyline (PAMELOR) 25 MG capsule TAKE 3 CAPSULES (75 MG TOTAL) BY MOUTH AT BEDTIME.   vitamin B-12 (CYANOCOBALAMIN) 50 MCG tablet Take 50 mcg by mouth daily.   No facility-administered encounter medications on file as of 07/25/2022.    Objective:   PHYSICAL EXAMINATION:  VITALS:   There were no vitals filed for this visit.      GEN:  Normal appears male in no acute distress.  Appears younger than stated age. HEENT:  Normocephalic, atraumatic. The mucous membranes are moist.  Cardiovascular: Regular rate rhythm Lungs: Clear to auscultation bilaterally  NEUROLOGICAL: Orientation: The patient is alert and oriented x3.  Cranial nerves: There is good facial symmetry.  Extraocular muscles are intact. The visual fields are full to confrontational testing. The speech is fluent and clear. Soft palate rises symmetrically and there is no tongue deviation. Hearing is intact to conversational tone. Sensation: Sensation is intact to light touch throughout. Motor: Strength is  5/5 in the bilateral upper and lower extremities.   Shoulder shrug is equal and symmetric.  There is no pronator drift.    Movement examination: Tone: There is slight increased tone in the right upper extremity. Abnormal movements: There is no rest tremor today.  There is intention tremor.  There is postural tremor bilaterally. Coordination:  There is mild decremation on the right. Gait and Station: The patient arises out of the chair without the use of his hands, but he nearly falls back (uses examiner's hand to steady himself).  He is slightly short stepped and drags the heels when he ambulates, but does not shuffle the rest of the foot.   Chemistry      Component Value Date/Time   NA 143 06/22/2022 0849   K 4.6 06/22/2022 0849   CL 104 06/22/2022 0849   CO2 22 06/22/2022 0849   BUN 19 06/22/2022 0849   CREATININE 1.15 06/22/2022 0849   CREATININE 1.18 (H) 04/18/2018 0937      Component Value Date/Time   CALCIUM 9.5 06/22/2022 0849   ALKPHOS 54 12/19/2016 2211   AST 29 12/19/2016 2211   ALT 42 12/19/2016 2211   BILITOT 0.7 12/19/2016 2211     Lab Results  Component Value Date   VITAMINB12 >2000 (H) 09/16/2021      Total time spent on today's visit was *** minutes, including both face-to-face time and nonface-to-face time.  Time included that spent on review of records (prior notes available to me/labs/imaging if pertinent), discussing treatment and goals, answering patient's questions and coordinating care.   Cc:  Lajean Manes, MD

## 2022-07-25 ENCOUNTER — Encounter: Payer: Self-pay | Admitting: Neurology

## 2022-07-25 ENCOUNTER — Ambulatory Visit (INDEPENDENT_AMBULATORY_CARE_PROVIDER_SITE_OTHER): Payer: Medicare Other | Admitting: Neurology

## 2022-07-25 VITALS — BP 138/82 | HR 71 | Ht 68.0 in | Wt 221.0 lb

## 2022-07-25 DIAGNOSIS — G20C Parkinsonism, unspecified: Secondary | ICD-10-CM

## 2022-07-25 DIAGNOSIS — G251 Drug-induced tremor: Secondary | ICD-10-CM | POA: Diagnosis not present

## 2022-07-25 DIAGNOSIS — R29898 Other symptoms and signs involving the musculoskeletal system: Secondary | ICD-10-CM | POA: Diagnosis not present

## 2022-07-25 NOTE — Patient Instructions (Signed)
A referral to [facility] has been placed for your MRI someone will contact you directly to schedule your appt. They are located at [place]. Please contact them directly by calling [phone] with any questions regarding your referral.  You need to use a walker at all times!  Let us know if you need a RX for this.  You can use the traditional 2 wheels/2 tennis balls or a U step.  Video tape the abnormal movements if you have them.  The physicians and staff at [facility] are committed to providing excellent care. You may receive a survey requesting feedback about your experience at our office. We strive to receive "very good" responses to the survey questions. If you feel that your experience would prevent you from giving the office a "very good " response, please contact our office to try to remedy the situation. We may be reached at [phone]. Thank you for taking the time out of your busy day to complete the survey.

## 2022-07-26 DIAGNOSIS — R2689 Other abnormalities of gait and mobility: Secondary | ICD-10-CM | POA: Diagnosis not present

## 2022-07-26 DIAGNOSIS — R2681 Unsteadiness on feet: Secondary | ICD-10-CM | POA: Diagnosis not present

## 2022-07-26 DIAGNOSIS — G20C Parkinsonism, unspecified: Secondary | ICD-10-CM | POA: Diagnosis not present

## 2022-07-28 DIAGNOSIS — R2689 Other abnormalities of gait and mobility: Secondary | ICD-10-CM | POA: Diagnosis not present

## 2022-07-28 DIAGNOSIS — R2681 Unsteadiness on feet: Secondary | ICD-10-CM | POA: Diagnosis not present

## 2022-07-28 DIAGNOSIS — G20C Parkinsonism, unspecified: Secondary | ICD-10-CM | POA: Diagnosis not present

## 2022-07-31 DIAGNOSIS — R2681 Unsteadiness on feet: Secondary | ICD-10-CM | POA: Diagnosis not present

## 2022-07-31 DIAGNOSIS — G20C Parkinsonism, unspecified: Secondary | ICD-10-CM | POA: Diagnosis not present

## 2022-07-31 DIAGNOSIS — R2689 Other abnormalities of gait and mobility: Secondary | ICD-10-CM | POA: Diagnosis not present

## 2022-08-02 DIAGNOSIS — G20C Parkinsonism, unspecified: Secondary | ICD-10-CM | POA: Diagnosis not present

## 2022-08-02 DIAGNOSIS — R2681 Unsteadiness on feet: Secondary | ICD-10-CM | POA: Diagnosis not present

## 2022-08-02 DIAGNOSIS — R2689 Other abnormalities of gait and mobility: Secondary | ICD-10-CM | POA: Diagnosis not present

## 2022-08-03 ENCOUNTER — Telehealth: Payer: Self-pay | Admitting: Psychiatry

## 2022-08-03 NOTE — Telephone Encounter (Signed)
Please advise 

## 2022-08-03 NOTE — Telephone Encounter (Signed)
[name], wife, asks if he can come off of the lorazepam? He is too sleepy and sleeping way too much. Gets up early and is going back to bed at 8 or 9 and sleeping a few hours again.

## 2022-08-04 DIAGNOSIS — G20C Parkinsonism, unspecified: Secondary | ICD-10-CM | POA: Diagnosis not present

## 2022-08-04 DIAGNOSIS — R2681 Unsteadiness on feet: Secondary | ICD-10-CM | POA: Diagnosis not present

## 2022-08-04 DIAGNOSIS — R2689 Other abnormalities of gait and mobility: Secondary | ICD-10-CM | POA: Diagnosis not present

## 2022-08-04 NOTE — Telephone Encounter (Signed)
LVM with info

## 2022-08-04 NOTE — Telephone Encounter (Signed)
I thought I sent a response to this but tell her or him to cut the dose by 50% for 2 weeks and see how he does.  If he's slepeing ok then stop it.

## 2022-08-08 DIAGNOSIS — R2681 Unsteadiness on feet: Secondary | ICD-10-CM | POA: Diagnosis not present

## 2022-08-08 DIAGNOSIS — R2689 Other abnormalities of gait and mobility: Secondary | ICD-10-CM | POA: Diagnosis not present

## 2022-08-08 DIAGNOSIS — G20C Parkinsonism, unspecified: Secondary | ICD-10-CM | POA: Diagnosis not present

## 2022-08-09 DIAGNOSIS — G20C Parkinsonism, unspecified: Secondary | ICD-10-CM | POA: Diagnosis not present

## 2022-08-09 DIAGNOSIS — R2689 Other abnormalities of gait and mobility: Secondary | ICD-10-CM | POA: Diagnosis not present

## 2022-08-09 DIAGNOSIS — R2681 Unsteadiness on feet: Secondary | ICD-10-CM | POA: Diagnosis not present

## 2022-08-11 DIAGNOSIS — G20C Parkinsonism, unspecified: Secondary | ICD-10-CM | POA: Diagnosis not present

## 2022-08-11 DIAGNOSIS — R2689 Other abnormalities of gait and mobility: Secondary | ICD-10-CM | POA: Diagnosis not present

## 2022-08-11 DIAGNOSIS — R2681 Unsteadiness on feet: Secondary | ICD-10-CM | POA: Diagnosis not present

## 2022-08-12 ENCOUNTER — Ambulatory Visit
Admission: RE | Admit: 2022-08-12 | Discharge: 2022-08-12 | Disposition: A | Discharge status: Home / Self Care | Payer: Medicare Other | Source: Ambulatory Visit | Attending: Neurology | Admitting: Neurology

## 2022-08-12 DIAGNOSIS — I6782 Cerebral ischemia: Secondary | ICD-10-CM | POA: Diagnosis not present

## 2022-08-12 DIAGNOSIS — R29898 Other symptoms and signs involving the musculoskeletal system: Secondary | ICD-10-CM

## 2022-08-14 DIAGNOSIS — G20C Parkinsonism, unspecified: Secondary | ICD-10-CM | POA: Diagnosis not present

## 2022-08-14 DIAGNOSIS — R2689 Other abnormalities of gait and mobility: Secondary | ICD-10-CM | POA: Diagnosis not present

## 2022-08-14 DIAGNOSIS — R2681 Unsteadiness on feet: Secondary | ICD-10-CM | POA: Diagnosis not present

## 2022-08-15 ENCOUNTER — Telehealth: Payer: Self-pay

## 2022-08-15 NOTE — Telephone Encounter (Signed)
Called patient back unable to reach left voicemail

## 2022-08-15 NOTE — Telephone Encounter (Signed)
Spoke to patient and gave MRI results

## 2022-08-15 NOTE — Telephone Encounter (Signed)
Patient is returning a call about MRI results.

## 2022-08-16 DIAGNOSIS — R2681 Unsteadiness on feet: Secondary | ICD-10-CM | POA: Diagnosis not present

## 2022-08-16 DIAGNOSIS — R2689 Other abnormalities of gait and mobility: Secondary | ICD-10-CM | POA: Diagnosis not present

## 2022-08-16 DIAGNOSIS — G20C Parkinsonism, unspecified: Secondary | ICD-10-CM | POA: Diagnosis not present

## 2022-08-18 DIAGNOSIS — G20C Parkinsonism, unspecified: Secondary | ICD-10-CM | POA: Diagnosis not present

## 2022-08-18 DIAGNOSIS — R2689 Other abnormalities of gait and mobility: Secondary | ICD-10-CM | POA: Diagnosis not present

## 2022-08-18 DIAGNOSIS — R2681 Unsteadiness on feet: Secondary | ICD-10-CM | POA: Diagnosis not present

## 2022-08-21 DIAGNOSIS — R2689 Other abnormalities of gait and mobility: Secondary | ICD-10-CM | POA: Diagnosis not present

## 2022-08-21 DIAGNOSIS — G20C Parkinsonism, unspecified: Secondary | ICD-10-CM | POA: Diagnosis not present

## 2022-08-21 DIAGNOSIS — R2681 Unsteadiness on feet: Secondary | ICD-10-CM | POA: Diagnosis not present

## 2022-08-25 DIAGNOSIS — R2681 Unsteadiness on feet: Secondary | ICD-10-CM | POA: Diagnosis not present

## 2022-08-25 DIAGNOSIS — M6281 Muscle weakness (generalized): Secondary | ICD-10-CM | POA: Diagnosis not present

## 2022-08-25 DIAGNOSIS — R2689 Other abnormalities of gait and mobility: Secondary | ICD-10-CM | POA: Diagnosis not present

## 2022-08-25 DIAGNOSIS — G20C Parkinsonism, unspecified: Secondary | ICD-10-CM | POA: Diagnosis not present

## 2022-08-28 DIAGNOSIS — R2689 Other abnormalities of gait and mobility: Secondary | ICD-10-CM | POA: Diagnosis not present

## 2022-08-28 DIAGNOSIS — M6281 Muscle weakness (generalized): Secondary | ICD-10-CM | POA: Diagnosis not present

## 2022-08-28 DIAGNOSIS — R2681 Unsteadiness on feet: Secondary | ICD-10-CM | POA: Diagnosis not present

## 2022-08-28 DIAGNOSIS — G20C Parkinsonism, unspecified: Secondary | ICD-10-CM | POA: Diagnosis not present

## 2022-08-31 DIAGNOSIS — G20C Parkinsonism, unspecified: Secondary | ICD-10-CM | POA: Diagnosis not present

## 2022-08-31 DIAGNOSIS — R2681 Unsteadiness on feet: Secondary | ICD-10-CM | POA: Diagnosis not present

## 2022-08-31 DIAGNOSIS — R2689 Other abnormalities of gait and mobility: Secondary | ICD-10-CM | POA: Diagnosis not present

## 2022-08-31 DIAGNOSIS — M6281 Muscle weakness (generalized): Secondary | ICD-10-CM | POA: Diagnosis not present

## 2022-09-01 DIAGNOSIS — R2689 Other abnormalities of gait and mobility: Secondary | ICD-10-CM | POA: Diagnosis not present

## 2022-09-01 DIAGNOSIS — R2681 Unsteadiness on feet: Secondary | ICD-10-CM | POA: Diagnosis not present

## 2022-09-01 DIAGNOSIS — G20C Parkinsonism, unspecified: Secondary | ICD-10-CM | POA: Diagnosis not present

## 2022-09-01 DIAGNOSIS — M6281 Muscle weakness (generalized): Secondary | ICD-10-CM | POA: Diagnosis not present

## 2022-09-04 DIAGNOSIS — G20C Parkinsonism, unspecified: Secondary | ICD-10-CM | POA: Diagnosis not present

## 2022-09-04 DIAGNOSIS — R2681 Unsteadiness on feet: Secondary | ICD-10-CM | POA: Diagnosis not present

## 2022-09-04 DIAGNOSIS — M6281 Muscle weakness (generalized): Secondary | ICD-10-CM | POA: Diagnosis not present

## 2022-09-04 DIAGNOSIS — R2689 Other abnormalities of gait and mobility: Secondary | ICD-10-CM | POA: Diagnosis not present

## 2022-09-05 DIAGNOSIS — R2681 Unsteadiness on feet: Secondary | ICD-10-CM | POA: Diagnosis not present

## 2022-09-05 DIAGNOSIS — G20C Parkinsonism, unspecified: Secondary | ICD-10-CM | POA: Diagnosis not present

## 2022-09-05 DIAGNOSIS — M6281 Muscle weakness (generalized): Secondary | ICD-10-CM | POA: Diagnosis not present

## 2022-09-05 DIAGNOSIS — R2689 Other abnormalities of gait and mobility: Secondary | ICD-10-CM | POA: Diagnosis not present

## 2022-09-06 DIAGNOSIS — R2689 Other abnormalities of gait and mobility: Secondary | ICD-10-CM | POA: Diagnosis not present

## 2022-09-06 DIAGNOSIS — R2681 Unsteadiness on feet: Secondary | ICD-10-CM | POA: Diagnosis not present

## 2022-09-06 DIAGNOSIS — M6281 Muscle weakness (generalized): Secondary | ICD-10-CM | POA: Diagnosis not present

## 2022-09-06 DIAGNOSIS — G20C Parkinsonism, unspecified: Secondary | ICD-10-CM | POA: Diagnosis not present

## 2022-09-07 DIAGNOSIS — M6281 Muscle weakness (generalized): Secondary | ICD-10-CM | POA: Diagnosis not present

## 2022-09-07 DIAGNOSIS — R2681 Unsteadiness on feet: Secondary | ICD-10-CM | POA: Diagnosis not present

## 2022-09-07 DIAGNOSIS — R2689 Other abnormalities of gait and mobility: Secondary | ICD-10-CM | POA: Diagnosis not present

## 2022-09-07 DIAGNOSIS — G20C Parkinsonism, unspecified: Secondary | ICD-10-CM | POA: Diagnosis not present

## 2022-09-11 DIAGNOSIS — R2689 Other abnormalities of gait and mobility: Secondary | ICD-10-CM | POA: Diagnosis not present

## 2022-09-11 DIAGNOSIS — M6281 Muscle weakness (generalized): Secondary | ICD-10-CM | POA: Diagnosis not present

## 2022-09-11 DIAGNOSIS — R2681 Unsteadiness on feet: Secondary | ICD-10-CM | POA: Diagnosis not present

## 2022-09-11 DIAGNOSIS — G20C Parkinsonism, unspecified: Secondary | ICD-10-CM | POA: Diagnosis not present

## 2022-09-13 ENCOUNTER — Encounter: Payer: Self-pay | Admitting: Psychiatry

## 2022-09-13 ENCOUNTER — Ambulatory Visit (INDEPENDENT_AMBULATORY_CARE_PROVIDER_SITE_OTHER): Payer: Medicare Other | Admitting: Psychiatry

## 2022-09-13 DIAGNOSIS — Z79899 Other long term (current) drug therapy: Secondary | ICD-10-CM

## 2022-09-13 DIAGNOSIS — G251 Drug-induced tremor: Secondary | ICD-10-CM | POA: Diagnosis not present

## 2022-09-13 DIAGNOSIS — E538 Deficiency of other specified B group vitamins: Secondary | ICD-10-CM

## 2022-09-13 DIAGNOSIS — F3342 Major depressive disorder, recurrent, in full remission: Secondary | ICD-10-CM

## 2022-09-13 DIAGNOSIS — F5105 Insomnia due to other mental disorder: Secondary | ICD-10-CM | POA: Diagnosis not present

## 2022-09-13 NOTE — Progress Notes (Signed)
Chase Mathis [id number] 03-Feb-1934 89 y.o.  Subjective:   Patient ID:  Chase Mathis is a 89 y.o. (DOB 1933-07-08) male.  Chief Complaint:  Chief Complaint  Patient presents with   Follow-up   Depression   Sleeping Problem    Depression        Associated symptoms include fatigue.  Associated symptoms include no decreased concentration and no suicidal ideas.  Chase Mathis presents to the office today for follow-up of recurrence of depression that occurred at the end of 2019.  He responded to an increase in lithium from 300 to 450 mg daily..  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  He had an early relapse while on nortriptyline alone and lithium was added and he had booster ECTs and has been in remission since that time.  seen in November 2019.  His depression had gone into remission again with increase in lithium from 300 to 450 mg daily.  No med changes were made at that time.d  02/2019 appt : Continue lithium 150 mg capsules 3 daily Continue nortriptyline 50 mg capsules 2 nightly  11/30/20 lab and TC ;His nortriptyline level is 303 and should be 150 or lower.  He is on 2 of the 50 mg capsules.  Tell him to cut the dose back to one of the 50 mg capsules.  He has an appointment in August.  Let them know if he has any problem with the dosage reduction and to let us know.  02/10/2021 appointment following noted: No problems with less nortriptyline to 50 mg nightly. Fine until 2-3 mos ago.  Was working in back yard and got weak and fell and couldn't get up.  Hasn't happened again. Saw neuro Dr. Carles Collet. No PD and probably lithium tremor.  Equivical DaTscan. Had neuropsych testing and dx with MCI. No further episodes of this.   I'm fine now.  Remained in remission.  Patient reports stable mood and denies depressed or irritable moods.  Patient denies any recent difficulty with anxiety.  Patient denies difficulty  with sleep initiation but occ maintenance with work dreams with anxiety.  Benadryl helps. Denies appetite disturbance.  Patient reports that energy and motivation have been good.  Patient denies any difficulty with concentration.  Patient denies any suicidal ideation. Minimal tremor.  04/25/2021 appointment with the following noted: Did not get lithium level and labs as requested. [place].  Didn't think it would be a problem.  Small place.  Younger than others.He doesn't like it bc of the change similar to what he had after moving to [place] years ago leading to a severe prolonged depression.  Feels he made a mistake but trying to ease up on his thinking bc he realizes it's not all about him. Dispersed his furniture.  Thinks he's adjusting better.   Once weekly goes to [employer] mixed [employer].  Involved in other things.   Plan: Continue lithium 150 mg capsules 3 daily Continue nortriptyline 50 mg capsules 1 nightly Check lithium level  09/12/2021 phone call wanting to be worked in sooner due to anxiety.  09/15/2021 appointment with the following noted: seen with wife Chase Mathis Nortriptyline level ordered February 10, 2021 never completed Moved to [facility] several months ago and having trouble adjusting despite 6 years. Can't sleep and trouble with anxiety.  Trouble adjusting.  Can't shut his mind off.   Most stressful  is per wife it's crowded and small space.  She feels the same way about it.  She also feels it is a small space also.   She is struggling with some depression about it. Too. Living at [place].   He needs an office and a place to think and has an office. Usually to gym several times a week and walking with wife a couple of times per week.   Feels a need to make the place work.   Did this mainly to please the kids.   Consistent with nortriptyline 50 mg HS and lithium 450 but wife says she thinks he misses some of it. No SE. Mainly initial  insomnia.  2-3  hours nightly.Naps an hour and she thinks he might nap a little more. Active at church and in choir.  [place]. Gradually harder to do things.   Plan: Continue lithium 150 mg capsules 3 daily Continue nortriptyline 50 mg capsules 1 nightly Check lithium and nortriptyline levels. Needs something for sleep.  Lorazepam 0.5-1.0  mg HS  09/16/2021 lab note: Patient has relapsed with depression and anxiety and this is related to insufficient blood levels of his meds due to noncompliance: His nortriptyline level is less than 20 which means it is not detectable with a prescribed dose of 50 mg nightly.  In June 2020 his nortriptyline level was 303 on 100 mg daily and we reduced it to 50 mg daily.  Optimal levels are 50 to 150 ng/ml  This is highly suggestive that he is not taking nortriptyline on a regular basis.  He is missing a number of dosages.  At his last appointment his wife indicated she felt he was missing significant dosages.   His lithium level was also low and that was recently increased from 3 of the 150 mg capsules to 4 of the 150 mg capsules.  I think it was low because he was missing dosages of that as well.  He needs to use a pillbox to improve compliance.  He will not get better if he does not take his medicine. Tell him to increase nortriptyline to 75 mg at night.  I will send in prescription for 3 of the 25 mg capsules. Continue lithium 600 mg nightly.  Repeat his blood levels after about a week. I sent in a orders to [facility]. He can use the lorazepam 0.5 mg 1 twice daily for anxiety and 1-2 at night for sleep.  But this will not solve his symptoms until he is more compliant with nortriptyline and lithium as indicated in the other note sent today.  10/05/21 lab noted: On nortriptyline 75 mg nightly level was still low at 49.  Therefore nortriptyline increased to 100 mg nightly.  He needs to be very compliant with this and the lithium level which  had also been low.  10/14/2021 appointment with the following noted:  with wife Chase Mathis I'm better.  Rx lorazepam helped improve sleep taking it BID Disc noncompliance which led to relapse. Wife and D in law are making sure he is taking the meds. But not using the pill box. Admits he was neglecting himself and doesn't know why that was necessary.   Feels much better.  Goes to gym and wants to do it again and didn't want to do it when depressed.   Sleep and less anxious.  More interest in things.  More enjoyment. Increased tremor and dropping some pills Appetite had gone down over a couple  of mos and lost 20# but appetite has returned. Wife notices he is having some mild inattentiveness and forgetfulness.  11/29/21 appt noted: Tolerated in crease meds. Ativan helped sleep and insomnia triggered the problems . Lihtium tremor is mild Sing in choir. More consistent with meds and no mistakes. Out of the depression and anxiety Plan: Continue lithium 150 mg capsules 4 daily Continue nortriptyline 75 mg capsules 1 nightly Check lithium and nortriptyline levels again.   06/14/22 appt noted: Normal B12 in March 2023 Dr. Carles Collet dx periperal neuropathy and likely PD and also ikely lithium tremor.  Sinemet hasn't helped tremor. Not a severe tremor but is annoying. Mood is good and Chase Mathis agrees .  Not that anxious and hasn't needed lorazepam.   Sleep is good.   Consistent. [place] resident. Plan: much better again with consistency of : Continue lithium 150 mg capsules 4 daily Continue nortriptyline 75 mg capsules 1 nightly Check lithium and nortriptyline levels again.  [facility].  Discussed the risk of toxicity due to recent unstable blood levels.  Will try to reduce if possible.  08/03/22 TC wife:  Chase Mathis, wife, asks if he can come off of the lorazepam? He is too sleepy and sleeping way too much. Gets up early and is going back to bed at 8 or 9 and sleeping a few hours again.     MD resp:  I thought I sent a response to this but tell her or him to cut the dose by 50% for 2 weeks and see how he does.  If he's slepeing ok then stop it.     09/13/22 appt noted; "I'm fine".  Still some tremor.  No recent falls.  Exercising.   Seeing Dr. Carles Collet.  Concerns about possible PD and has peripheral neuropathy.  Concerns about this but not overly worried.  Sleeping ok.  No problems off lorazpem. Attends  [employer].    Past Psychiatric Medication Trials:  ECT Paroxetine 60, duloxetine anxiety, sertraline 3 weeks, Lexapro 10 x 30 days, Effexor 225 Abilify 5 no response Quetiapine Geodon Adderall trazodone November 2018, we reduced the nortriptyline from 150 mg to 100 mg daily and reduced the lithium from 450 mg a day to 300 mg daily because of some tremor and because he wanted to try to reduce the medication.  His blood levels on the higher dosages were lithium 0.7 nortriptyline 103.   On the lower dosages the level in October were:  Li 0.4.  Nortriptyline 224.  Review of Systems:  Review of Systems  Constitutional:  Positive for fatigue.  Cardiovascular:  Negative for palpitations.  Gastrointestinal:  Negative for diarrhea.  Musculoskeletal:  Positive for gait problem.  Neurological:  Positive for tremors. Negative for dizziness and weakness.  Psychiatric/Behavioral:  Negative for agitation, behavioral problems, confusion, decreased concentration, dysphoric mood, hallucinations, self-injury, sleep disturbance and suicidal ideas. The patient is not nervous/anxious and is not hyperactive.     Medications: I have reviewed the patient's current medications.  Current Outpatient Medications  Medication Sig Dispense Refill   amLODipine (NORVASC) 5 MG tablet Take 5 mg by mouth daily.     carbidopa-levodopa (SINEMET IR) 25-100 MG tablet Take 1 tablet by mouth 3 (three) times daily. 6am/11am/4pm 270 tablet 1   finasteride (PROSCAR) 5 MG tablet Take 1 tablet (5 mg  total) by mouth daily. 90 tablet 3   lisinopril (PRINIVIL,ZESTRIL) 10 MG tablet Take 10 mg by mouth daily.     lithium  carbonate 150 MG capsule TAKE 4 CAPSULES (600 MG TOTAL) BY MOUTH AT BEDTIME. 360 capsule 0   nortriptyline (PAMELOR) 25 MG capsule TAKE 3 CAPSULES (75 MG TOTAL) BY MOUTH AT BEDTIME. 270 capsule 0   vitamin B-12 (CYANOCOBALAMIN) 50 MCG tablet Take 50 mcg by mouth daily.     No current facility-administered medications for this visit.    Medication Side Effects: None  Allergies: No Known Allergies  Past Medical History:  Diagnosis Date   Benign non-nodular prostatic hyperplasia with lower urinary tract symptoms 12/21/2014   Chronic sinusitis 08/23/2017   Combined forms of age-related cataract of both eyes 06/15/2016   Elevated prostate specific antigen (PSA) 07/03/2011   Epiretinal membrane, bilateral 06/03/2015   Epistaxis 08/16/2017   History of electroconvulsive therapy    Hypertension    Left ureteral stone 01/28/2015   Lithium-induced tremor    Major depressive disorder in remission    MCI (mild cognitive impairment) with memory loss 01/05/2021   Peripheral neuropathy    Recurrent nephrolithiasis 09/02/2017   Vitreous syneresis of both eyes 06/03/2015    Family History  Problem Relation Age of Onset   Lung cancer Mother    Heart disease Father    Pancreatic cancer Sister    Healthy Son    Healthy Daughter     Social History   Socioeconomic History   Marital status: Married    Spouse name: Not on file   Number of children: Not on file   Years of education: 48   Highest education level: Professional school degree (e.g., MD, DDS, DVM, JD)  Occupational History   Occupation: retired    Comment: urologist  Tobacco Use   Smoking status: Never   Smokeless tobacco: Never  Substance and Sexual Activity   Alcohol use: Yes    Comment: very rare   Drug use: No   Sexual activity: Yes  Other Topics Concern   Not on file  Social History Narrative    Right handed   Drinks caffeine    Two story home   Social Determinants of Health   Financial Resource Strain: Not on file  Food Insecurity: Not on file  Transportation Needs: Not on file  Physical Activity: Not on file  Stress: Not on file  Social Connections: Not on file  Intimate Partner Violence: Not on file    Past Medical History, Surgical history, Social history, and Family history were reviewed and updated as appropriate.   Please see review of systems for further details on the patient's review from today.   Objective:   Physical Exam:  There were no vitals taken for this visit.  Physical Exam Constitutional:      General: He is not in acute distress.    Appearance: He is well-developed.  Musculoskeletal:        General: No deformity.  Neurological:     Mental Status: He is alert and oriented to person, place, and time.     Motor: No tremor.     Coordination: Coordination normal.     Gait: Gait normal.  Psychiatric:        Attention and Perception: Attention and perception normal.        Mood and Affect: Mood is not anxious or depressed. Affect is not labile, blunt, angry or inappropriate.        Speech: Speech normal.        Behavior: Behavior normal.        Thought Content: Thought content normal. Thought  content is not delusional. Thought content does not include homicidal or suicidal ideation. Thought content does not include suicidal plan.        Cognition and Memory: Cognition normal.        Judgment: Judgment normal.     Comments: Insight intact. No auditory or visual hallucinations. No delusions.  Depression resolved upbeat     Lab Review:     Component Value Date/Time   NA 143 06/22/2022 0849   K 4.6 06/22/2022 0849   CL 104 06/22/2022 0849   CO2 22 06/22/2022 0849   GLUCOSE 144 (H) 06/22/2022 0849   GLUCOSE 81 04/18/2018 0937   BUN 19 06/22/2022 0849   CREATININE 1.15 06/22/2022 0849   CREATININE 1.18 (H) 04/18/2018 0937   CALCIUM 9.5  06/22/2022 0849   PROT 7.5 12/19/2016 2211   ALBUMIN 4.4 12/19/2016 2211   AST 29 12/19/2016 2211   ALT 42 12/19/2016 2211   ALKPHOS 54 12/19/2016 2211   BILITOT 0.7 12/19/2016 2211   GFRNONAA 59 (L) 03/25/2019 1101   GFRAA 68 03/25/2019 1101       Component Value Date/Time   WBC 12.5 (H) 12/19/2016 2211   RBC 5.19 12/19/2016 2211   HGB 15.6 12/19/2016 2211   HCT 46.2 12/19/2016 2211   PLT 265 12/19/2016 2211   MCV 89.0 12/19/2016 2211   MCH 30.1 12/19/2016 2211   MCHC 33.8 12/19/2016 2211   RDW 14.1 12/19/2016 2211   LYMPHSABS 1.3 12/19/2016 2211   MONOABS 0.9 12/19/2016 2211   EOSABS 0.1 12/19/2016 2211   BASOSABS 0.0 12/19/2016 2211    Lithium Lvl  Date Value Ref Range Status  06/22/2022 0.6 0.5 - 1.2 mmol/L Final    Comment:    A concentration of 0.5-0.8 mmol/L is advised for long-term use; concentrations of up to 1.2 mmol/L may be necessary during acute treatment.                                  Detection Limit = 0.1                           <0.1 indicates None Detected   06/22/22 nortrip level 32 06/22/22 lithium level 0.6 on 600 mg daily.  11/30/20 lab and TC ;His nortriptyline level is 303 and should be 150 or lower.  He is on 2 of the 50 mg capsules.  05/03/2021 lithium level 0.8 on 450 mg nightly  Normal B12 in March 2023  No results found for: "PHENYTOIN", "PHENOBARB", "VALPROATE", "CBMZ"   .res Assessment: Plan:    Recurrent major depression in full remission ([place])  Insomnia due to mental condition  Lithium use  Lithium-induced tremor  Low serum vitamin B12   Greater than 50% of 30 min face to face time with patient was spent on counseling and coordination of care.  March 2023 relapse of depression.  It was discovered by blood levels as well as by history that there was some inconsistency with compliance with both nortriptyline and lithium.  Lithium dosage has been increased and nortriptyline level has been increased.  Much better,  depression resolved, with lithium and nortriptyline now that he's compliant again. We have to be consistent lithium levels of 0.6 on 600 mg daily. Nortriptyline level on 75 mg nightly    He is having some side effects with the lithium including tremor and dropping things. Seeing  Dr. Carles Collet for tremor.  He is addressing these issues.  Discussed side effects of both these medications. Counseled patient regarding potential benefits, risks, and side effects of lithium to include potential risk of lithium affecting thyroid and renal function.  Discussed need for periodic lab monitoring to determine drug level and to assess for potential adverse effects.  Counseled patient regarding signs and symptoms of lithium toxicity and advised that they notify office immediately or seek urgent medical attention if experiencing these signs and symptoms.  Patient advised to contact office with any questions or concerns.  Continue lithium 150 mg capsules 4 daily Continue nortriptyline 75 mg capsules 1 nightly 06/22/22 nortrip level 32 06/22/22 lithium level 0.6 on 600 mg daily.  Cannot reduce dosages without relapse.  Sleep ok off lorazepam  Discussed side effects of nortriptyline which could include anticholinergic side effects because of his age could include symptoms like confusion.  He has no such side effects at this time.  Contact us if you have the symptoms or any other symptoms such as sedation etc.  B12 level and need for supplements.  He had stopped. History low B12.  Was OK.  Continues gym and church Discussed relapse prevention techniques.  He had a recent bout of depression after downsizing to [place].   Follow-up 6 mos .  Lynder Parents MD, DFAPA  Please see After Visit Summary for patient specific instructions.  Future Appointments  Date Time Provider [place]  10/05/2022  8:45 AM Tat, Eustace Quail, DO [id number] None    No orders of the defined types were placed in this  encounter.     -------------------------------

## 2022-09-15 DIAGNOSIS — G20C Parkinsonism, unspecified: Secondary | ICD-10-CM | POA: Diagnosis not present

## 2022-09-15 DIAGNOSIS — M6281 Muscle weakness (generalized): Secondary | ICD-10-CM | POA: Diagnosis not present

## 2022-09-15 DIAGNOSIS — R2681 Unsteadiness on feet: Secondary | ICD-10-CM | POA: Diagnosis not present

## 2022-09-15 DIAGNOSIS — R2689 Other abnormalities of gait and mobility: Secondary | ICD-10-CM | POA: Diagnosis not present

## 2022-09-18 DIAGNOSIS — G20C Parkinsonism, unspecified: Secondary | ICD-10-CM | POA: Diagnosis not present

## 2022-09-18 DIAGNOSIS — M6281 Muscle weakness (generalized): Secondary | ICD-10-CM | POA: Diagnosis not present

## 2022-09-18 DIAGNOSIS — R2681 Unsteadiness on feet: Secondary | ICD-10-CM | POA: Diagnosis not present

## 2022-09-18 DIAGNOSIS — R2689 Other abnormalities of gait and mobility: Secondary | ICD-10-CM | POA: Diagnosis not present

## 2022-09-19 DIAGNOSIS — M6281 Muscle weakness (generalized): Secondary | ICD-10-CM | POA: Diagnosis not present

## 2022-09-19 DIAGNOSIS — R2681 Unsteadiness on feet: Secondary | ICD-10-CM | POA: Diagnosis not present

## 2022-09-19 DIAGNOSIS — R2689 Other abnormalities of gait and mobility: Secondary | ICD-10-CM | POA: Diagnosis not present

## 2022-09-19 DIAGNOSIS — G20C Parkinsonism, unspecified: Secondary | ICD-10-CM | POA: Diagnosis not present

## 2022-09-21 DIAGNOSIS — G20C Parkinsonism, unspecified: Secondary | ICD-10-CM | POA: Diagnosis not present

## 2022-09-21 DIAGNOSIS — R2681 Unsteadiness on feet: Secondary | ICD-10-CM | POA: Diagnosis not present

## 2022-09-21 DIAGNOSIS — R2689 Other abnormalities of gait and mobility: Secondary | ICD-10-CM | POA: Diagnosis not present

## 2022-09-21 DIAGNOSIS — M6281 Muscle weakness (generalized): Secondary | ICD-10-CM | POA: Diagnosis not present

## 2022-09-25 DIAGNOSIS — R2689 Other abnormalities of gait and mobility: Secondary | ICD-10-CM | POA: Diagnosis not present

## 2022-09-25 DIAGNOSIS — G20C Parkinsonism, unspecified: Secondary | ICD-10-CM | POA: Diagnosis not present

## 2022-09-25 DIAGNOSIS — R2681 Unsteadiness on feet: Secondary | ICD-10-CM | POA: Diagnosis not present

## 2022-09-25 DIAGNOSIS — M6281 Muscle weakness (generalized): Secondary | ICD-10-CM | POA: Diagnosis not present

## 2022-09-26 DIAGNOSIS — G20C Parkinsonism, unspecified: Secondary | ICD-10-CM | POA: Diagnosis not present

## 2022-09-26 DIAGNOSIS — R2681 Unsteadiness on feet: Secondary | ICD-10-CM | POA: Diagnosis not present

## 2022-09-26 DIAGNOSIS — M6281 Muscle weakness (generalized): Secondary | ICD-10-CM | POA: Diagnosis not present

## 2022-09-26 DIAGNOSIS — R2689 Other abnormalities of gait and mobility: Secondary | ICD-10-CM | POA: Diagnosis not present

## 2022-09-29 DIAGNOSIS — M6281 Muscle weakness (generalized): Secondary | ICD-10-CM | POA: Diagnosis not present

## 2022-09-29 DIAGNOSIS — G20C Parkinsonism, unspecified: Secondary | ICD-10-CM | POA: Diagnosis not present

## 2022-09-29 DIAGNOSIS — R2689 Other abnormalities of gait and mobility: Secondary | ICD-10-CM | POA: Diagnosis not present

## 2022-09-29 DIAGNOSIS — R2681 Unsteadiness on feet: Secondary | ICD-10-CM | POA: Diagnosis not present

## 2022-09-30 ENCOUNTER — Other Ambulatory Visit: Payer: Self-pay | Admitting: Psychiatry

## 2022-09-30 DIAGNOSIS — F3342 Major depressive disorder, recurrent, in full remission: Secondary | ICD-10-CM

## 2022-10-03 NOTE — Progress Notes (Unsigned)
Assessment/Plan:   1.  Gait instabity  -Peripheral neuropathy likely contributes.  He is on a B12 supplement.             -Patient is also on nortriptyline, which is an anticholinergic and certainly can contribute to gait instability, although he is not on a large dose and did worse with his depression when he was off of it.  -Skin biopsy negative for alpha-synuclein in August, 2023  -skin bx was pos for PN which could produce gait instability  -DaTscan did demonstrate decreased uptake in the left caudate more than the right and mild decreased activity in the left posterior stratum.  -Patient's case is very complex, partially because the presence of tremor inducing agents, and because of the fact that his skin biopsy has been negative and DaTscan has been marginally positive.    -They are not sure that levodopa has been beneficial, but they are also not sure that medication has been taken faithfully.  They seem to disagree amongst themselves about this.  Ultimately, they want to stay on the medication.  I asked them to videotape any types of abnormal movements, because it was unclear to me if he was having tremor or dyskinesia, but it really sounded more like tremor.  -in PT currently.  Would recommend walker at all times.  We had a long discussion about this.  Information was given on the use step, but I told him that a regular walker with 2 wheels and 2 tennis balls would be fine.  -I am going to go ahead and proceed with MRI brain.  He is dragging the right leg more than he was.   2.  B12 deficiency             -Patient now on supplementation.   3.  Tremor             -This is likely due to the lithium, and we discussed that again today.  He has both intention tremor (likely from lithium), but he has intermittent rest tremor as well (I have seen this on both sides independently)..  Studies are varied, but incidate that up to 2/3 of patients on lithium will have lithium-induced tremor, even if  the patients are not toxic on the medication.  This is just a common side effect of patients on lithium.  Patient cannot get off of this medication.   4.  Memory change  -Neurocognitive testing done January 04, 2021 with evidence of MCI only.  Felt that this could be due to medication (nortriptyline, lithium).  This may have progressed with time.  Subjective:   Chase Mathis was seen today in neurologic follow-up.  Records reviewed since last visit.  Pt with wife who supplements hx. they called last week to state that he was having tremors again that were significant.  We called them back, the patient states that they were not tremors but "dyskinesia."  Patient stated that the symptoms were going on for about 2 weeks.  He had his lithium level was recently checked.  Lithium was checked at the end of December and December 28 and was 0.6.  we started him on carbidopa/levodopa 25/100 in early November.  He got up to the full dose in the beginning of December.  He didn't seem to get better with levodopa but wasn't worse either.  He describes to me over the last few weeks increased tremor in the R leg and "convulsions" in the legs. They state that  the movements had a "rhythm" to it, "like a tremor."  Wife states that he gets an "F for failure for taking pills."  Wife states that he was taking his pills "most" of the time when this was going on but not all of the time.  She does state, however, that she keeps finding pills that she directly gave him and they "somehow just found themselves out of his hand."    ALLERGIES:  No Known Allergies  CURRENT MEDICATIONS:  Outpatient Encounter Medications as of 10/05/2022  Medication Sig   amLODipine (NORVASC) 5 MG tablet Take 5 mg by mouth daily.   carbidopa-levodopa (SINEMET IR) 25-100 MG tablet Take 1 tablet by mouth 3 (three) times daily. 6am/11am/4pm   finasteride (PROSCAR) 5 MG tablet Take 1 tablet (5 mg total) by mouth daily.   lisinopril (PRINIVIL,ZESTRIL)  10 MG tablet Take 10 mg by mouth daily.   lithium carbonate 150 MG capsule TAKE 4 CAPSULES (600 MG TOTAL) BY MOUTH AT BEDTIME.   nortriptyline (PAMELOR) 25 MG capsule TAKE 3 CAPSULES BY MOUTH AT BEDTIME   vitamin B-12 (CYANOCOBALAMIN) 50 MCG tablet Take 50 mcg by mouth daily.   No facility-administered encounter medications on file as of 10/05/2022.    Objective:   PHYSICAL EXAMINATION:    VITALS:   There were no vitals filed for this visit.  GEN:  Normal appears male in no acute distress.  Appears younger than stated age. HEENT:  Normocephalic, atraumatic. The mucous membranes are moist.  Cardiovascular: Regular rate rhythm Lungs: Clear to auscultation bilaterally  NEUROLOGICAL: Orientation: The patient is alert and oriented x3.  Cranial nerves: There is good facial symmetry.  Extraocular muscles are intact. The visual fields are full to confrontational testing. The speech is fluent and clear. Soft palate rises symmetrically and there is no tongue deviation. Hearing is intact to conversational tone. Sensation: Sensation is intact to light touch throughout. Motor: Strength is 5/5 in the bilateral upper and lower extremities.   Shoulder shrug is equal and symmetric.  There is no pronator drift.    Movement examination: Tone: There is slight increased tone in the bilateral upper extremities Abnormal movements: There is RUE rest tremor Coordination:  There is mild decremation on the right. Gait and Station: The patient pushes off to arise.  He is unsteady.  He drags the R leg with ambulation.   Chemistry      Component Value Date/Time   NA 143 06/22/2022 0849   K 4.6 06/22/2022 0849   CL 104 06/22/2022 0849   CO2 22 06/22/2022 0849   BUN 19 06/22/2022 0849   CREATININE 1.15 06/22/2022 0849   CREATININE 1.18 (H) 04/18/2018 0937      Component Value Date/Time   CALCIUM 9.5 06/22/2022 0849   ALKPHOS 54 12/19/2016 2211   AST 29 12/19/2016 2211   ALT 42 12/19/2016 2211    BILITOT 0.7 12/19/2016 2211     Lab Results  Component Value Date   VITAMINB12 >2000 (H) 09/16/2021      Total time spent on today's visit was *** minutes, including both face-to-face time and nonface-to-face time.  Time included that spent on review of records (prior notes available to me/labs/imaging if pertinent), discussing treatment and goals, answering patient's questions and coordinating care.   Cc:  Merlene Laughter, MD

## 2022-10-05 ENCOUNTER — Encounter: Payer: Self-pay | Admitting: Neurology

## 2022-10-05 ENCOUNTER — Ambulatory Visit (INDEPENDENT_AMBULATORY_CARE_PROVIDER_SITE_OTHER): Payer: Medicare Other | Admitting: Neurology

## 2022-10-05 VITALS — BP 130/58 | HR 69 | Resp 20 | Ht 68.0 in | Wt 210.0 lb

## 2022-10-05 DIAGNOSIS — G20C Parkinsonism, unspecified: Secondary | ICD-10-CM | POA: Diagnosis not present

## 2022-10-05 DIAGNOSIS — R2681 Unsteadiness on feet: Secondary | ICD-10-CM | POA: Diagnosis not present

## 2022-10-05 DIAGNOSIS — R2689 Other abnormalities of gait and mobility: Secondary | ICD-10-CM | POA: Diagnosis not present

## 2022-10-05 DIAGNOSIS — M6281 Muscle weakness (generalized): Secondary | ICD-10-CM | POA: Diagnosis not present

## 2022-10-05 NOTE — Patient Instructions (Signed)
It was good to see you!  The physicians and staff at [facility] are committed to providing excellent care. You may receive a survey requesting feedback about your experience at our office. We strive to receive "very good" responses to the survey questions. If you feel that your experience would prevent you from giving the office a "very good " response, please contact our office to try to remedy the situation. We may be reached at [phone]. Thank you for taking the time out of your busy day to complete the survey.  

## 2022-10-06 ENCOUNTER — Other Ambulatory Visit: Payer: Self-pay | Admitting: Neurology

## 2022-10-06 ENCOUNTER — Other Ambulatory Visit: Payer: Self-pay | Admitting: Psychiatry

## 2022-10-06 DIAGNOSIS — M6281 Muscle weakness (generalized): Secondary | ICD-10-CM | POA: Diagnosis not present

## 2022-10-06 DIAGNOSIS — G20C Parkinsonism, unspecified: Secondary | ICD-10-CM | POA: Diagnosis not present

## 2022-10-06 DIAGNOSIS — F3342 Major depressive disorder, recurrent, in full remission: Secondary | ICD-10-CM

## 2022-10-06 DIAGNOSIS — R2689 Other abnormalities of gait and mobility: Secondary | ICD-10-CM | POA: Diagnosis not present

## 2022-10-06 DIAGNOSIS — R2681 Unsteadiness on feet: Secondary | ICD-10-CM | POA: Diagnosis not present

## 2022-10-11 DIAGNOSIS — R2681 Unsteadiness on feet: Secondary | ICD-10-CM | POA: Diagnosis not present

## 2022-10-11 DIAGNOSIS — R2689 Other abnormalities of gait and mobility: Secondary | ICD-10-CM | POA: Diagnosis not present

## 2022-10-11 DIAGNOSIS — G20C Parkinsonism, unspecified: Secondary | ICD-10-CM | POA: Diagnosis not present

## 2022-10-11 DIAGNOSIS — M6281 Muscle weakness (generalized): Secondary | ICD-10-CM | POA: Diagnosis not present

## 2022-10-12 DIAGNOSIS — R2681 Unsteadiness on feet: Secondary | ICD-10-CM | POA: Diagnosis not present

## 2022-10-12 DIAGNOSIS — R2689 Other abnormalities of gait and mobility: Secondary | ICD-10-CM | POA: Diagnosis not present

## 2022-10-12 DIAGNOSIS — G20C Parkinsonism, unspecified: Secondary | ICD-10-CM | POA: Diagnosis not present

## 2022-10-12 DIAGNOSIS — M6281 Muscle weakness (generalized): Secondary | ICD-10-CM | POA: Diagnosis not present

## 2022-10-19 DIAGNOSIS — G20C Parkinsonism, unspecified: Secondary | ICD-10-CM | POA: Diagnosis not present

## 2022-10-19 DIAGNOSIS — M6281 Muscle weakness (generalized): Secondary | ICD-10-CM | POA: Diagnosis not present

## 2022-10-19 DIAGNOSIS — R2689 Other abnormalities of gait and mobility: Secondary | ICD-10-CM | POA: Diagnosis not present

## 2022-10-19 DIAGNOSIS — R2681 Unsteadiness on feet: Secondary | ICD-10-CM | POA: Diagnosis not present

## 2022-10-20 DIAGNOSIS — M6281 Muscle weakness (generalized): Secondary | ICD-10-CM | POA: Diagnosis not present

## 2022-10-20 DIAGNOSIS — R2681 Unsteadiness on feet: Secondary | ICD-10-CM | POA: Diagnosis not present

## 2022-10-20 DIAGNOSIS — G20C Parkinsonism, unspecified: Secondary | ICD-10-CM | POA: Diagnosis not present

## 2022-10-20 DIAGNOSIS — R2689 Other abnormalities of gait and mobility: Secondary | ICD-10-CM | POA: Diagnosis not present

## 2022-10-25 ENCOUNTER — Other Ambulatory Visit: Payer: Self-pay | Admitting: Psychiatry

## 2022-10-25 DIAGNOSIS — F5105 Insomnia due to other mental disorder: Secondary | ICD-10-CM

## 2022-10-25 DIAGNOSIS — M25551 Pain in right hip: Secondary | ICD-10-CM | POA: Diagnosis not present

## 2022-10-26 DIAGNOSIS — G20C Parkinsonism, unspecified: Secondary | ICD-10-CM | POA: Diagnosis not present

## 2022-10-26 DIAGNOSIS — M6281 Muscle weakness (generalized): Secondary | ICD-10-CM | POA: Diagnosis not present

## 2022-10-31 DIAGNOSIS — G20C Parkinsonism, unspecified: Secondary | ICD-10-CM | POA: Diagnosis not present

## 2022-10-31 DIAGNOSIS — M6281 Muscle weakness (generalized): Secondary | ICD-10-CM | POA: Diagnosis not present

## 2022-11-04 ENCOUNTER — Other Ambulatory Visit: Payer: Self-pay | Admitting: Psychiatry

## 2022-11-04 DIAGNOSIS — F3342 Major depressive disorder, recurrent, in full remission: Secondary | ICD-10-CM

## 2022-11-06 DIAGNOSIS — M6281 Muscle weakness (generalized): Secondary | ICD-10-CM | POA: Diagnosis not present

## 2022-11-06 DIAGNOSIS — G20C Parkinsonism, unspecified: Secondary | ICD-10-CM | POA: Diagnosis not present

## 2022-11-27 ENCOUNTER — Other Ambulatory Visit: Payer: Self-pay | Admitting: Psychiatry

## 2022-11-27 DIAGNOSIS — F5105 Insomnia due to other mental disorder: Secondary | ICD-10-CM

## 2022-12-10 IMAGING — NM NM DATSCAN
2 series · 12 of 12 positions shown · non-contrast
Comparison: None

CLINICAL DATA: 87-year-old male with hand tremors.

EXAM:
NUCLEAR MEDICINE BRAIN IMAGING WITH SPECT  (DaTscan )
TECHNIQUE: SPECT images of the brain were obtained after intravenous injection
of radiopharmaceutical. 4 hour post injection imaging. Appropriate
positioning. 130 mg i-STAT given orally for thyroid blockade.
RADIOPHARMACEUTICALS:  4.8 millicuries I 123 Ioflupane

[Series 1: brain spect · 4.14mm/px · 6 of 120 frames shown]
[frame 11/120  full-range]
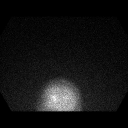
[frame 31/120  full-range]
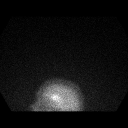
[frame 51/120  full-range]
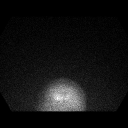
[frame 71/120  full-range]
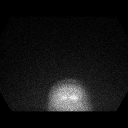
[frame 91/120  full-range]
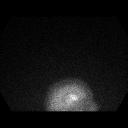
[frame 111/120  full-range]
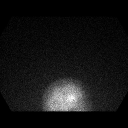

[Series 7: datquant results · 4.1mm · 4.14mm/px · 6 of 128 frames shown]
[frame 11/128]
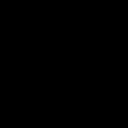
[frame 32/128]
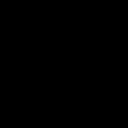
[frame 54/128]
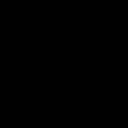
[frame 75/128]
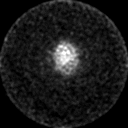
[frame 96/128]
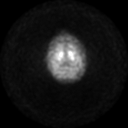
[frame 118/128]
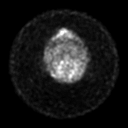

[12 of 12 positions shown; findings below may reference images not displayed]

FINDINGS: Asymmetric activity in the heads of the caudate nucleus with the
head of the LEFT caudate nucleus clearly more intense than the head
of the RIGHT caudate nucleus. The RIGHT caudate nucleus is normal
shaped; however, there is decreased activity within the posterior
striatum.

Mild decreased activity in the LEFT posterior striatum additionally
IMPRESSION: Asymmetric radiotracer activity in the striata as described above.
Findings equivocal but could indicate Parkinsonian syndrome
pathology.

Of note, DaTSCAN is not diagnostic of Parkinsonian syndromes, which
remains a clinical diagnosis. DaTscan is an adjuvant test to aid in
the clinical diagnosis of Parkinsonian syndromes.

## 2022-12-18 ENCOUNTER — Telehealth: Payer: Self-pay | Admitting: Psychiatry

## 2022-12-18 NOTE — Telephone Encounter (Signed)
Pt lvm that he needs a refill on his lorazapam 0.5mg . He said that the pharmacy has tried several times to contact us for this refill. Please send it in and call him at [phone]

## 2022-12-18 NOTE — Telephone Encounter (Signed)
Last filled 2/14. Reported March visit that he was not taking lorazepam. Refused RF requests based on this.   LVM for patient.

## 2022-12-19 ENCOUNTER — Telehealth: Payer: Self-pay | Admitting: Psychiatry

## 2022-12-19 ENCOUNTER — Other Ambulatory Visit: Payer: Self-pay

## 2022-12-19 MED ORDER — LORAZEPAM 0.5 MG PO TABS: 0.5000 mg | ORAL_TABLET | Freq: Every evening | ORAL | 0 refills | Status: DC | PRN

## 2022-12-19 NOTE — Telephone Encounter (Signed)
Dr. Sherlean Foot called at 2:54.  He has asked to be put on the CXL.  He is having some trouble and needs to get in to see Dr. Jennelle Human.  Next appt is 8/21.  Please call to discuss issues.

## 2022-12-19 NOTE — Telephone Encounter (Signed)
Patient reporting that his depression just popped up. He rates it as 4/10. No new stressors. Is experiencing intermittent sleep issues. Taking lithium and nortriptyline as prescribed. He did ask for lorazepam and Rx pended.

## 2022-12-19 NOTE — Telephone Encounter (Signed)
Pended.

## 2022-12-19 NOTE — Telephone Encounter (Signed)
Left second VM to RC.  

## 2022-12-20 ENCOUNTER — Other Ambulatory Visit: Payer: Self-pay | Admitting: Psychiatry

## 2022-12-20 DIAGNOSIS — F3342 Major depressive disorder, recurrent, in full remission: Secondary | ICD-10-CM

## 2022-12-20 NOTE — Telephone Encounter (Signed)
The last time he relapsed with depression it was because his nortriptyline and lithium levels dropped too low. Go get lab tests any morning at [facility].  Once we get  nortriptyline and lithium levels then we will make a decision about what changes are needed.

## 2022-12-21 DIAGNOSIS — F3342 Major depressive disorder, recurrent, in full remission: Secondary | ICD-10-CM | POA: Diagnosis not present

## 2022-12-21 NOTE — Telephone Encounter (Signed)
Patient notified of recommendations. 

## 2022-12-22 ENCOUNTER — Encounter: Payer: Self-pay | Admitting: Psychiatry

## 2022-12-24 LAB — NORTRIPTYLINE LEVEL: Nortriptyline Lvl: 53 mcg/L (ref 50–150)

## 2022-12-24 LAB — LITHIUM LEVEL: Lithium Lvl: 1 mmol/L (ref 0.6–1.2)

## 2022-12-25 ENCOUNTER — Telehealth: Payer: Self-pay

## 2022-12-25 NOTE — Telephone Encounter (Signed)
Patient notified of recommendation. 

## 2022-12-25 NOTE — Progress Notes (Signed)
Responded elsewhere lithium level normal . Nortriptyline 53 on 75 mg .  Pt dep.  Incr nortriptyline to 100 mg hs. Consider repeating level if pt improves or not.

## 2022-12-25 NOTE — Telephone Encounter (Signed)
Nortripyline level is borderline low on 75 mg at level 53.  Easiest option is to increae to 4 of 25 mg capsules.  If gets U hesitancy reduce back and call us.  Could take 3 weeks or so to help.  Meredith Staggers, MD, DFAPA

## 2023-01-04 ENCOUNTER — Encounter: Payer: Self-pay | Admitting: Psychiatry

## 2023-01-04 ENCOUNTER — Ambulatory Visit (INDEPENDENT_AMBULATORY_CARE_PROVIDER_SITE_OTHER): Payer: Medicare Other | Admitting: Psychiatry

## 2023-01-04 DIAGNOSIS — E538 Deficiency of other specified B group vitamins: Secondary | ICD-10-CM | POA: Diagnosis not present

## 2023-01-04 DIAGNOSIS — F5105 Insomnia due to other mental disorder: Secondary | ICD-10-CM | POA: Diagnosis not present

## 2023-01-04 DIAGNOSIS — Z79899 Other long term (current) drug therapy: Secondary | ICD-10-CM | POA: Diagnosis not present

## 2023-01-04 DIAGNOSIS — F331 Major depressive disorder, recurrent, moderate: Secondary | ICD-10-CM

## 2023-01-04 DIAGNOSIS — F3342 Major depressive disorder, recurrent, in full remission: Secondary | ICD-10-CM

## 2023-01-04 DIAGNOSIS — G251 Drug-induced tremor: Secondary | ICD-10-CM

## 2023-01-04 MED ORDER — LITHIUM CARBONATE 150 MG PO CAPS: 450.0000 mg | ORAL_CAPSULE | Freq: Every evening | ORAL | 0 refills | Status: DC

## 2023-01-04 MED ORDER — NORTRIPTYLINE HCL 50 MG PO CAPS: 100.0000 mg | ORAL_CAPSULE | Freq: Every day | ORAL | 0 refills | Status: DC

## 2023-01-04 NOTE — Progress Notes (Signed)
Chase Mathis [id number] [birth year] 89 y.o.  Subjective:   Patient ID:  Chase Mathis is a 89 y.o. (DOB 1933-11-08) male.  Chief Complaint:  Chief Complaint  Patient presents with   Follow-up   Depression    Depression        Associated symptoms include fatigue.  Associated symptoms include no decreased concentration and no suicidal ideas.  Chase Mathis presents to the office today for follow-up of recurrence of depression that occurred at the end of 2019.  He responded to an increase in lithium from 300 to 450 mg daily..  Dr. Valentina Mathis had about a 4-year history of treatment resistant major depression that ultimately responded to ECT in 2009.  He had an early relapse while on nortriptyline alone and lithium was added and he had booster ECTs and has been in remission since that time.  seen in November 2019.  His depression had gone into remission again with increase in lithium from 300 to 450 mg daily.  No med changes were made at that time.d  02/2019 appt : Continue lithium 150 mg capsules 3 daily Continue nortriptyline 50 mg capsules 2 nightly  11/30/20 lab and TC ;His nortriptyline level is 303 and should be 150 or lower.  He is on 2 of the 50 mg capsules.  Tell him to cut the dose back to one of the 50 mg capsules.  He has an appointment in August.  Let them know if he has any problem with the dosage reduction and to let us know.  02/10/2021 appointment following noted: No problems with less nortriptyline to 50 mg nightly. Fine until 2-3 mos ago.  Was working in back yard and got weak and fell and couldn't get up.  Hasn't happened again. Saw neuro Dr. Arbutus Mathis. No PD and probably lithium tremor.  Equivical DaTscan. Had neuropsych testing and dx with [facility]. No further episodes of this.   I'm fine now.  Remained in remission.  Patient reports stable mood and denies depressed or irritable moods.  Patient denies any recent difficulty with anxiety.  Patient denies difficulty with sleep initiation  but occ maintenance with work dreams with anxiety.  Benadryl helps. Denies appetite disturbance.  Patient reports that energy and motivation have been good.  Patient denies any difficulty with concentration.  Patient denies any suicidal ideation. Minimal tremor.  04/25/2021 appointment with the following noted: Did not get lithium level and labs as requested. Moved [facility], [place].  Didn't think it would be a problem.  Small place.  Younger than others.He doesn't like it bc of the change similar to what he had after moving to [place] years ago leading to a severe prolonged depression.  Feels he made a mistake but trying to ease up on his thinking bc he realizes it's not all about him. Dispersed his furniture.  Thinks he's adjusting better.   Once weekly goes to men's group [employer] mixed race.  Involved in other things.   Plan: Continue lithium 150 mg capsules 3 daily Continue nortriptyline 50 mg capsules 1 nightly Check lithium level  09/12/2021 phone call wanting to be worked in sooner due to anxiety.  09/15/2021 appointment with the following noted: seen with wife Chase Mathis Nortriptyline level ordered February 10, 2021 never completed Moved to [facility] several months ago and having trouble adjusting despite 6 years. Can't sleep and trouble with anxiety.  Trouble adjusting.  Can't shut his mind off.   Most stressful is per wife it's  crowded and small space.  She feels the same way about it.  She also feels it is a small space also.   She is struggling with some depression about it. Too. Living at [facility].   He needs an office and a place to think and has an office. Usually to gym several times a week and walking with wife a couple of times per week.   Feels a need to make the place work.   Did this mainly to please the kids.   Consistent with nortriptyline 50 mg HS and lithium 450 but wife says she thinks he misses some of it. No SE. Mainly initial insomnia.  2-3  hours  nightly.Naps an hour and she thinks he might nap a little more. Active at church and in choir.  First [employer]. Gradually harder to do things.   Plan: Continue lithium 150 mg capsules 3 daily Continue nortriptyline 50 mg capsules 1 nightly Check lithium and nortriptyline levels. Needs something for sleep.  Lorazepam 0.5-1.0  mg HS  09/16/2021 lab note: Patient has relapsed with depression and anxiety and this is related to insufficient blood levels of his meds due to noncompliance: His nortriptyline level is less than 20 which means it is not detectable with a prescribed dose of 50 mg nightly.  In June 2020 his nortriptyline level was 303 on 100 mg daily and we reduced it to 50 mg daily.  Optimal levels are 50 to 150 ng/ml  This is highly suggestive that he is not taking nortriptyline on a regular basis.  He is missing a number of dosages.  At his last appointment his wife indicated she felt he was missing significant dosages.   His lithium level was also low and that was recently increased from 3 of the 150 mg capsules to 4 of the 150 mg capsules.  I think it was low because he was missing dosages of that as well.  He needs to use a pillbox to improve compliance.  He will not get better if he does not take his medicine. Tell him to increase nortriptyline to 75 mg at night.  I will send in prescription for 3 of the 25 mg capsules. Continue lithium 600 mg nightly.  Repeat his blood levels after about a week. I sent in a orders to [facility]. He can use the lorazepam 0.5 mg 1 twice daily for anxiety and 1-2 at night for sleep.  But this will not solve his symptoms until he is more compliant with nortriptyline and lithium as indicated in the other note sent today.  10/05/21 lab noted: On nortriptyline 75 mg nightly level was still low at 49.  Therefore nortriptyline increased to 100 mg nightly.  He needs to be very compliant with this and the lithium level which had also been  low.  10/14/2021 appointment with the following noted:  with wife Chase Mathis I'm better.  Rx lorazepam helped improve sleep taking it BID Disc noncompliance which led to relapse. Wife and D in law are making sure he is taking the meds. But not using the pill box. Admits he was neglecting himself and doesn't know why that was necessary.   Feels much better.  Goes to gym and wants to do it again and didn't want to do it when depressed.   Sleep and less anxious.  More interest in things.  More enjoyment. Increased tremor and dropping some pills Appetite had gone down over a couple of mos and lost  20# but appetite has returned. Wife notices he is having some mild inattentiveness and forgetfulness.  11/29/21 appt noted: Tolerated in crease meds. Ativan helped sleep and insomnia triggered the problems . Lihtium tremor is mild Sing in choir. More consistent with meds and no mistakes. Out of the depression and anxiety Plan: Continue lithium 150 mg capsules 4 daily Continue nortriptyline 75 mg capsules 1 nightly Check lithium and nortriptyline levels again.   06/14/22 appt noted: Normal B12 in March 2023 Dr. Arbutus Mathis dx periperal neuropathy and likely PD and also ikely lithium tremor.  Sinemet hasn't helped tremor. Not a severe tremor but is annoying. Mood is good and Chase Mathis agrees .  Not that anxious and hasn't needed lorazepam.   Sleep is good.   Consistent. [place] resident. Plan: much better again with consistency of : Continue lithium 150 mg capsules 4 daily Continue nortriptyline 75 mg capsules 1 nightly Check lithium and nortriptyline levels again.  [facility].  Discussed the risk of toxicity due to recent unstable blood levels.  Will try to reduce if possible.  08/03/22 TC wife:  Chase Mathis, wife, asks if he can come off of the lorazepam? He is too sleepy and sleeping way too much. Gets up early and is going back to bed at 8 or 9 and sleeping a few hours again.    MD resp:  I  thought I sent a response to this but tell her or him to cut the dose by 50% for 2 weeks and see how he does.  If he's slepeing ok then stop it.     09/13/22 appt noted; "I'm fine".  Still some tremor.  No recent falls.  Exercising.   Seeing Dr. Arbutus Mathis.  Concerns about possible PD and has peripheral neuropathy.  Concerns about this but not overly worried.  Sleeping ok.  No problems off lorazpem. Attends  [employer].    01/04/23 appt noted:89 yo and tremor more More difficulty sleeping thinking of stupid things. Has awoken thinking of surgery . Gradually getting better with dep and anxiety but did have more sx in the last couple of mos. Can't let go of bad stuff but no reason to hang on to it is part of the problem.   Past Psychiatric Medication Trials:  ECT Paroxetine 60, duloxetine anxiety, sertraline 3 weeks, Lexapro 10 x 30 days, Effexor 225 Abilify 5 no response Quetiapine Geodon Adderall trazodone November 2018, we reduced the nortriptyline from 150 mg to 100 mg daily and reduced the lithium from 450 mg a day to 300 mg daily because of some tremor and because he wanted to try to reduce the medication.  His blood levels on the higher dosages were lithium 0.7 nortriptyline 103.   On the lower dosages the level in October were:  Li 0.4.  Nortriptyline 224.  Review of Systems:  Review of Systems  Constitutional:  Positive for fatigue.  Cardiovascular:  Negative for palpitations.  Gastrointestinal:  Negative for diarrhea.  Musculoskeletal:  Positive for gait problem.  Neurological:  Positive for tremors. Negative for dizziness and weakness.  Psychiatric/Behavioral:  Negative for agitation, behavioral problems, confusion, decreased concentration, dysphoric mood, hallucinations, self-injury, sleep disturbance and suicidal ideas. The patient is not nervous/anxious and is not hyperactive.     Medications: I have reviewed the patient's current medications.  Current  Outpatient Medications  Medication Sig Dispense Refill   amLODipine (NORVASC) 5 MG tablet Take 5 mg by mouth daily.     carbidopa-levodopa (  SINEMET IR) 25-100 MG tablet TAKE 1 TABLET BY MOUTH 3 TIMES A DAY (6AM,11AM AND 4PM) 270 tablet 1   finasteride (PROSCAR) 5 MG tablet Take 1 tablet (5 mg total) by mouth daily. 90 tablet 3   lisinopril (PRINIVIL,ZESTRIL) 10 MG tablet Take 10 mg by mouth daily.     lithium carbonate 150 MG capsule TAKE 4 CAPSULES (600 MG TOTAL) BY MOUTH AT BEDTIME. 360 capsule 0   LORazepam (ATIVAN) 0.5 MG tablet Take 1 tablet (0.5 mg total) by mouth at bedtime as needed for anxiety. 30 tablet 0   nortriptyline (PAMELOR) 25 MG capsule TAKE 3 CAPSULES BY MOUTH AT BEDTIME 270 capsule 0   vitamin B-12 (CYANOCOBALAMIN) 50 MCG tablet Take 50 mcg by mouth daily.     No current facility-administered medications for this visit.    Medication Side Effects: None  Allergies: No Known Allergies  Past Medical History:  Diagnosis Date   Benign non-nodular prostatic hyperplasia with lower urinary tract symptoms 12/21/2014   Chronic sinusitis 08/23/2017   Combined forms of age-related cataract of both eyes 06/15/2016   Elevated prostate specific antigen (PSA) 07/03/2011   Epiretinal membrane, bilateral 06/03/2015   Epistaxis 08/16/2017   History of electroconvulsive therapy    Hypertension    Left ureteral stone 01/28/2015   Lithium-induced tremor    Major depressive disorder in remission    MCI (mild cognitive impairment) with memory loss 01/05/2021   Peripheral neuropathy    Recurrent nephrolithiasis 09/02/2017   Vitreous syneresis of both eyes 06/03/2015    Family History  Problem Relation Age of Onset   Lung cancer Mother    Heart disease Father    Pancreatic cancer Sister    Healthy Son    Healthy Daughter     Social History   Socioeconomic History   Marital status: Married    Spouse name: Not on file   Number of children: Not on file   Years of education:  20   Highest education level: Professional school degree (e.g., MD, DDS, DVM, JD)  Occupational History   Occupation: retired    Comment: urologist  Tobacco Use   Smoking status: Never   Smokeless tobacco: Never  Substance and Sexual Activity   Alcohol use: Yes    Comment: very rare   Drug use: No   Sexual activity: Yes  Other Topics Concern   Not on file  Social History Narrative   Right handed   Drinks caffeine    Two story home   Social Determinants of Health   Financial Resource Strain: Not on file  Food Insecurity: Not on file  Transportation Needs: Not on file  Physical Activity: Not on file  Stress: Not on file  Social Connections: Unknown (11/07/2021)   [facility]   Social Network    Social Network: Not on file  Intimate Partner Violence: Unknown (09/29/2021)   Received from [facility]   HITS    Physically Hurt: Not on file    Insult or Talk Down To: Not on file    Threaten Physical Harm: Not on file    Scream or Curse: Not on file    Past Medical History, Surgical history, Social history, and Family history were reviewed and updated as appropriate.   Please see review of systems for further details on the patient's review from today.   Objective:   Physical Exam:  There were no vitals taken for this visit.  Physical Exam Constitutional:  General: He is not in acute distress.    Appearance: He is well-developed.  Musculoskeletal:        General: No deformity.  Neurological:     Mental Status: He is alert and oriented to person, place, and time.     Motor: No tremor.     Coordination: Coordination normal.     Gait: Gait normal.  Psychiatric:        Attention and Perception: Attention and perception normal.        Mood and Affect: Mood is not anxious or depressed. Affect is not labile, blunt, angry or inappropriate.        Speech: Speech normal.        Behavior: Behavior normal.        Thought Content: Thought content normal.  Thought content is not delusional. Thought content does not include homicidal or suicidal ideation. Thought content does not include suicidal plan.        Cognition and Memory: Cognition normal.        Judgment: Judgment normal.     Comments: Insight intact. No auditory or visual hallucinations. No delusions.  Depression resolved upbeat     Lab Review:     Component Value Date/Time   NA 143 06/22/2022 0849   K 4.6 06/22/2022 0849   CL 104 06/22/2022 0849   CO2 22 06/22/2022 0849   GLUCOSE 144 (H) 06/22/2022 0849   GLUCOSE 81 04/18/2018 0937   BUN 19 06/22/2022 0849   CREATININE 1.15 06/22/2022 0849   CREATININE 1.18 (H) 04/18/2018 0937   CALCIUM 9.5 06/22/2022 0849   PROT 7.5 12/19/2016 2211   ALBUMIN 4.4 12/19/2016 2211   AST 29 12/19/2016 2211   ALT 42 12/19/2016 2211   ALKPHOS 54 12/19/2016 2211   BILITOT 0.7 12/19/2016 2211   GFRNONAA 59 (L) 03/25/2019 1101   GFRAA 68 03/25/2019 1101       Component Value Date/Time   WBC 12.5 (H) 12/19/2016 2211   RBC 5.19 12/19/2016 2211   HGB 15.6 12/19/2016 2211   HCT 46.2 12/19/2016 2211   PLT 265 12/19/2016 2211   MCV 89.0 12/19/2016 2211   MCH 30.1 12/19/2016 2211   MCHC 33.8 12/19/2016 2211   RDW 14.1 12/19/2016 2211   LYMPHSABS 1.3 12/19/2016 2211   MONOABS 0.9 12/19/2016 2211   EOSABS 0.1 12/19/2016 2211   BASOSABS 0.0 12/19/2016 2211    Lithium Lvl  Date Value Ref Range Status  12/21/2022 1.0 0.6 - 1.2 mmol/L Final  12/21/22 lithium 1.0 on 600 mg nightly & level 53 on 75 mg HS  06/22/22 nortrip level 32 06/22/22 lithium level 0.6 on 600 mg daily.  11/30/20 lab and TC ;His nortriptyline level is 303 and should be 150 or lower.  He is on 2 of the 50 mg capsules.  05/03/2021 lithium level 0.8 on 450 mg nightly  Normal B12 in March 2023  No results found for: "PHENYTOIN", "PHENOBARB", "VALPROATE", "CBMZ"   .res Assessment: Plan:    Major depressive disorder, recurrent episode, moderate (HCC)  Insomnia due  to mental condition  Lithium use  Lithium-induced tremor  Low serum vitamin B12   30 min face to face time with patient was spent on counseling and coordination of care.  Urgent appt.   March 2023 relapse of depression.  It was discovered by blood levels as well as by history that there was some inconsistency with compliance with both nortriptyline and lithium.  Lithium dosage has been increased and  nortriptyline level has been increased. 06/22/22 nortrip level 32 06/22/22 lithium level 0.6 on 600 mg daily. Was Much better, depression resolved, with lithium and nortriptyline after compliant again.  June 2024 relapse again. 11/2022 nortrip level 53 and lithium level 1.0   He is having some side effects with the lithium including tremor and dropping things. Seeing Dr. Arbutus Mathis for tremor.  He is addressing these issues. Rx Sinemet but not noticed yet.  Discussed side effects of both these medications. Counseled patient regarding potential benefits, risks, and side effects of lithium to include potential risk of lithium affecting thyroid and renal function.  Discussed need for periodic lab monitoring to determine drug level and to assess for potential adverse effects.  Counseled patient regarding signs and symptoms of lithium toxicity and advised that they notify office immediately or seek urgent medical attention if experiencing these signs and symptoms.  Patient advised to contact office with any questions or concerns.  Disc equivocal DAT scan but also likely lithium tremor.  Increase nortriptyline to 100 mg nightly  (4 of the 25 mg capsules or 2 of the 50 mg capsules nightly) Reduce lithium to 3 of the 150 mg capsules which should help the tremor  B 6 vitamin 250 mg twice daily to see if tremor is better.  Try a month and if not better stop it.  Sleep ok off lorazepam  Discussed side effects of nortriptyline which could include anticholinergic side effects because of his age could include  symptoms like confusion.  He has no such side effects at this time.  Contact us if you have the symptoms or any other symptoms such as sedation etc.  B12 level and need for supplements.  He had stopped. History low B12.  Was OK.  Continues gym and church Discussed relapse prevention techniques.  H  Follow-up 1 mos .  Meredith Staggers MD, DFAPA  Please see After Visit Summary for patient specific instructions.  Future Appointments  Date Time Provider Department Center  01/22/2023  8:45 AM Tat, Octaviano Batty, DO [facility] None  02/14/2023 10:00 AM Cottle, Steva Ready., MD [facility] None  03/19/2023  2:30 PM Cottle, Steva Ready., MD [facility] None  04/12/2023 11:15 AM Tat, Octaviano Batty, DO [facility] None    No orders of the defined types were placed in this encounter.     -------------------------------

## 2023-01-04 NOTE — Patient Instructions (Addendum)
Increase nortriptyline to 100 mg nightly  (4 of the 25 mg capsules or 2 of the 50 mg capsules nightly) Reduce lithium to 3 of the 150 mg capsules which should help the tremor  B 6 vitamin 250 mg twice daily to see if tremor is better.  Try a month and if not better stop it.

## 2023-01-05 DIAGNOSIS — N1831 Chronic kidney disease, stage 3a: Secondary | ICD-10-CM | POA: Diagnosis not present

## 2023-01-05 DIAGNOSIS — R251 Tremor, unspecified: Secondary | ICD-10-CM | POA: Diagnosis not present

## 2023-01-05 DIAGNOSIS — Z23 Encounter for immunization: Secondary | ICD-10-CM | POA: Diagnosis not present

## 2023-01-05 DIAGNOSIS — Z79899 Other long term (current) drug therapy: Secondary | ICD-10-CM | POA: Diagnosis not present

## 2023-01-05 DIAGNOSIS — F325 Major depressive disorder, single episode, in full remission: Secondary | ICD-10-CM | POA: Diagnosis not present

## 2023-01-05 DIAGNOSIS — E7800 Pure hypercholesterolemia, unspecified: Secondary | ICD-10-CM | POA: Diagnosis not present

## 2023-01-05 DIAGNOSIS — R269 Unspecified abnormalities of gait and mobility: Secondary | ICD-10-CM | POA: Diagnosis not present

## 2023-01-05 DIAGNOSIS — I129 Hypertensive chronic kidney disease with stage 1 through stage 4 chronic kidney disease, or unspecified chronic kidney disease: Secondary | ICD-10-CM | POA: Diagnosis not present

## 2023-01-05 DIAGNOSIS — E1169 Type 2 diabetes mellitus with other specified complication: Secondary | ICD-10-CM | POA: Diagnosis not present

## 2023-01-05 DIAGNOSIS — Z0000 Encounter for general adult medical examination without abnormal findings: Secondary | ICD-10-CM | POA: Diagnosis not present

## 2023-01-17 NOTE — Progress Notes (Signed)
Assessment/Plan:   1.  Gait instabity  -Peripheral neuropathy likely contributes.  He is on a B12 supplement.             -Patient is also on nortriptyline, which is an anticholinergic and certainly can contribute to gait instability, but he reports its needed for control of his depression  -Skin biopsy negative for alpha-synuclein in August, 2023  -skin bx was pos for PN which could produce gait instability  -DaTscan did demonstrate decreased uptake in the left caudate more than the right and mild decreased activity in the left posterior stratum.  -Patient's case is very complex, partially because the presence of tremor inducing agents, and because of the fact that his skin biopsy has been negative and DaTscan has been marginally positive.  Clinically, I do not think that the patient has idiopathic Parkinsons Disease.    -d/c levodopa.  I'm not convinced this is Parkinsons Disease, as above.  We could always use primidone in the future but if he gets worse off of levodopa he can let me know and we can decide whether we restart.    -MRI brain done in February, 2024 with evidence of mild small vessel disease only.   2.  B12 deficiency             -Patient now on supplementation.   3.  Tremor             -This is likely due to the lithium, and we discussed that again today.  He has both intention tremor (likely from lithium), but he has intermittent rest tremor as well (I have seen this on both sides independently)..  Studies are varied, but incidate that up to 2/3 of patients on lithium will have lithium-induced tremor, even if the patients are not toxic on the medication.  This is just a common side effect of patients on lithium.  Patients lithium was just recently decreased and he is doing much better in terms of tremor   4.  Memory change  -Neurocognitive testing done January 04, 2021 with evidence of MCI only.  Felt that this could be due to medication (nortriptyline, lithium).  This may have  progressed with time.  5.  Perceived proximal leg weakness  -not noted by me but patient c/o it  -CK today  -PT at [facility]  -EMG will be scheduled.  We will likely see some PN but want to make sure nothing else going on  Subjective:   Chase Mathis was seen today in neurologic follow-up.  Records reviewed since last visit.  Patient has had a positive DaTscan and a negative skin biopsy.  Pt is unaccompanied.  He is on tremor inducing medications.  They have wanted to stay on levodopa thus far.  Patient has had no falls.  Patient remains under the care of Dr. Jennelle Human, last being seen on July 11.  Notes reviewed.  His lithium was reduced because of tremor and nortriptyline was increased.  He is using a weight on his hand and he thinks it has been helpful for his tremor. Tremor may be a little better overall - he can pick up the communion cups in front of the the church better than in the past.    He reports he came today because his "thighs" are not working like they used to.  He can get in/out of bed without trouble but he is having trouble doing a squat.  He isn't sure he ever could but he  saw it on TV and thought he should be able to.  He can get up/down stairs and in/out of car.  The mm are not painful.  He is not on cholesterol medication.  He has not had an CK drawn.  He has no numbness or paresthesias.  No back pain.  No falls  Current medication: Carbidopa/levodopa 25/100, 1 tablet 3 times per day  ALLERGIES:  No Known Allergies  CURRENT MEDICATIONS:  Outpatient Encounter Medications as of 01/22/2023  Medication Sig   amLODipine (NORVASC) 5 MG tablet Take 5 mg by mouth daily.   finasteride (PROSCAR) 5 MG tablet Take 1 tablet (5 mg total) by mouth daily.   lisinopril (PRINIVIL,ZESTRIL) 10 MG tablet Take 10 mg by mouth daily.   lithium carbonate 150 MG capsule Take 3 capsules (450 mg total) by mouth at bedtime.   LORazepam (ATIVAN) 0.5 MG tablet Take 1 tablet (0.5 mg total) by mouth at  bedtime as needed for anxiety.   nortriptyline (PAMELOR) 50 MG capsule Take 2 capsules (100 mg total) by mouth at bedtime.   vitamin B-12 (CYANOCOBALAMIN) 50 MCG tablet Take 50 mcg by mouth daily.   [DISCONTINUED] carbidopa-levodopa (SINEMET IR) 25-100 MG tablet TAKE 1 TABLET BY MOUTH 3 TIMES A DAY (6AM,11AM AND 4PM)   No facility-administered encounter medications on file as of 01/22/2023.    Objective:   PHYSICAL EXAMINATION:    VITALS:   Vitals:   01/22/23 0851  BP: 136/80  Pulse: 72  SpO2: 97%  Weight: 212 lb 9.6 oz (96.4 kg)  Height: 5\' 8"  (1.727 m)     GEN:  Normal appears male in no acute distress.  Appears younger than stated age. HEENT:  Normocephalic, atraumatic. The mucous membranes are moist.  Cardiovascular: Regular rate rhythm Lungs: Clear to auscultation bilaterally  NEUROLOGICAL: Orientation: The patient is alert and oriented x3.  Cranial nerves: There is good facial symmetry.  Extraocular muscles are intact. The visual fields are full to confrontational testing. The speech is fluent and clear. Soft palate rises symmetrically and there is no tongue deviation. Hearing is intact to conversational tone. Sensation: Sensation is intact to light touch throughout. Motor: Strength is 5/5 in the bilateral upper and lower extremities.   no proximal mm weakness.  Able to get up from low chair without hands.  Shoulder shrug is equal and symmetric.  There is no pronator drift. DTR:  2-/4 at the bilateral biceps, triceps, brachioradialis, 1/4 at the bilateral patella.    Movement examination: Tone: There is nl tone today Abnormal movements: There is min tremor at rest in both thumbs.  There is mild intention tremor, R>L Coordination:  There is no significant decremation with any form of RAMS, including alternating supination and pronation of the forearm, hand opening and closing, finger taps, heel taps and toe taps.  Gait and Station: The patient pushes off to arise.  He is  unsteady.  He is short stepped   Chemistry      Component Value Date/Time   NA 143 06/22/2022 0849   K 4.6 06/22/2022 0849   CL 104 06/22/2022 0849   CO2 22 06/22/2022 0849   BUN 19 06/22/2022 0849   CREATININE 1.15 06/22/2022 0849   CREATININE 1.18 (H) 04/18/2018 0937      Component Value Date/Time   CALCIUM 9.5 06/22/2022 0849   ALKPHOS 54 12/19/2016 2211   AST 29 12/19/2016 2211   ALT 42 12/19/2016 2211   BILITOT 0.7 12/19/2016 2211  Lab Results  Component Value Date   VITAMINB12 >2000 (H) 09/16/2021    Total time spent on today's visit was 30 minutes, including both face-to-face time and nonface-to-face time.  Time included that spent on review of records (prior notes available to me/labs/imaging if pertinent), discussing treatment and goals, answering patient's questions and coordinating care.    Cc:  Merlene Laughter, MD (Inactive)

## 2023-01-22 ENCOUNTER — Encounter: Payer: Self-pay | Admitting: Neurology

## 2023-01-22 ENCOUNTER — Ambulatory Visit (INDEPENDENT_AMBULATORY_CARE_PROVIDER_SITE_OTHER): Payer: Medicare Other | Admitting: Neurology

## 2023-01-22 ENCOUNTER — Other Ambulatory Visit (INDEPENDENT_AMBULATORY_CARE_PROVIDER_SITE_OTHER): Payer: Medicare Other

## 2023-01-22 VITALS — BP 136/80 | HR 72 | Ht 68.0 in | Wt 212.6 lb

## 2023-01-22 DIAGNOSIS — M791 Myalgia, unspecified site: Secondary | ICD-10-CM

## 2023-01-22 DIAGNOSIS — R29898 Other symptoms and signs involving the musculoskeletal system: Secondary | ICD-10-CM

## 2023-01-22 DIAGNOSIS — G251 Drug-induced tremor: Secondary | ICD-10-CM | POA: Diagnosis not present

## 2023-01-22 DIAGNOSIS — R2689 Other abnormalities of gait and mobility: Secondary | ICD-10-CM

## 2023-01-22 LAB — CK: Total CK: 70 U/L (ref 7–232)

## 2023-01-22 NOTE — Patient Instructions (Addendum)
HOLD carbidopa/levodopa   Your provider has requested that you have labwork completed today. The lab is located on the Second floor at [address], within the [facility] office. When you get off the elevator, turn right and go in the [facility] [address]; the first brown door on the left.  Tell the ladies behind the desk that you are there for lab work. If you are not called within 15 minutes please check with the front desk.   Once you complete your labs you are free to go. You will receive a call or message via MyChart with your lab results.

## 2023-01-30 ENCOUNTER — Other Ambulatory Visit: Payer: Self-pay | Admitting: Psychiatry

## 2023-02-01 ENCOUNTER — Other Ambulatory Visit: Payer: Self-pay | Admitting: Psychiatry

## 2023-02-01 DIAGNOSIS — M6281 Muscle weakness (generalized): Secondary | ICD-10-CM | POA: Diagnosis not present

## 2023-02-01 DIAGNOSIS — F3342 Major depressive disorder, recurrent, in full remission: Secondary | ICD-10-CM

## 2023-02-01 DIAGNOSIS — M25551 Pain in right hip: Secondary | ICD-10-CM | POA: Diagnosis not present

## 2023-02-01 DIAGNOSIS — R2681 Unsteadiness on feet: Secondary | ICD-10-CM | POA: Diagnosis not present

## 2023-02-03 NOTE — Telephone Encounter (Signed)
Has appt 8/12

## 2023-02-05 ENCOUNTER — Other Ambulatory Visit: Payer: Self-pay | Admitting: Psychiatry

## 2023-02-05 ENCOUNTER — Encounter: Payer: Self-pay | Admitting: Psychiatry

## 2023-02-05 ENCOUNTER — Ambulatory Visit (INDEPENDENT_AMBULATORY_CARE_PROVIDER_SITE_OTHER): Payer: Medicare Other | Admitting: Psychiatry

## 2023-02-05 DIAGNOSIS — F5105 Insomnia due to other mental disorder: Secondary | ICD-10-CM | POA: Diagnosis not present

## 2023-02-05 DIAGNOSIS — G251 Drug-induced tremor: Secondary | ICD-10-CM

## 2023-02-05 DIAGNOSIS — E538 Deficiency of other specified B group vitamins: Secondary | ICD-10-CM | POA: Diagnosis not present

## 2023-02-05 DIAGNOSIS — F331 Major depressive disorder, recurrent, moderate: Secondary | ICD-10-CM | POA: Diagnosis not present

## 2023-02-05 DIAGNOSIS — Z79899 Other long term (current) drug therapy: Secondary | ICD-10-CM | POA: Diagnosis not present

## 2023-02-05 NOTE — Telephone Encounter (Signed)
Dose changed to 100 mg HS

## 2023-02-05 NOTE — Progress Notes (Signed)
Chase Mathis [id number] 07-24-1933 89 y.o.  Subjective:   Patient ID:  Chase Mathis is a 89 y.o. (DOB 12/28/1933) male.  Chief Complaint:  Chief Complaint  Patient presents with   Follow-up   Depression   Anxiety    Depression        Associated symptoms include fatigue.  Associated symptoms include no decreased concentration and no suicidal ideas.  Chase Mathis presents to the office today for follow-up of recurrence of depression that occurred at the end of 2019.  He responded to an increase in lithium from 300 to 450 mg daily..  [name] had about a 4-year history of treatment resistant major depression that ultimately responded to ECT in 2009.  He had an early relapse while on nortriptyline alone and lithium was added and he had booster ECTs and has been in remission since that time.  seen in November 2019.  His depression had gone into remission again with increase in lithium from 300 to 450 mg daily.  No med changes were made at that time.d  02/2019 appt : Continue lithium 150 mg capsules 3 daily Continue nortriptyline 50 mg capsules 2 nightly  11/30/20 lab and TC ;His nortriptyline level is 303 and should be 150 or lower.  He is on 2 of the 50 mg capsules.  Tell him to cut the dose back to one of the 50 mg capsules.  He has an appointment in August.  Let them know if he has any problem with the dosage reduction and to let us know.  02/10/2021 appointment following noted: No problems with less nortriptyline to 50 mg nightly. Fine until 2-3 mos ago.  Was working in back yard and got weak and fell and couldn't get up.  Hasn't happened again. Saw neuro Dr. Arbutus Leas. No PD and probably lithium tremor.  Equivical DaTscan. Had neuropsych testing and dx with [facility]. No further episodes of this.   I'm fine now.  Remained in remission.  Patient reports stable mood and denies depressed or irritable moods.  Patient denies any recent difficulty with anxiety.  Patient denies difficulty with sleep  initiation but occ maintenance with work dreams with anxiety.  Benadryl helps. Denies appetite disturbance.  Patient reports that energy and motivation have been good.  Patient denies any difficulty with concentration.  Patient denies any suicidal ideation. Minimal tremor.  04/25/2021 appointment with the following noted: Did not get lithium level and labs as requested. [facility]oved [facility].  Didn't think it would be a problem.  Small place.  Younger than others.He doesn't like it bc of the change similar to what he had after moving to [place] years ago leading to a severe prolonged depression.  Feels he made a mistake but trying to ease up on his thinking bc he realizes it's not all about him. Dispersed his furniture.  Thinks he's adjusting better.   Once weekly goes to [employer]'s [employer] mixed [employer].  Involved in other things.   Plan: Continue lithium 150 mg capsules 3 daily Continue nortriptyline 50 mg capsules 1 nightly Check lithium level  09/12/2021 phone call wanting to be worked in sooner due to anxiety.  09/15/2021 appointment with the following noted: seen with wife Chase Mathis Nortriptyline level ordered February 10, 2021 never completed Moved to [facility] several months ago and having trouble adjusting despite 6 years. Can't sleep and trouble with anxiety.  Trouble adjusting.  Can't shut his mind off.   Chase Mathis is  per wife it's crowded and small space.  She feels the same way about it.  She also feels it is a small space also.   She is struggling with some depression about it. Too. Living at [facility].   He needs an office and a place to think and has an office. Usually to gym several times a week and walking with wife a couple of times per week.   Feels a need to make the place work.   Did this mainly to please the kids.   Consistent with nortriptyline 50 mg HS and lithium 450 but wife says she thinks he misses some of it. No SE. Mainly initial insomnia.  2-3   hours nightly.Naps an hour and she thinks he might nap a little more. Active at church and in choir.  First [employer]. Gradually harder to do things.   Plan: Continue lithium 150 mg capsules 3 daily Continue nortriptyline 50 mg capsules 1 nightly Check lithium and nortriptyline levels. Needs something for sleep.  Lorazepam 0.5-1.0  mg HS  09/16/2021 lab note: Patient has relapsed with depression and anxiety and this is related to insufficient blood levels of his meds due to noncompliance: His nortriptyline level is less than 20 which means it is not detectable with a prescribed dose of 50 mg nightly.  In June 2020 his nortriptyline level was 303 on 100 mg daily and we reduced it to 50 mg daily.  Optimal levels are 50 to 150 ng/ml  This is highly suggestive that he is not taking nortriptyline on a regular basis.  He is missing a number of dosages.  At his last appointment his wife indicated she felt he was missing significant dosages.   His lithium level was also low and that was recently increased from 3 of the 150 mg capsules to 4 of the 150 mg capsules.  I think it was low because he was missing dosages of that as well.  He needs to use a pillbox to improve compliance.  He will not get better if he does not take his medicine. Tell him to increase nortriptyline to 75 mg at night.  I will send in prescription for 3 of the 25 mg capsules. Continue lithium 600 mg nightly.  Repeat his blood levels after about a week. I sent in a orders to [facility]. He can use the lorazepam 0.5 mg 1 twice daily for anxiety and 1-2 at night for sleep.  But this will not solve his symptoms until he is more compliant with nortriptyline and lithium as indicated in the other note sent today.  10/05/21 lab noted: On nortriptyline 75 mg nightly level was still low at 49.  Therefore nortriptyline increased to 100 mg nightly.  He needs to be very compliant with this and the lithium level which had also been  low.  10/14/2021 appointment with the following noted:  with wife Chase Mathis I'm better.  Rx lorazepam helped improve sleep taking it BID Disc noncompliance which led to relapse. Wife and D in law are making sure he is taking the meds. But not using the pill box. Admits he was neglecting himself and doesn't know why that was necessary.   Feels much better.  Goes to gym and wants to do it again and didn't want to do it when depressed.   Sleep and less anxious.  More interest in things.  More enjoyment. Increased tremor and dropping some pills Appetite had gone down over a couple of  mos and lost 20# but appetite has returned. Wife notices he is having some mild inattentiveness and forgetfulness.  11/29/21 appt noted: Tolerated in crease meds. Ativan helped sleep and insomnia triggered the problems . Lihtium tremor is mild Sing in choir. More consistent with meds and no mistakes. Out of the depression and anxiety Plan: Continue lithium 150 mg capsules 4 daily Continue nortriptyline 75 mg capsules 1 nightly Check lithium and nortriptyline levels again.   06/14/22 appt noted: Normal B12 in March 2023 Dr. Arbutus Leas dx periperal neuropathy and likely PD and also ikely lithium tremor.  Sinemet hasn't helped tremor. Not a severe tremor but is annoying. Mood is good and Chase Mathis agrees .  Not that anxious and hasn't needed lorazepam.   Sleep is good.   Consistent. [facility] resident. Plan: much better again with consistency of : Continue lithium 150 mg capsules 4 daily Continue nortriptyline 75 mg capsules 1 nightly Check lithium and nortriptyline levels again.  Order sent to [facility].  Discussed the risk of toxicity due to recent unstable blood levels.  Will try to reduce if possible.  08/03/22 TC wife:  Chase Mathis, wife, asks if he can come off of the lorazepam? He is too sleepy and sleeping way too much. Gets up early and is going back to bed at 8 or 9 and sleeping a few hours again.    MD resp:  I  thought I sent a response to this but tell her or him to cut the dose by 50% for 2 weeks and see how he does.  If he's slepeing ok then stop it.     09/13/22 appt noted; "I'm fine".  Still some tremor.  No recent falls.  Exercising.   Seeing Dr. Arbutus Leas.  Concerns about possible PD and has peripheral neuropathy.  Concerns about this but not overly worried.  Sleeping ok.  No problems off lorazpem. Attends  [employer].    01/04/23 appt noted:89 yo and tremor more More difficulty sleeping thinking of stupid things. Has awoken thinking of surgery . Gradually getting better with dep and anxiety but did have more sx in the last couple of mos. Can't let go of bad stuff but no reason to hang on to it is part of the problem. Plan: Increase nortriptyline to 100 mg nightly  (4 of the 25 mg capsules or 2 of the 50 mg capsules nightly) Reduce lithium to 3 of the 150 mg capsules which should help the tremor B 6 vitamin 250 mg twice daily to see if tremor is better.  Try a month and if not better stop it.  02/05/23 appt noted: Tremor better with less lithium  to 450 mg .  Pretty much gone. Hasn't started B6. Thinks he is fine now with mood and anxiety.   Total 3 episodes depression and out of it again.  Last one is the Chase minor.   Some balance issues.  Still singing at church.  No sig SE nortriptyline.  Not aware of anything.   Wife also sees him better.  Past Psychiatric Medication Trials:  ECT Paroxetine 60, duloxetine anxiety, sertraline 3 weeks, Lexapro 10 x 30 days, Effexor 225 Abilify 5 no response Quetiapine Geodon Adderall trazodone November 2018, we reduced the nortriptyline from 150 mg to 100 mg daily and reduced the lithium from 450 mg a day to 300 mg daily because of some tremor and because he wanted to try to reduce the medication.  His blood levels on the higher  dosages were lithium 0.7 nortriptyline 103.   On the lower dosages the level in October were:  Li 0.4.   Nortriptyline 224.  Review of Systems:  Review of Systems  Constitutional:  Positive for fatigue.  Cardiovascular:  Negative for palpitations.  Gastrointestinal:  Negative for diarrhea.  Musculoskeletal:  Positive for gait problem.  Neurological:  Negative for dizziness, tremors and weakness.  Psychiatric/Behavioral:  Negative for agitation, behavioral problems, confusion, decreased concentration, dysphoric mood, hallucinations, self-injury, sleep disturbance and suicidal ideas. The patient is not nervous/anxious and is not hyperactive.     Medications: I have reviewed the patient's current medications.  Current Outpatient Medications  Medication Sig Dispense Refill   amLODipine (NORVASC) 5 MG tablet Take 5 mg by mouth daily.     finasteride (PROSCAR) 5 MG tablet Take 1 tablet (5 mg total) by mouth daily. 90 tablet 3   lisinopril (PRINIVIL,ZESTRIL) 10 MG tablet Take 10 mg by mouth daily.     lithium carbonate 150 MG capsule Take 3 capsules (450 mg total) by mouth at bedtime. 270 capsule 0   LORazepam (ATIVAN) 0.5 MG tablet TAKE 1 TABLET BY MOUTH AT BEDTIME AS NEEDED FOR ANXIETY. 30 tablet 0   nortriptyline (PAMELOR) 50 MG capsule Take 2 capsules (100 mg total) by mouth at bedtime. 180 capsule 0   vitamin B-12 (CYANOCOBALAMIN) 50 MCG tablet Take 50 mcg by mouth daily.     No current facility-administered medications for this visit.    Medication Side Effects: None  Allergies: No Known Allergies  Past Medical History:  Diagnosis Date   Benign non-nodular prostatic hyperplasia with lower urinary tract symptoms 12/21/2014   Chronic sinusitis 08/23/2017   Combined forms of age-related cataract of both eyes 06/15/2016   Elevated prostate specific antigen (PSA) 07/03/2011   Epiretinal membrane, bilateral 06/03/2015   Epistaxis 08/16/2017   History of electroconvulsive therapy    Hypertension    Left ureteral stone 01/28/2015   Lithium-induced tremor    Major depressive disorder in  remission    MCI (mild cognitive impairment) with memory loss 01/05/2021   Peripheral neuropathy    Recurrent nephrolithiasis 09/02/2017   Vitreous syneresis of both eyes 06/03/2015    Family History  Problem Relation Age of Onset   Lung cancer Mother    Heart disease Father    Pancreatic cancer Sister    Healthy Son    Healthy Daughter     Social History   Socioeconomic History   Marital status: Married    Spouse name: Not on file   Number of children: Not on file   Years of education: 20   Highest education level: Professional school degree (e.g., MD, DDS, DVM, JD)  Occupational History   Occupation: retired    Comment: urologist  Tobacco Use   Smoking status: Never   Smokeless tobacco: Never  Substance and Sexual Activity   Alcohol use: Yes    Comment: very rare   Drug use: No   Sexual activity: Yes  Other Topics Concern   Not on file  Social History Narrative   Right handed   Drinks caffeine    Two story home   Social Determinants of Health   Financial Resource Strain: Not on file  Food Insecurity: Not on file  Transportation Needs: Not on file  Physical Activity: Not on file  Stress: Not on file  Social Connections: Unknown (11/07/2021)   [facility]   Social Network    Social Network: Not on  file  Intimate Partner Violence: Unknown (09/29/2021)   Received from [facility]   HITS    Physically Hurt: Not on file    Insult or Talk Down To: Not on file    Threaten Physical Harm: Not on file    Scream or Curse: Not on file    Past Medical History, Surgical history, Social history, and Family history were reviewed and updated as appropriate.   Please see review of systems for further details on the patient's review from today.   Objective:   Physical Exam:  There were no vitals taken for this visit.  Physical Exam Constitutional:      General: He is not in acute distress.    Appearance: He is well-developed.  Musculoskeletal:         General: No deformity.  Neurological:     Mental Status: He is alert and oriented to person, place, and time.     Motor: No tremor.     Coordination: Coordination normal.     Gait: Gait normal.  Psychiatric:        Attention and Perception: Attention and perception normal.        Mood and Affect: Mood is not anxious or depressed. Affect is not labile, blunt or inappropriate.        Speech: Speech normal.        Behavior: Behavior normal.        Thought Content: Thought content normal. Thought content is not delusional. Thought content does not include homicidal or suicidal ideation. Thought content does not include suicidal plan.        Cognition and Memory: Cognition normal.        Judgment: Judgment normal.     Comments: Insight intact. No auditory or visual hallucinations. No delusions.  Depression resolved and upbeat     Lab Review:     Component Value Date/Time   NA 143 06/22/2022 0849   K 4.6 06/22/2022 0849   CL 104 06/22/2022 0849   CO2 22 06/22/2022 0849   GLUCOSE 144 (H) 06/22/2022 0849   GLUCOSE 81 04/18/2018 0937   BUN 19 06/22/2022 0849   CREATININE 1.15 06/22/2022 0849   CREATININE 1.18 (H) 04/18/2018 0937   CALCIUM 9.5 06/22/2022 0849   PROT 7.5 12/19/2016 2211   ALBUMIN 4.4 12/19/2016 2211   AST 29 12/19/2016 2211   ALT 42 12/19/2016 2211   ALKPHOS 54 12/19/2016 2211   BILITOT 0.7 12/19/2016 2211   GFRNONAA 59 (L) 03/25/2019 1101   GFRAA 68 03/25/2019 1101       Component Value Date/Time   WBC 12.5 (H) 12/19/2016 2211   RBC 5.19 12/19/2016 2211   HGB 15.6 12/19/2016 2211   HCT 46.2 12/19/2016 2211   PLT 265 12/19/2016 2211   MCV 89.0 12/19/2016 2211   MCH 30.1 12/19/2016 2211   MCHC 33.8 12/19/2016 2211   RDW 14.1 12/19/2016 2211   LYMPHSABS 1.3 12/19/2016 2211   MONOABS 0.9 12/19/2016 2211   EOSABS 0.1 12/19/2016 2211   BASOSABS 0.0 12/19/2016 2211    Lithium Lvl  Date Value Ref Range Status  12/21/2022 1.0 0.6 - 1.2 mmol/L Final   12/21/22 lithium 1.0 on 600 mg nightly & level 53 on 75 mg HS  06/22/22 nortrip level 32 06/22/22 lithium level 0.6 on 600 mg daily.  11/30/20 lab and TC ;His nortriptyline level is 303 and should be 150 or lower.  He is on 2 of the 50 mg capsules.  05/03/2021  lithium level 0.8 on 450 mg nightly  Normal B12 in March 2023  No results found for: "PHENYTOIN", "PHENOBARB", "VALPROATE", "CBMZ"   .res Assessment: Plan:    Major depressive disorder, recurrent episode, moderate (HCC)  Insomnia due to mental condition  Lithium use  Lithium-induced tremor  Low serum vitamin B12   30 min face to face time with patient was spent on counseling and coordination of care.  Urgent appt.   March 2023 relapse of depression.  It was discovered by blood levels as well as by history that there was some inconsistency with compliance with both nortriptyline and lithium.  Lithium dosage has been increased and nortriptyline level has been increased. 06/22/22 nortrip level 32 06/22/22 lithium level 0.6 on 600 mg daily. Was Much better, depression resolved, with lithium and nortriptyline after compliant again.  June 2024 relapse again. 11/2022 nortrip level 53 and lithium level 1.0   He is having some side effects with the lithium including tremor and dropping things. Seeing Dr. Arbutus Leas for tremor.  He is addressing these issues. Rx Sinemet but not noticed yet.  Discussed side effects of both these medications. Counseled patient regarding potential benefits, risks, and side effects of lithium to include potential risk of lithium affecting thyroid and renal function.  Discussed need for periodic lab monitoring to determine drug level and to assess for potential adverse effects.  Counseled patient regarding signs and symptoms of lithium toxicity and advised that they notify office immediately or seek urgent medical attention if experiencing these signs and symptoms.  Patient advised to contact office with any  questions or concerns.  Disc equivocal DAT scan but also likely lithium tremor.  Better with Increase nortriptyline to 100 mg nightly  (4 of the 25 mg capsules or 2 of the 50 mg capsules nightly) Less tremor with with less lithium to 3 of the 150 mg capsules .  Minimal tremor  B 6 vitamin 250 mg twice daily to see if tremor is better.  Try a month and if not better stop it.  Sleep ok off lorazepam  Discussed side effects of nortriptyline which could include anticholinergic side effects because of his age could include symptoms like confusion.  He has no such side effects at this time.  Contact us if you have the symptoms or any other symptoms such as sedation etc.  B12 level and need for supplements.  He had stopped. History low B12.  Was OK.  Continues gym and church Discussed relapse prevention techniques.  H  Follow-up 1 mos .  Meredith Staggers MD, DFAPA  Please see After Visit Summary for patient specific instructions.  Future Appointments  Date Time Provider Department Center  02/08/2023 10:15 AM Glendale Chard, DO [facility] None  02/14/2023 10:00 AM Cottle, Steva Ready., MD [facility] None  03/15/2023  3:00 PM Cottle, Steva Ready., MD [facility] None  03/19/2023  2:30 PM Cottle, Steva Ready., MD [facility] None  07/06/2023  3:30 PM Tat, Octaviano Batty, DO [facility] None    No orders of the defined types were placed in this encounter.     -------------------------------

## 2023-02-06 DIAGNOSIS — R2681 Unsteadiness on feet: Secondary | ICD-10-CM | POA: Diagnosis not present

## 2023-02-06 DIAGNOSIS — M25551 Pain in right hip: Secondary | ICD-10-CM | POA: Diagnosis not present

## 2023-02-06 DIAGNOSIS — M6281 Muscle weakness (generalized): Secondary | ICD-10-CM | POA: Diagnosis not present

## 2023-02-08 ENCOUNTER — Ambulatory Visit (INDEPENDENT_AMBULATORY_CARE_PROVIDER_SITE_OTHER): Payer: Medicare Other | Admitting: Neurology

## 2023-02-08 DIAGNOSIS — M6281 Muscle weakness (generalized): Secondary | ICD-10-CM | POA: Diagnosis not present

## 2023-02-08 DIAGNOSIS — M25551 Pain in right hip: Secondary | ICD-10-CM | POA: Diagnosis not present

## 2023-02-08 DIAGNOSIS — G609 Hereditary and idiopathic neuropathy, unspecified: Secondary | ICD-10-CM

## 2023-02-08 DIAGNOSIS — R2681 Unsteadiness on feet: Secondary | ICD-10-CM | POA: Diagnosis not present

## 2023-02-08 DIAGNOSIS — R29898 Other symptoms and signs involving the musculoskeletal system: Secondary | ICD-10-CM | POA: Diagnosis not present

## 2023-02-08 NOTE — Procedures (Signed)
[facility]  [address] [place], [address]  [place], [place] [address] Tel: [phone] Fax: [phone] Test Date:  02/08/2023  Patient: Chase Mathis DOB: [birth year] Physician: Nita Sickle, DO  Sex: Male Height: 5\' 8"  Ref Phys: Kerin Salen, DO  ID#: [id number]   Technician:    History: This is a 89 year old man referred for evaluation of gait instability.  NCV & EMG Findings: Extensive electrodiagnostic testing of the right lower extremity and additional studies of the left shows:  Bilateral peroneal and tibial motor responses show reduced amplitudes.  Left peroneal motor response at the tibialis anterior is asymmetrically reduced compared to the right (L3.5, R5.6 mV). Bilateral tibial H reflex studies show prolonged latency. Chronic motor axonal loss changes are seen affecting the muscles below the knee bilaterally, as well as left gluteus medius muscle.  Impression: The electrophysiologic findings are consistent with a chronic sensorimotor axonal polyneuropathy affecting the lower extremities. Findings also suggest a superimposed chronic L5 radiculopathy affecting the left lower extremity, moderate.   ___________________________ Nita Sickle, DO    Nerve Conduction Studies   Stim Site NR Peak (ms) Norm Peak (ms) O-P Amp (V) Norm O-P Amp  Left Sup Peroneal Anti Sensory (Ant Lat Mall)  32 C  12 cm *NR  <4.6  >3  Right Sup Peroneal Anti Sensory (Ant Lat Mall)  32 C  12 cm *NR  <4.6  >3  Left Sural Anti Sensory (Lat Mall)  32 C  Calf *NR  <4.6  >3  Right Sural Anti Sensory (Lat Mall)  32 C  Calf *NR  <4.6  >3     Stim Site NR Onset (ms) Norm Onset (ms) O-P Amp (mV) Norm O-P Amp Site1 Site2 Delta-0 (ms) Dist (cm) Vel (m/s) Norm Vel (m/s)  Left Peroneal Motor (Ext Dig Brev)  32 C  Ankle    5.1 <6.0 *0.5 >2.5 B Fib Ankle 9.0 36.0 40 >40  B Fib    14.1  0.5  Poplt B Fib 2.0 8.0 40 >40  Poplt    16.1  0.6         Right Peroneal Motor (Ext Dig Brev)  32 C   Ankle    4.4 <6.0 *2.3 >2.5 B Fib Ankle 9.5 38.0 40 >40  B Fib    13.9  1.6  Poplt B Fib 1.7 8.0 47 >40  Poplt    15.6  1.5         Left Peroneal TA Motor (Tib Ant)  32 C  Fib Head    2.9 <4.5 3.5 >3 Poplit Fib Head 1.2 7.0 58 >40  Poplit    4.1 <5.7 3.4         Right Peroneal TA Motor (Tib Ant)  32 C  Fib Head    3.1 <4.5 5.6 >3 Poplit Fib Head 1.7 10.0 59 >40  Poplit    4.8 <5.7 5.5         Left Tibial Motor (Abd Hall Brev)  32 C  Ankle    5.0 <6.0 *2.9 >4 Knee Ankle 10.2 42.0 41 >40  Knee    15.2  2.0         Right Tibial Motor (Abd Hall Brev)  32 C  Ankle    4.3 <6.0 *2.4 >4 Knee Ankle 8.4 42.0 50 >40  Knee    12.7  1.6          Electromyography   Side Muscle Ins.Act Fibs Fasc Recrt Amp Dur Poly  Activation Comment  Right AntTibialis Nml Nml Nml *2- *1+ *1+ *1+ Nml N/A  Right Gastroc Nml Nml Nml *2- *1+ *1+ *1+ Nml N/A  Right Flex Dig Long Nml Nml Nml *2- *1+ *1+ *1+ Nml N/A  Right RectFemoris Nml Nml Nml Nml Nml Nml Nml Nml N/A  Right BicepsFemS Nml Nml Nml Nml Nml Nml Nml Nml N/A  Right GluteusMed Nml Nml Nml Nml Nml Nml Nml Nml N/A  Left AntTibialis Nml Nml Nml *2- *1+ *1+ *1+ Nml N/A  Left Gastroc Nml Nml Nml *2- *1+ *1+ *1+ Nml N/A  Left Flex Dig Long Nml Nml Nml *2- *1+ *1+ *1+ Nml N/A  Left RectFemoris Nml Nml Nml Nml Nml Nml Nml Nml N/A  Left GluteusMed Nml Nml Nml *1- *1+ *1+ *1+ Nml N/A      Waveforms:

## 2023-02-09 DIAGNOSIS — M25551 Pain in right hip: Secondary | ICD-10-CM | POA: Diagnosis not present

## 2023-02-09 DIAGNOSIS — R2681 Unsteadiness on feet: Secondary | ICD-10-CM | POA: Diagnosis not present

## 2023-02-09 DIAGNOSIS — M6281 Muscle weakness (generalized): Secondary | ICD-10-CM | POA: Diagnosis not present

## 2023-02-12 ENCOUNTER — Telehealth: Payer: Self-pay

## 2023-02-12 ENCOUNTER — Other Ambulatory Visit: Payer: Self-pay

## 2023-02-12 DIAGNOSIS — R2689 Other abnormalities of gait and mobility: Secondary | ICD-10-CM

## 2023-02-12 DIAGNOSIS — G8929 Other chronic pain: Secondary | ICD-10-CM

## 2023-02-12 NOTE — Telephone Encounter (Signed)
Called and spoke to patient about results he would like to add PT to his already PT program at [facility] for

## 2023-02-13 DIAGNOSIS — M25551 Pain in right hip: Secondary | ICD-10-CM | POA: Diagnosis not present

## 2023-02-13 DIAGNOSIS — M6281 Muscle weakness (generalized): Secondary | ICD-10-CM | POA: Diagnosis not present

## 2023-02-13 DIAGNOSIS — R2681 Unsteadiness on feet: Secondary | ICD-10-CM | POA: Diagnosis not present

## 2023-02-14 ENCOUNTER — Ambulatory Visit: Payer: Medicare Other | Admitting: Psychiatry

## 2023-02-15 DIAGNOSIS — R2681 Unsteadiness on feet: Secondary | ICD-10-CM | POA: Diagnosis not present

## 2023-02-15 DIAGNOSIS — M6281 Muscle weakness (generalized): Secondary | ICD-10-CM | POA: Diagnosis not present

## 2023-02-15 DIAGNOSIS — M25551 Pain in right hip: Secondary | ICD-10-CM | POA: Diagnosis not present

## 2023-02-16 DIAGNOSIS — M25551 Pain in right hip: Secondary | ICD-10-CM | POA: Diagnosis not present

## 2023-02-16 DIAGNOSIS — R2681 Unsteadiness on feet: Secondary | ICD-10-CM | POA: Diagnosis not present

## 2023-02-16 DIAGNOSIS — M6281 Muscle weakness (generalized): Secondary | ICD-10-CM | POA: Diagnosis not present

## 2023-02-18 ENCOUNTER — Other Ambulatory Visit: Payer: Self-pay | Admitting: Psychiatry

## 2023-02-19 DIAGNOSIS — M6281 Muscle weakness (generalized): Secondary | ICD-10-CM | POA: Diagnosis not present

## 2023-02-19 DIAGNOSIS — M25551 Pain in right hip: Secondary | ICD-10-CM | POA: Diagnosis not present

## 2023-02-19 DIAGNOSIS — R2681 Unsteadiness on feet: Secondary | ICD-10-CM | POA: Diagnosis not present

## 2023-02-21 DIAGNOSIS — R2681 Unsteadiness on feet: Secondary | ICD-10-CM | POA: Diagnosis not present

## 2023-02-21 DIAGNOSIS — M6281 Muscle weakness (generalized): Secondary | ICD-10-CM | POA: Diagnosis not present

## 2023-02-21 DIAGNOSIS — M25551 Pain in right hip: Secondary | ICD-10-CM | POA: Diagnosis not present

## 2023-02-22 ENCOUNTER — Telehealth: Payer: Self-pay | Admitting: Psychiatry

## 2023-02-22 NOTE — Telephone Encounter (Signed)
I do not have a refill request, either from pharmacy or phone message.  Last filled 8/6, due 9/3.

## 2023-02-22 NOTE — Telephone Encounter (Signed)
Dr. Sherlean Foot called today to check status of the refill of Lorazepam.  Appt 9/19.  Send to [id number] - [place], [place] - [address] AT [address] [address]

## 2023-02-22 NOTE — Telephone Encounter (Signed)
LVM to [facility] 

## 2023-02-23 DIAGNOSIS — R2681 Unsteadiness on feet: Secondary | ICD-10-CM | POA: Diagnosis not present

## 2023-02-23 DIAGNOSIS — M25551 Pain in right hip: Secondary | ICD-10-CM | POA: Diagnosis not present

## 2023-02-23 DIAGNOSIS — M6281 Muscle weakness (generalized): Secondary | ICD-10-CM | POA: Diagnosis not present

## 2023-02-23 NOTE — Telephone Encounter (Signed)
Wife informed that patient is not due until 9/3.

## 2023-02-27 ENCOUNTER — Other Ambulatory Visit: Payer: Self-pay

## 2023-02-27 DIAGNOSIS — M6281 Muscle weakness (generalized): Secondary | ICD-10-CM | POA: Diagnosis not present

## 2023-02-27 DIAGNOSIS — M25551 Pain in right hip: Secondary | ICD-10-CM | POA: Diagnosis not present

## 2023-02-27 DIAGNOSIS — R2681 Unsteadiness on feet: Secondary | ICD-10-CM | POA: Diagnosis not present

## 2023-02-27 MED ORDER — LORAZEPAM 0.5 MG PO TABS: 0.5000 mg | ORAL_TABLET | Freq: Every evening | ORAL | 0 refills | Status: DC | PRN

## 2023-02-27 NOTE — Telephone Encounter (Signed)
Pended.

## 2023-02-28 DIAGNOSIS — R2681 Unsteadiness on feet: Secondary | ICD-10-CM | POA: Diagnosis not present

## 2023-02-28 DIAGNOSIS — M6281 Muscle weakness (generalized): Secondary | ICD-10-CM | POA: Diagnosis not present

## 2023-02-28 DIAGNOSIS — M25551 Pain in right hip: Secondary | ICD-10-CM | POA: Diagnosis not present

## 2023-03-02 DIAGNOSIS — M6281 Muscle weakness (generalized): Secondary | ICD-10-CM | POA: Diagnosis not present

## 2023-03-02 DIAGNOSIS — R2681 Unsteadiness on feet: Secondary | ICD-10-CM | POA: Diagnosis not present

## 2023-03-02 DIAGNOSIS — M25551 Pain in right hip: Secondary | ICD-10-CM | POA: Diagnosis not present

## 2023-03-05 DIAGNOSIS — M25551 Pain in right hip: Secondary | ICD-10-CM | POA: Diagnosis not present

## 2023-03-05 DIAGNOSIS — R2681 Unsteadiness on feet: Secondary | ICD-10-CM | POA: Diagnosis not present

## 2023-03-05 DIAGNOSIS — M6281 Muscle weakness (generalized): Secondary | ICD-10-CM | POA: Diagnosis not present

## 2023-03-07 DIAGNOSIS — R2681 Unsteadiness on feet: Secondary | ICD-10-CM | POA: Diagnosis not present

## 2023-03-07 DIAGNOSIS — M25551 Pain in right hip: Secondary | ICD-10-CM | POA: Diagnosis not present

## 2023-03-07 DIAGNOSIS — M6281 Muscle weakness (generalized): Secondary | ICD-10-CM | POA: Diagnosis not present

## 2023-03-12 DIAGNOSIS — R2681 Unsteadiness on feet: Secondary | ICD-10-CM | POA: Diagnosis not present

## 2023-03-12 DIAGNOSIS — M25551 Pain in right hip: Secondary | ICD-10-CM | POA: Diagnosis not present

## 2023-03-12 DIAGNOSIS — M6281 Muscle weakness (generalized): Secondary | ICD-10-CM | POA: Diagnosis not present

## 2023-03-14 DIAGNOSIS — R2681 Unsteadiness on feet: Secondary | ICD-10-CM | POA: Diagnosis not present

## 2023-03-14 DIAGNOSIS — M6281 Muscle weakness (generalized): Secondary | ICD-10-CM | POA: Diagnosis not present

## 2023-03-14 DIAGNOSIS — M25551 Pain in right hip: Secondary | ICD-10-CM | POA: Diagnosis not present

## 2023-03-15 ENCOUNTER — Ambulatory Visit (INDEPENDENT_AMBULATORY_CARE_PROVIDER_SITE_OTHER): Payer: Medicare Other | Admitting: Psychiatry

## 2023-03-15 ENCOUNTER — Encounter: Payer: Self-pay | Admitting: Psychiatry

## 2023-03-15 DIAGNOSIS — Z79899 Other long term (current) drug therapy: Secondary | ICD-10-CM | POA: Diagnosis not present

## 2023-03-15 DIAGNOSIS — G251 Drug-induced tremor: Secondary | ICD-10-CM

## 2023-03-15 DIAGNOSIS — F5105 Insomnia due to other mental disorder: Secondary | ICD-10-CM

## 2023-03-15 DIAGNOSIS — E538 Deficiency of other specified B group vitamins: Secondary | ICD-10-CM | POA: Diagnosis not present

## 2023-03-15 DIAGNOSIS — G3184 Mild cognitive impairment, so stated: Secondary | ICD-10-CM | POA: Diagnosis not present

## 2023-03-15 DIAGNOSIS — F331 Major depressive disorder, recurrent, moderate: Secondary | ICD-10-CM | POA: Diagnosis not present

## 2023-03-15 NOTE — Progress Notes (Signed)
Chase Mathis [id number] [birth year] 89 y.o.  Subjective:   Patient ID:  Chase Mathis is a 89 y.o. (DOB 03-Sep-1933) male.  Chief Complaint:  Chief Complaint  Patient presents with   Follow-up   Depression   Medication Reaction    Depression        Associated symptoms include fatigue.  Associated symptoms include no decreased concentration and no suicidal ideas.  Chase Mathis presents to the office today for follow-up of recurrence of depression that occurred at the end of 2019.  He responded to an increase in lithium from 300 to 450 mg daily..  Dr. Valentina Mathis had about a 4-year history of treatment resistant major depression that ultimately responded to ECT in 2009.  He had an early relapse while on nortriptyline alone and lithium was added and he had booster ECTs and has been in remission since that time.  seen in November 2019.  His depression had gone into remission again with increase in lithium from 300 to 450 mg daily.  No med changes were made at that time.d  02/2019 appt : Continue lithium 150 mg capsules 3 daily Continue nortriptyline 50 mg capsules 2 nightly  11/30/20 lab and TC ;His nortriptyline level is 303 and should be 150 or lower.  He is on 2 of the 50 mg capsules.  Tell him to cut the dose back to one of the 50 mg capsules.  He has an appointment in August.  Let them know if he has any problem with the dosage reduction and to let us know.  02/10/2021 appointment following noted: No problems with less nortriptyline to 50 mg nightly. Fine until 2-3 mos ago.  Was working in back yard and got weak and fell and couldn't get up.  Hasn't happened again. Saw neuro Dr. Arbutus Mathis. No PD and probably lithium tremor.  Equivical DaTscan. Had neuropsych testing and dx with [facility]. No further episodes of this.   I'm fine now.  Remained in remission.  Patient reports stable mood and denies depressed or irritable moods.  Patient denies any recent difficulty with anxiety.  Patient denies difficulty  with sleep initiation but occ maintenance with work dreams with anxiety.  Benadryl helps. Denies appetite disturbance.  Patient reports that energy and motivation have been good.  Patient denies any difficulty with concentration.  Patient denies any suicidal ideation. Minimal tremor.  04/25/2021 appointment with the following noted: Did not get lithium level and labs as requested. Moved [facility], [facility].  Didn't think it would be a problem.  Small place.  Younger than others.He doesn't like it bc of the change similar to what he had after moving to [place] years ago leading to a severe prolonged depression.  Feels he made a mistake but trying to ease up on his thinking bc he realizes it's not all about him. Dispersed his furniture.  Thinks he's adjusting better.   Once weekly goes to [employer]'s [employer] mixed [employer].  Involved in other things.   Plan: Continue lithium 150 mg capsules 3 daily Continue nortriptyline 50 mg capsules 1 nightly Check lithium level  09/12/2021 phone call wanting to be worked in sooner due to anxiety.  09/15/2021 appointment with the following noted: seen with wife Chase Mathis Nortriptyline level ordered February 10, 2021 never completed Moved to [facility] several months ago and having trouble adjusting despite 6 years. Can't sleep and trouble with anxiety.  Trouble adjusting.  Can't shut his mind off.   Most stressful  is per wife it's crowded and small space.  She feels the same way about it.  She also feels it is a small space also.   She is struggling with some depression about it. Too. Living at [facility].   He needs an office and a place to think and has an office. Usually to gym several times a week and walking with wife a couple of times per week.   Feels a need to make the place work.   Did this mainly to please the kids.   Consistent with nortriptyline 50 mg HS and lithium 450 but wife says she thinks he misses some of it. No SE. Mainly initial  insomnia.  2-3  hours nightly.Naps an hour and she thinks he might nap a little more. Active at church and in choir.  First [employer]. Gradually harder to do things.   Plan: Continue lithium 150 mg capsules 3 daily Continue nortriptyline 50 mg capsules 1 nightly Check lithium and nortriptyline levels. Needs something for sleep.  Lorazepam 0.5-1.0  mg HS  09/16/2021 lab note: Patient has relapsed with depression and anxiety and this is related to insufficient blood levels of his meds due to noncompliance: His nortriptyline level is less than 20 which means it is not detectable with a prescribed dose of 50 mg nightly.  In June 2020 his nortriptyline level was 303 on 100 mg daily and we reduced it to 50 mg daily.  Optimal levels are 50 to 150 ng/ml  This is highly suggestive that he is not taking nortriptyline on a regular basis.  He is missing a number of dosages.  At his last appointment his wife indicated she felt he was missing significant dosages.   His lithium level was also low and that was recently increased from 3 of the 150 mg capsules to 4 of the 150 mg capsules.  I think it was low because he was missing dosages of that as well.  He needs to use a pillbox to improve compliance.  He will not get better if he does not take his medicine. Tell him to increase nortriptyline to 75 mg at night.  I will send in prescription for 3 of the 25 mg capsules. Continue lithium 600 mg nightly.  Repeat his blood levels after about a week. I sent in a orders to [facility]. He can use the lorazepam 0.5 mg 1 twice daily for anxiety and 1-2 at night for sleep.  But this will not solve his symptoms until he is more compliant with nortriptyline and lithium as indicated in the other note sent today.  10/05/21 lab noted: On nortriptyline 75 mg nightly level was still low at 49.  Therefore nortriptyline increased to 100 mg nightly.  He needs to be very compliant with this and the lithium level which  had also been low.  10/14/2021 appointment with the following noted:  with wife Chase Mathis I'm better.  Rx lorazepam helped improve sleep taking it BID Disc noncompliance which led to relapse. Wife and D in law are making sure he is taking the meds. But not using the pill box. Admits he was neglecting himself and doesn't know why that was necessary.   Feels much better.  Goes to gym and wants to do it again and didn't want to do it when depressed.   Sleep and less anxious.  More interest in things.  More enjoyment. Increased tremor and dropping some pills Appetite had gone down over a couple  of mos and lost 20# but appetite has returned. Wife notices he is having some mild inattentiveness and forgetfulness.  11/29/21 appt noted: Tolerated in crease meds. Ativan helped sleep and insomnia triggered the problems . Lihtium tremor is mild Sing in choir. More consistent with meds and no mistakes. Out of the depression and anxiety Plan: Continue lithium 150 mg capsules 4 daily Continue nortriptyline 75 mg capsules 1 nightly Check lithium and nortriptyline levels again.   06/14/22 appt noted: Normal B12 in March 2023 Dr. Arbutus Mathis dx periperal neuropathy and likely PD and also ikely lithium tremor.  Sinemet hasn't helped tremor. Not a severe tremor but is annoying. Mood is good and Chase Mathis agrees .  Not that anxious and hasn't needed lorazepam.   Sleep is good.   Consistent. [facility] resident. Plan: much better again with consistency of : Continue lithium 150 mg capsules 4 daily Continue nortriptyline 75 mg capsules 1 nightly Check lithium and nortriptyline levels again.  [facility] sent to [facility].  Discussed the risk of toxicity due to recent unstable blood levels.  Will try to reduce if possible.  08/03/22 TC wife:  Chase Mathis, wife, asks if he can come off of the lorazepam? He is too sleepy and sleeping way too much. Gets up early and is going back to bed at 8 or 9 and sleeping a few hours again.     MD resp:  I thought I sent a response to this but tell her or him to cut the dose by 50% for 2 weeks and see how he does.  If he's slepeing ok then stop it.     09/13/22 appt noted; "I'm fine".  Still some tremor.  No recent falls.  Exercising.   Seeing Dr. Arbutus Mathis.  Concerns about possible PD and has peripheral neuropathy.  Concerns about this but not overly worried.  Sleeping ok.  No problems off lorazpem. Attends  [employer].    01/04/23 appt noted:89 yo and tremor more More difficulty sleeping thinking of stupid things. Has awoken thinking of surgery . Gradually getting better with dep and anxiety but did have more sx in the last couple of mos. Can't let go of bad stuff but no reason to hang on to it is part of the problem. Plan: Increase nortriptyline to 100 mg nightly  (4 of the 25 mg capsules or 2 of the 50 mg capsules nightly) Reduce lithium to 3 of the 150 mg capsules which should help the tremor B 6 vitamin 250 mg twice daily to see if tremor is better.  Try a month and if not better stop it.  02/05/23 appt noted: Tremor better with less lithium  to 450 mg .  Pretty much gone. Hasn't started B6. Thinks he is fine now with mood and anxiety.   Total 3 episodes depression and out of it again.  Last one is the most minor.   Some balance issues.  Still singing at church.  No sig SE nortriptyline.  Not aware of anything.   Wife also sees him better. Plan: continue nortriptyline 100 mg HS and lithium 450 HS.  Asks for prn lorazepam for sleep 0.5 mg HS  03/15/23 appt noted: Hip pain and using cane. Meds: continue nortriptyline 100 mg HS and lithium 450 HS.  Asks for prn lorazepam for sleep 0.5 mg HS or anxiety, B 6 250 mg BID for tremor. Tremor is better.  Not gone.  Doing good with dep. Aging issues with pain and some  forgetfulness.   Stress having to support older son who is not consistent with work. Happy with [facility].  Good senior community.  Going to PT 3  times weekly.     Past Psychiatric Medication Trials:  ECT Paroxetine 60, duloxetine anxiety, sertraline 3 weeks, Lexapro 10 x 30 days, Effexor 225 Abilify 5 no response Quetiapine Geodon Adderall trazodone November 2018, we reduced the nortriptyline from 150 mg to 100 mg daily and reduced the lithium from 450 mg a day to 300 mg daily because of some tremor and because he wanted to try to reduce the medication.  His blood levels on the higher dosages were lithium 0.7 nortriptyline 103.   On the lower dosages the level in October were:  Li 0.4.  Nortriptyline 224.  Review of Systems:  Review of Systems  Constitutional:  Positive for fatigue.  Cardiovascular:  Negative for palpitations.  Gastrointestinal:  Negative for diarrhea.  Musculoskeletal:  Positive for arthralgias and gait problem.  Neurological:  Negative for dizziness, tremors and weakness.  Psychiatric/Behavioral:  Negative for agitation, behavioral problems, confusion, decreased concentration, dysphoric mood, hallucinations, self-injury, sleep disturbance and suicidal ideas. The patient is not nervous/anxious and is not hyperactive.     Medications: I have reviewed the patient's current medications.  Current Outpatient Medications  Medication Sig Dispense Refill   amLODipine (NORVASC) 5 MG tablet Take 5 mg by mouth daily.     finasteride (PROSCAR) 5 MG tablet Take 1 tablet (5 mg total) by mouth daily. 90 tablet 3   lisinopril (PRINIVIL,ZESTRIL) 10 MG tablet Take 10 mg by mouth daily.     lithium carbonate 150 MG capsule Take 3 capsules (450 mg total) by mouth at bedtime. 270 capsule 0   LORazepam (ATIVAN) 0.5 MG tablet Take 1 tablet (0.5 mg total) by mouth at bedtime as needed for anxiety. 30 tablet 0   nortriptyline (PAMELOR) 50 MG capsule Take 2 capsules (100 mg total) by mouth at bedtime. 180 capsule 0   pyridoxine (B-6) 250 MG tablet Take 250 mg by mouth 2 (two) times daily.     vitamin B-12 (CYANOCOBALAMIN) 50 MCG  tablet Take 50 mcg by mouth daily.     No current facility-administered medications for this visit.    Medication Side Effects: None  Allergies: No Known Allergies  Past Medical History:  Diagnosis Date   Benign non-nodular prostatic hyperplasia with lower urinary tract symptoms 12/21/2014   Chronic sinusitis 08/23/2017   Combined forms of age-related cataract of both eyes 06/15/2016   Elevated prostate specific antigen (PSA) 07/03/2011   Epiretinal membrane, bilateral 06/03/2015   Epistaxis 08/16/2017   History of electroconvulsive therapy    Hypertension    Left ureteral stone 01/28/2015   Lithium-induced tremor    Major depressive disorder in remission    MCI (mild cognitive impairment) with memory loss 01/05/2021   Peripheral neuropathy    Recurrent nephrolithiasis 09/02/2017   Vitreous syneresis of both eyes 06/03/2015    Family History  Problem Relation Age of Onset   Lung cancer Mother    Heart disease Father    Pancreatic cancer Sister    Healthy Son    Healthy Daughter     Social History   Socioeconomic History   Marital status: Married    Spouse name: Not on file   Number of children: Not on file   Years of education: 20   Highest education level: Professional school degree (e.g., MD, DDS, DVM, JD)  Occupational History   Occupation:  retired    Comment: urologist  Tobacco Use   Smoking status: Never   Smokeless tobacco: Never  Substance and Sexual Activity   Alcohol use: Yes    Comment: very rare   Drug use: No   Sexual activity: Yes  Other Topics Concern   Not on file  Social History Narrative   Right handed   Drinks caffeine    Two story home   Social Determinants of Health   Financial Resource Strain: Not on file  Food Insecurity: Not on file  Transportation Needs: Not on file  Physical Activity: Not on file  Stress: Not on file  Social Connections: Unknown (11/07/2021)   [facility]   Social Network     Social Network: Not on file  Intimate Partner Violence: Unknown (09/29/2021)   [facility], [facility]   HITS    Physically Hurt: Not on file    Insult or Talk Down To: Not on file    Threaten Physical Harm: Not on file    Scream or Curse: Not on file    Past Medical History, Surgical history, Social history, and Family history were reviewed and updated as appropriate.   Please see review of systems for further details on the patient's review from today.   Objective:   Physical Exam:  There were no vitals taken for this visit.  Physical Exam Constitutional:      General: He is not in acute distress.    Appearance: He is well-developed.  Musculoskeletal:        General: No deformity.  Neurological:     Mental Status: He is alert and oriented to person, place, and time.     Motor: No tremor.     Coordination: Coordination normal.     Gait: Gait normal.  Psychiatric:        Attention and Perception: Attention and perception normal.        Mood and Affect: Mood is not anxious or depressed. Affect is not blunt or inappropriate.        Speech: Speech normal.        Behavior: Behavior normal.        Thought Content: Thought content normal. Thought content is not delusional. Thought content does not include homicidal or suicidal ideation. Thought content does not include suicidal plan.        Cognition and Memory: Cognition normal.        Judgment: Judgment normal.     Comments: Insight intact. No auditory or visual hallucinations. No delusions.  Depression resolved and upbeat     Lab Review:     Component Value Date/Time   NA 143 06/22/2022 0849   K 4.6 06/22/2022 0849   CL 104 06/22/2022 0849   CO2 22 06/22/2022 0849   GLUCOSE 144 (H) 06/22/2022 0849   GLUCOSE 81 04/18/2018 0937   BUN 19 06/22/2022 0849   CREATININE 1.15 06/22/2022 0849   CREATININE 1.18 (H) 04/18/2018 0937   CALCIUM 9.5 06/22/2022 0849   PROT 7.5 12/19/2016 2211   ALBUMIN 4.4  12/19/2016 2211   AST 29 12/19/2016 2211   ALT 42 12/19/2016 2211   ALKPHOS 54 12/19/2016 2211   BILITOT 0.7 12/19/2016 2211   GFRNONAA 59 (L) 03/25/2019 1101   GFRAA 68 03/25/2019 1101       Component Value Date/Time   WBC 12.5 (H) 12/19/2016 2211   RBC 5.19 12/19/2016 2211   HGB 15.6 12/19/2016 2211  HCT 46.2 12/19/2016 2211   PLT 265 12/19/2016 2211   MCV 89.0 12/19/2016 2211   MCH 30.1 12/19/2016 2211   MCHC 33.8 12/19/2016 2211   RDW 14.1 12/19/2016 2211   LYMPHSABS 1.3 12/19/2016 2211   MONOABS 0.9 12/19/2016 2211   EOSABS 0.1 12/19/2016 2211   BASOSABS 0.0 12/19/2016 2211    Lithium Lvl  Date Value Ref Range Status  12/21/2022 1.0 0.6 - 1.2 mmol/L Final  12/21/22 lithium 1.0 on 600 mg nightly & level 53 on 75 mg HS  06/22/22 nortrip level 32 06/22/22 lithium level 0.6 on 600 mg daily.  11/30/20 lab and TC ;His nortriptyline level is 303 and should be 150 or lower.  He is on 2 of the 50 mg capsules.  05/03/2021 lithium level 0.8 on 450 mg nightly  Normal B12 in March 2023  No results found for: "PHENYTOIN", "PHENOBARB", "VALPROATE", "CBMZ"   .res Assessment: Plan:    Major depressive disorder, recurrent episode, moderate (HCC)  Insomnia due to mental condition  Lithium use  Lithium-induced tremor  Low serum vitamin B12  MCI (mild cognitive impairment) with memory loss  B12 deficiency   30 min face to face time with patient was spent on counseling and coordination of care.  Urgent appt.   March 2023 relapse of depression.  It was discovered by blood levels as well as by history that there was some inconsistency with compliance with both nortriptyline and lithium.  Lithium dosage has been increased and nortriptyline level has been increased. 06/22/22 nortrip level 32 06/22/22 lithium level 0.6 on 600 mg daily. Was Much better, depression resolved, with lithium and nortriptyline after compliant again.  June 2024 relapse again. 11/2022 nortrip level 53  and lithium level 1.0   Seeing Dr. Arbutus Mathis for tremor.  Hx equivocal DAT scan but also likely lithium tremor.He is addressing these issues. But with less lithium the tremor is better and manageable.  Discussed side effects of both these medications. Counseled patient regarding potential benefits, risks, and side effects of lithium to include potential risk of lithium affecting thyroid and renal function.  Discussed need for periodic lab monitoring to determine drug level and to assess for potential adverse effects.  Counseled patient regarding signs and symptoms of lithium toxicity and advised that they notify office immediately or seek urgent medical attention if experiencing these signs and symptoms.  Patient advised to contact office with any questions or concerns.  Better with Increase nortriptyline to 100 mg nightly  (4 of the 25 mg capsules or 2 of the 50 mg capsules nightly) Less tremor with with less lithium to 3 of the 150 mg capsules .  Minimal tremor  B 6 vitamin 250 mg twice daily for tremor.  It is better, but reason likely DT less lithium.  Consider stopping it.  Sleep asked for refill lorazepam.  0.5 mg used rarely.  Discussed side effects of nortriptyline which could include anticholinergic side effects because of his age could include symptoms like confusion.  He has no such side effects at this time.  Contact us if you have the symptoms or any other symptoms such as sedation etc.  B12 level and need for supplements.  He had stopped. History low B12.  Was OK.  Continues gym and church  Follow-up 4 mos  Meredith Staggers MD, DFAPA  Please see After Visit Summary for patient specific instructions.  Future Appointments  Date Time Provider Department Center  07/06/2023  3:30 PM Tat, Octaviano Batty, DO  [facility] None    No orders of the defined types were placed in this encounter.     -------------------------------

## 2023-03-16 DIAGNOSIS — R2681 Unsteadiness on feet: Secondary | ICD-10-CM | POA: Diagnosis not present

## 2023-03-16 DIAGNOSIS — M6281 Muscle weakness (generalized): Secondary | ICD-10-CM | POA: Diagnosis not present

## 2023-03-16 DIAGNOSIS — M25551 Pain in right hip: Secondary | ICD-10-CM | POA: Diagnosis not present

## 2023-03-19 ENCOUNTER — Ambulatory Visit: Payer: Medicare Other | Admitting: Psychiatry

## 2023-03-19 DIAGNOSIS — R2681 Unsteadiness on feet: Secondary | ICD-10-CM | POA: Diagnosis not present

## 2023-03-19 DIAGNOSIS — M6281 Muscle weakness (generalized): Secondary | ICD-10-CM | POA: Diagnosis not present

## 2023-03-19 DIAGNOSIS — M25551 Pain in right hip: Secondary | ICD-10-CM | POA: Diagnosis not present

## 2023-03-21 ENCOUNTER — Other Ambulatory Visit: Payer: Self-pay | Admitting: Psychiatry

## 2023-03-21 ENCOUNTER — Telehealth: Payer: Self-pay | Admitting: Psychiatry

## 2023-03-21 DIAGNOSIS — M6281 Muscle weakness (generalized): Secondary | ICD-10-CM | POA: Diagnosis not present

## 2023-03-21 DIAGNOSIS — M25551 Pain in right hip: Secondary | ICD-10-CM | POA: Diagnosis not present

## 2023-03-21 DIAGNOSIS — R2681 Unsteadiness on feet: Secondary | ICD-10-CM | POA: Diagnosis not present

## 2023-03-21 MED ORDER — LORAZEPAM 0.5 MG PO TABS: 0.5000 mg | ORAL_TABLET | Freq: Every evening | ORAL | 2 refills | Status: DC | PRN

## 2023-03-21 NOTE — Telephone Encounter (Signed)
Patient called in stating that he has been taking Lorazepam 0.5mg  and he has accidentally dropped medication down the sink drain. He asked if he could get a prescription sent to [facility] [address] [place]

## 2023-03-21 NOTE — Telephone Encounter (Signed)
Thank you.  Sent RX lorazepam for early refill.  Patient has no history of abuse or misuse of this medication.

## 2023-03-21 NOTE — Telephone Encounter (Signed)
Please check his last refill of alprazolam

## 2023-03-23 ENCOUNTER — Other Ambulatory Visit: Payer: Self-pay | Admitting: Neurology

## 2023-03-23 ENCOUNTER — Other Ambulatory Visit: Payer: Self-pay | Admitting: Psychiatry

## 2023-03-23 DIAGNOSIS — F3342 Major depressive disorder, recurrent, in full remission: Secondary | ICD-10-CM

## 2023-03-23 NOTE — Telephone Encounter (Signed)
LMOVM for patient. Are we having feeling worse off of the medication or this was just a auto refill?

## 2023-03-26 DIAGNOSIS — M25551 Pain in right hip: Secondary | ICD-10-CM | POA: Diagnosis not present

## 2023-03-26 DIAGNOSIS — M6281 Muscle weakness (generalized): Secondary | ICD-10-CM | POA: Diagnosis not present

## 2023-03-26 DIAGNOSIS — R2681 Unsteadiness on feet: Secondary | ICD-10-CM | POA: Diagnosis not present

## 2023-03-28 DIAGNOSIS — R2681 Unsteadiness on feet: Secondary | ICD-10-CM | POA: Diagnosis not present

## 2023-03-28 DIAGNOSIS — M6281 Muscle weakness (generalized): Secondary | ICD-10-CM | POA: Diagnosis not present

## 2023-03-28 DIAGNOSIS — M25551 Pain in right hip: Secondary | ICD-10-CM | POA: Diagnosis not present

## 2023-04-02 DIAGNOSIS — R2681 Unsteadiness on feet: Secondary | ICD-10-CM | POA: Diagnosis not present

## 2023-04-02 DIAGNOSIS — M6281 Muscle weakness (generalized): Secondary | ICD-10-CM | POA: Diagnosis not present

## 2023-04-02 DIAGNOSIS — M25551 Pain in right hip: Secondary | ICD-10-CM | POA: Diagnosis not present

## 2023-04-04 ENCOUNTER — Telehealth: Payer: Self-pay | Admitting: Psychiatry

## 2023-04-04 ENCOUNTER — Other Ambulatory Visit: Payer: Self-pay | Admitting: Psychiatry

## 2023-04-04 DIAGNOSIS — M6281 Muscle weakness (generalized): Secondary | ICD-10-CM | POA: Diagnosis not present

## 2023-04-04 DIAGNOSIS — R2681 Unsteadiness on feet: Secondary | ICD-10-CM | POA: Diagnosis not present

## 2023-04-04 DIAGNOSIS — M25551 Pain in right hip: Secondary | ICD-10-CM | POA: Diagnosis not present

## 2023-04-04 NOTE — Telephone Encounter (Signed)
He called [place] about this on 03/21/2023.  I sent in a prescription at that time.  PDMP says he has not picked it up.  It should be waiting for him at the pharmacy.  I did send in that prescription so it should be available.

## 2023-04-04 NOTE — Telephone Encounter (Signed)
Next appt is 07/16/23. 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 He is requesting a replacement refill called to:  [id number] [place] - [address] AT [facility] [address] [facility] [address]   Phone: [phone]  Fax: [phone]

## 2023-04-05 NOTE — Telephone Encounter (Signed)
Pt made aware

## 2023-04-08 ENCOUNTER — Other Ambulatory Visit: Payer: Self-pay | Admitting: Psychiatry

## 2023-04-08 DIAGNOSIS — F3342 Major depressive disorder, recurrent, in full remission: Secondary | ICD-10-CM

## 2023-04-09 NOTE — Telephone Encounter (Signed)
Dosage needs to be corrected and quantity corrected

## 2023-04-09 NOTE — Telephone Encounter (Signed)
Rf appropriate; lv 11/06/22; lv 03/15/23; nv 07/16/23-  "Less tremor with with less lithium to 3 of the 150 mg capsules . Minimal tremor"  Does sig need to be changed to 3 capsules at bedtime?

## 2023-04-10 NOTE — Telephone Encounter (Signed)
Sig changed** quantity changed

## 2023-04-12 ENCOUNTER — Ambulatory Visit: Payer: Medicare Other | Admitting: Neurology

## 2023-04-12 DIAGNOSIS — M1611 Unilateral primary osteoarthritis, right hip: Secondary | ICD-10-CM | POA: Diagnosis not present

## 2023-04-13 DIAGNOSIS — R2681 Unsteadiness on feet: Secondary | ICD-10-CM | POA: Diagnosis not present

## 2023-04-13 DIAGNOSIS — M6281 Muscle weakness (generalized): Secondary | ICD-10-CM | POA: Diagnosis not present

## 2023-04-13 DIAGNOSIS — M25551 Pain in right hip: Secondary | ICD-10-CM | POA: Diagnosis not present

## 2023-04-16 DIAGNOSIS — R2681 Unsteadiness on feet: Secondary | ICD-10-CM | POA: Diagnosis not present

## 2023-04-16 DIAGNOSIS — M6281 Muscle weakness (generalized): Secondary | ICD-10-CM | POA: Diagnosis not present

## 2023-04-16 DIAGNOSIS — M25551 Pain in right hip: Secondary | ICD-10-CM | POA: Diagnosis not present

## 2023-04-18 DIAGNOSIS — R2681 Unsteadiness on feet: Secondary | ICD-10-CM | POA: Diagnosis not present

## 2023-04-18 DIAGNOSIS — M25551 Pain in right hip: Secondary | ICD-10-CM | POA: Diagnosis not present

## 2023-04-18 DIAGNOSIS — M6281 Muscle weakness (generalized): Secondary | ICD-10-CM | POA: Diagnosis not present

## 2023-04-20 ENCOUNTER — Observation Stay (HOSPITAL_COMMUNITY): Payer: Medicare Other

## 2023-04-20 ENCOUNTER — Other Ambulatory Visit: Payer: Self-pay

## 2023-04-20 ENCOUNTER — Emergency Department (HOSPITAL_COMMUNITY): Payer: Medicare Other

## 2023-04-20 ENCOUNTER — Encounter (HOSPITAL_COMMUNITY): Payer: Self-pay | Admitting: Emergency Medicine

## 2023-04-20 ENCOUNTER — Observation Stay (HOSPITAL_COMMUNITY)
Admission: EM | Admit: 2023-04-20 | Discharge: 2023-04-21 | Disposition: A | Discharge status: Home / Self Care | Payer: Medicare Other | Attending: Student in an Organized Health Care Education/Training Program | Admitting: Student in an Organized Health Care Education/Training Program

## 2023-04-20 DIAGNOSIS — G459 Transient cerebral ischemic attack, unspecified: Secondary | ICD-10-CM | POA: Diagnosis not present

## 2023-04-20 DIAGNOSIS — R251 Tremor, unspecified: Secondary | ICD-10-CM | POA: Diagnosis not present

## 2023-04-20 DIAGNOSIS — G319 Degenerative disease of nervous system, unspecified: Secondary | ICD-10-CM | POA: Diagnosis not present

## 2023-04-20 DIAGNOSIS — N1831 Chronic kidney disease, stage 3a: Secondary | ICD-10-CM | POA: Diagnosis present

## 2023-04-20 DIAGNOSIS — I6502 Occlusion and stenosis of left vertebral artery: Secondary | ICD-10-CM | POA: Diagnosis not present

## 2023-04-20 DIAGNOSIS — I6521 Occlusion and stenosis of right carotid artery: Secondary | ICD-10-CM | POA: Diagnosis not present

## 2023-04-20 DIAGNOSIS — Z471 Aftercare following joint replacement surgery: Secondary | ICD-10-CM | POA: Diagnosis not present

## 2023-04-20 DIAGNOSIS — I10 Essential (primary) hypertension: Secondary | ICD-10-CM | POA: Diagnosis present

## 2023-04-20 DIAGNOSIS — R4781 Slurred speech: Secondary | ICD-10-CM | POA: Diagnosis not present

## 2023-04-20 DIAGNOSIS — Z79899 Other long term (current) drug therapy: Secondary | ICD-10-CM | POA: Insufficient documentation

## 2023-04-20 DIAGNOSIS — F325 Major depressive disorder, single episode, in full remission: Secondary | ICD-10-CM | POA: Diagnosis present

## 2023-04-20 DIAGNOSIS — E7800 Pure hypercholesterolemia, unspecified: Secondary | ICD-10-CM | POA: Diagnosis present

## 2023-04-20 DIAGNOSIS — I672 Cerebral atherosclerosis: Secondary | ICD-10-CM | POA: Diagnosis not present

## 2023-04-20 DIAGNOSIS — G20C Parkinsonism, unspecified: Secondary | ICD-10-CM | POA: Diagnosis not present

## 2023-04-20 DIAGNOSIS — I129 Hypertensive chronic kidney disease with stage 1 through stage 4 chronic kidney disease, or unspecified chronic kidney disease: Secondary | ICD-10-CM | POA: Diagnosis not present

## 2023-04-20 DIAGNOSIS — I6523 Occlusion and stenosis of bilateral carotid arteries: Secondary | ICD-10-CM | POA: Diagnosis not present

## 2023-04-20 DIAGNOSIS — R5383 Other fatigue: Secondary | ICD-10-CM | POA: Diagnosis not present

## 2023-04-20 DIAGNOSIS — G4700 Insomnia, unspecified: Secondary | ICD-10-CM | POA: Insufficient documentation

## 2023-04-20 LAB — I-STAT CHEM 8, ED
BUN: 23 mg/dL (ref 8–23)
Calcium, Ion: 1.16 mmol/L (ref 1.15–1.40)
Chloride: 106 mmol/L (ref 98–111)
Creatinine, Ser: 1.3 mg/dL — ABNORMAL HIGH (ref 0.61–1.24)
Glucose, Bld: 97 mg/dL (ref 70–99)
HCT: 46 % (ref 39.0–52.0)
Hemoglobin: 15.6 g/dL (ref 13.0–17.0)
Potassium: 4.8 mmol/L (ref 3.5–5.1)
Sodium: 139 mmol/L (ref 135–145)
TCO2: 23 mmol/L (ref 22–32)

## 2023-04-20 LAB — DIFFERENTIAL
Abs Immature Granulocytes: 0.05 10*3/uL (ref 0.00–0.07)
Basophils Absolute: 0.1 10*3/uL (ref 0.0–0.1)
Basophils Relative: 1 %
Eosinophils Absolute: 0.1 10*3/uL (ref 0.0–0.5)
Eosinophils Relative: 1 %
Immature Granulocytes: 0 %
Lymphocytes Relative: 16 %
Lymphs Abs: 2 10*3/uL (ref 0.7–4.0)
Monocytes Absolute: 0.9 10*3/uL (ref 0.1–1.0)
Monocytes Relative: 7 %
Neutro Abs: 9.8 10*3/uL — ABNORMAL HIGH (ref 1.7–7.7)
Neutrophils Relative %: 75 %

## 2023-04-20 LAB — COMPREHENSIVE METABOLIC PANEL
ALT: 20 U/L (ref 0–44)
AST: 19 U/L (ref 15–41)
Albumin: 4.1 g/dL (ref 3.5–5.0)
Alkaline Phosphatase: 55 U/L (ref 38–126)
Anion gap: 8 (ref 5–15)
BUN: 22 mg/dL (ref 8–23)
CO2: 23 mmol/L (ref 22–32)
Calcium: 9.2 mg/dL (ref 8.9–10.3)
Chloride: 107 mmol/L (ref 98–111)
Creatinine, Ser: 1.32 mg/dL — ABNORMAL HIGH (ref 0.61–1.24)
GFR, Estimated: 52 mL/min — ABNORMAL LOW (ref >60)
Glucose, Bld: 94 mg/dL (ref 70–99)
Potassium: 4.5 mmol/L (ref 3.5–5.1)
Sodium: 138 mmol/L (ref 135–145)
Total Bilirubin: 0.7 mg/dL (ref 0.3–1.2)
Total Protein: 6.9 g/dL (ref 6.5–8.1)

## 2023-04-20 LAB — CBC
HCT: 46.3 % (ref 39.0–52.0)
Hemoglobin: 14.7 g/dL (ref 13.0–17.0)
MCH: 30.1 pg (ref 26.0–34.0)
MCHC: 31.7 g/dL (ref 30.0–36.0)
MCV: 94.7 fL (ref 80.0–100.0)
Platelets: 342 10*3/uL (ref 150–400)
RBC: 4.89 MIL/uL (ref 4.22–5.81)
RDW: 13.6 % (ref 11.5–15.5)
WBC: 12.9 10*3/uL — ABNORMAL HIGH (ref 4.0–10.5)
nRBC: 0 % (ref 0.0–0.2)

## 2023-04-20 LAB — ETHANOL: Alcohol, Ethyl (B): <10 mg/dL (ref <10)

## 2023-04-20 LAB — PROTIME-INR
INR: 1.1 (ref 0.8–1.2)
Prothrombin Time: 14.2 s (ref 11.4–15.2)

## 2023-04-20 LAB — APTT: aPTT: 33 s (ref 24–36)

## 2023-04-20 LAB — CBG MONITORING, ED: Glucose-Capillary: 106 mg/dL — ABNORMAL HIGH (ref 70–99)

## 2023-04-20 MED ORDER — VITAMIN B-6 25 MG PO TABS
250.0000 mg | ORAL_TABLET | Freq: Two times a day (BID) | ORAL | Status: DC
  Administered 2023-04-21 (×2): 250 mg via ORAL
  Filled 2023-04-20 (×3): qty 2

## 2023-04-20 MED ORDER — SENNOSIDES-DOCUSATE SODIUM 8.6-50 MG PO TABS: 1.0000 | ORAL_TABLET | Freq: Every evening | ORAL | Status: DC | PRN

## 2023-04-20 MED ORDER — FINASTERIDE 5 MG PO TABS
5.0000 mg | ORAL_TABLET | Freq: Every day | ORAL | Status: DC
  Administered 2023-04-21: 5 mg via ORAL
  Filled 2023-04-20: qty 1

## 2023-04-20 MED ORDER — LITHIUM CARBONATE 300 MG PO CAPS
450.0000 mg | ORAL_CAPSULE | Freq: Every day | ORAL | Status: DC
  Administered 2023-04-21: 450 mg via ORAL
  Filled 2023-04-20 (×2): qty 1

## 2023-04-20 MED ORDER — NORTRIPTYLINE HCL 25 MG PO CAPS: 150.0000 mg | ORAL_CAPSULE | Freq: Every day | ORAL | Status: DC

## 2023-04-20 MED ORDER — IOHEXOL 350 MG/ML SOLN
75.0000 mL | Freq: Once | INTRAVENOUS | Status: AC | PRN
  Administered 2023-04-20: 75 mL via INTRAVENOUS

## 2023-04-20 MED ORDER — CLOPIDOGREL BISULFATE 75 MG PO TABS
75.0000 mg | ORAL_TABLET | Freq: Every day | ORAL | Status: DC
  Administered 2023-04-21: 75 mg via ORAL
  Filled 2023-04-20: qty 1

## 2023-04-20 MED ORDER — ACETAMINOPHEN 325 MG PO TABS: 650.0000 mg | ORAL_TABLET | Freq: Four times a day (QID) | ORAL | Status: DC | PRN

## 2023-04-20 MED ORDER — NORTRIPTYLINE HCL 25 MG PO CAPS
100.0000 mg | ORAL_CAPSULE | Freq: Every day | ORAL | Status: DC
  Administered 2023-04-21: 100 mg via ORAL
  Filled 2023-04-20 (×2): qty 4

## 2023-04-20 MED ORDER — LORAZEPAM 0.5 MG PO TABS: 0.5000 mg | ORAL_TABLET | Freq: Every evening | ORAL | Status: DC | PRN

## 2023-04-20 MED ORDER — ACETAMINOPHEN 650 MG RE SUPP: 650.0000 mg | Freq: Four times a day (QID) | RECTAL | Status: DC | PRN

## 2023-04-20 MED ORDER — SODIUM CHLORIDE 0.9% FLUSH
3.0000 mL | Freq: Once | INTRAVENOUS | Status: AC
  Administered 2023-04-20: 3 mL via INTRAVENOUS

## 2023-04-20 MED ORDER — ENOXAPARIN SODIUM 40 MG/0.4ML IJ SOSY
40.0000 mg | PREFILLED_SYRINGE | INTRAMUSCULAR | Status: DC
  Administered 2023-04-21: 40 mg via SUBCUTANEOUS
  Filled 2023-04-20: qty 0.4

## 2023-04-20 MED ORDER — ASPIRIN 81 MG PO CHEW
81.0000 mg | CHEWABLE_TABLET | Freq: Every day | ORAL | Status: DC
  Administered 2023-04-21: 81 mg via ORAL
  Filled 2023-04-20: qty 1

## 2023-04-20 NOTE — ED Provider Notes (Signed)
[place] EMERGENCY DEPARTMENT AT [facility] Provider Note   CSN: [id number] Arrival date & time: 04/20/23  1531     History  Chief Complaint  Patient presents with   Cerebrovascular Accident    Chase Mathis is a 89 y.o. male with PMH idiopathic peripheral neuropathy, parkinsonism seen by neurology, presenting for strokelike symptoms this morning.  At 7 AM patient had a 30 to 60-minute episode of garbled speech witnessed by his wife.  He denies any vision changes, difficulty swallowing, numbness or tingling, difficulty walking or any event.  He denies history of stroke or similar events.  No cardiac history.  History of hypertension but no diabetes or hyperlipidemia.  Does not take blood thinners or aspirin. Has since returned to baseline.    The history is provided by the patient and medical records.       Home Medications Prior to Admission medications   Medication Sig Start Date End Date Taking? Authorizing Provider  amLODipine (NORVASC) 5 MG tablet Take 5 mg by mouth daily.   Yes [provider]  chlorhexidine (PERIDEX) 0.12 % solution Use as directed 15 mLs in the mouth or throat 2 (two) times daily. 04/09/23  Yes [provider]  finasteride (PROSCAR) 5 MG tablet Take 1 tablet (5 mg total) by mouth daily. 01/29/15  Yes Jethro Bolus, MD  lisinopril (PRINIVIL,ZESTRIL) 10 MG tablet Take 10 mg by mouth daily.   Yes [provider]  lithium carbonate 150 MG capsule Take 3 capsules (450 mg) by mouth at bedtime. 04/10/23  Yes Cottle, Steva Ready., MD  LORazepam (ATIVAN) 0.5 MG tablet Take 1 tablet (0.5 mg total) by mouth at bedtime as needed for anxiety. 03/21/23  Yes Cottle, Steva Ready., MD  nortriptyline (PAMELOR) 50 MG capsule Take 2 capsules (100 mg total) by mouth at bedtime. 03/23/23  Yes Cottle, Steva Ready., MD  pyridoxine (B-6) 250 MG tablet Take 250 mg by mouth 2 (two) times daily.   Yes [provider]  carbidopa-levodopa  (SINEMET IR) 25-100 MG tablet TAKE 1 TABLET BY MOUTH 3 TIMES A DAY (6AM,11AM AND 4PM) Patient not taking: Reported on 04/20/2023 03/26/23   Tat, Octaviano Batty, DO  nortriptyline (PAMELOR) 50 MG capsule Take 2 capsules (100 mg total) by mouth at bedtime. 01/04/23   Cottle, Steva Ready., MD      Allergies    Patient has no known allergies.    Review of Systems   Review of Systems as per HPI above  Physical Exam Updated Vital Signs BP (!) 167/70   Pulse 67   Temp 97.9 F (36.6 C)   Resp 16   Ht 5\' 8"  (1.727 m)   Wt 96 kg   SpO2 100%   BMI 32.18 kg/m  Physical Exam Constitutional:      Appearance: Normal appearance.  HENT:     Head: Normocephalic and atraumatic.     Nose: Nose normal.     Mouth/Throat:     Mouth: Mucous membranes are moist.  Eyes:     Extraocular Movements: Extraocular movements intact.     Pupils: Pupils are equal, round, and reactive to light.  Cardiovascular:     Rate and Rhythm: Normal rate and regular rhythm.     Pulses: Normal pulses.     Heart sounds: Normal heart sounds.  Pulmonary:     Effort: Pulmonary effort is normal.     Breath sounds: Normal breath sounds.  Abdominal:  General: There is no distension.     Palpations: Abdomen is soft.     Tenderness: There is no abdominal tenderness. There is no guarding.  Musculoskeletal:     Cervical back: Neck supple.     Right lower leg: No edema.     Left lower leg: No edema.  Skin:    General: Skin is warm.     Capillary Refill: Capillary refill takes less than 2 seconds.  Neurological:     General: No focal deficit present.     Mental Status: He is alert and oriented to person, place, and time. Mental status is at baseline.     Cranial Nerves: No cranial nerve deficit.     Sensory: No sensory deficit.     Motor: No weakness.     Coordination: Coordination normal.     Gait: Gait normal.     ED Results / Procedures / Treatments   Labs (all labs ordered are listed, but only abnormal results  are displayed) Labs Reviewed  CBC - Abnormal; Notable for the following components:      Result Value   WBC 12.9 (*)    All other components within normal limits  DIFFERENTIAL - Abnormal; Notable for the following components:   Neutro Abs 9.8 (*)    All other components within normal limits  COMPREHENSIVE METABOLIC PANEL - Abnormal; Notable for the following components:   Creatinine, Ser 1.32 (*)    GFR, Estimated 52 (*)    All other components within normal limits  I-STAT CHEM 8, ED - Abnormal; Notable for the following components:   Creatinine, Ser 1.30 (*)    All other components within normal limits  CBG MONITORING, ED - Abnormal; Notable for the following components:   Glucose-Capillary 106 (*)    All other components within normal limits  PROTIME-INR  APTT  ETHANOL    EKG EKG Interpretation Date/Time:  Friday April 20 2023 15:40:09 EDT Ventricular Rate:  65 PR Interval:  200 QRS Duration:  100 QT Interval:  436 QTC Calculation: 453 R Axis:   7  Text Interpretation: Normal sinus rhythm Increased R/S ratio in V1, consider early transition or posterior infarct Abnormal ECG When compared with ECG of 19-Dec-2016 22:15, PREVIOUS ECG IS PRESENT Confirmed by Glyn Ade [phone]) on 04/20/2023 4:40:18 PM  Radiology MR BRAIN WO CONTRAST  Result Date: 04/20/2023 CLINICAL DATA:  TIA, slurred speech, which has resolved EXAM: MRI HEAD WITHOUT CONTRAST TECHNIQUE: Multiplanar, multiecho pulse sequences of the brain and surrounding structures were obtained without intravenous contrast. COMPARISON:  08/12/2022 MRI head, correlation is also made with same day CT head FINDINGS: Brain: No restricted diffusion to suggest acute or subacute infarct. No acute hemorrhage, mass, mass effect, or midline shift. No hydrocephalus or extra-axial collection. Pituitary and craniocervical junction within normal limits. No hemosiderin deposition to suggest remote hemorrhage. Age related cerebral  atrophy, with somewhat disproportionate atrophy in the left parietal lobe, similar to prior ex vacuo dilatation of the ventricles. Scattered T2 hyperintense signal in the periventricular white matter, likely the sequela of chronic small vessel ischemic disease. Dilated perivascular spaces in the bilateral basal ganglia. Vascular: Normal arterial flow voids. Skull and upper cervical spine: Normal marrow signal. Sinuses/Orbits: Mucosal thickening in the inferior right frontal sinus and anterior ethmoid air cells. Status post bilateral lens replacements. Other: Fluid in the right mastoid air cells. IMPRESSION: No acute intracranial process. No evidence of acute or subacute infarct. Electronically Signed   By: Elaina Pattee.D.  On: 04/20/2023 18:53   CT HEAD WO CONTRAST  Result Date: 04/20/2023 CLINICAL DATA:  Transient ischemic attack, slurred speech, which has resolved EXAM: CT HEAD WITHOUT CONTRAST TECHNIQUE: Contiguous axial images were obtained from the base of the skull through the vertex without intravenous contrast. RADIATION DOSE REDUCTION: This exam was performed according to the departmental dose-optimization program which includes automated exposure control, adjustment of the mA and/or kV according to patient size and/or use of iterative reconstruction technique. COMPARISON:  08/12/2022 MRI head, no prior CT head available FINDINGS: Brain: No evidence of acute infarction, hemorrhage, mass, mass effect, or midline shift. No hydrocephalus or extra-axial fluid collection. Age related cerebral volume loss, which is somewhat more advanced in the left parietal lobe. Vascular: No hyperdense vessel. Atherosclerotic calcifications in the intracranial carotid and vertebral arteries. Skull: Negative for fracture or focal lesion. Sinuses/Orbits: Mild mucosal thickening in the inferior right frontal sinus and anterior ethmoid air cells. No acute finding in the orbits. Status post bilateral lens replacements.  Other: The mastoid air cells are well aerated. IMPRESSION: No acute intracranial process. Electronically Signed   By: Wiliam Ke M.D.   On: 04/20/2023 18:50    Procedures Procedures    Medications Ordered in ED Medications  sodium chloride flush (NS) 0.9 % injection 3 mL (3 mLs Intravenous Given 04/20/23 1636)    ED Course/ Medical Decision Making/ A&P Clinical Course as of 04/20/23 2231  Fri Apr 20, 2023  1639 Stable 36 YOM with a chief complaint of speech disruption. No hx of similar. Lasted 30 mins and RTB. Does have a vague history of Parkonsinism without formal dx. [CC]  1800 CT & MRI pending [CC]  1935 Neuro Consult [CC]    Clinical Course User Index [CC] Glyn Ade, MD       ABCD2 Score: 3                         Medical Decision Making Amount and/or Complexity of Data Reviewed Independent Historian: spouse Labs: ordered. Decision-making details documented in ED Course. Radiology: ordered and independent interpretation performed. Decision-making details documented in ED Course. ECG/medicine tests: ordered and independent interpretation performed. Decision-making details documented in ED Course.  Risk OTC drugs. Prescription drug management. Decision regarding hospitalization.   89 year old male with no history of stroke presenting for transient strokelike symptoms of speech changes.  Differential considered includes TIA, stroke, ICH, brain mass, hypoglycemia, electrolyte or metabolic derangement, seizure. Clinically am most concerned for TIA given <60 min speech disturbance with no other symptoms and full return to baseline.  Neurologic exam is normal on arrival here not consistent with ongoing stroke.  Clinically doubt seizure as event was witnessed and there was no seizure activity. Labs obtained and notable for normal glucose, normal electrolytes, creatinine of 1.3 (similar compared to last at 1.15), negative EtOH, normal coags, mild leukocytosis of 12.9 but  normal hemoglobin.  No infectious symptoms to explain the leukocytosis.  Imaging obtained and independently reviewed by me includes a CT head without contrast which shows no acute abnormality and MRI brain without contrast which shows no acute abnormality.  EKG was obtained and independently interpreted by me and is notable for normal sinus rhythm rate of 65, normal axis and intervals, no bundle branch block, no ST segment elevations or depressions, no pathologic T wave versions, no signs of A-fib and no signs of arrhythmia predisposing condition.  ABCD2 score is 3 (age, speech changes, duration of symptoms each 1  point) or 4 (if event was considered 60 min rather than 59 or less). I discussed the patient with neurology given his age. They recommend hospital admission for TIA workup.  Patient was then admitted to internal medicine for further and definitive care.          Final Clinical Impression(s) / ED Diagnoses Final diagnoses:  TIA (transient ischemic attack)    Rx / DC Orders ED Discharge Orders     None         Karmen Stabs, MD 04/20/23 2232    Glyn Ade, MD 04/20/23 2337

## 2023-04-20 NOTE — ED Triage Notes (Signed)
Pt here form home with c/o slurred speech that started this morning and lasted about 30 mins , speech clear on arrival

## 2023-04-20 NOTE — H&P (Incomplete)
Date: 04/21/2023               Patient Name:  Chase Mathis MRN: [id number]  DOB: 1934-05-14 Age / Sex: 89 y.o., male   PCP: Merlene Laughter, MD (Inactive)         Medical Service: Internal Medicine Teaching Service         Attending Physician: Dr. Oswaldo Done, Marquita Palms, *      First Contact: Dr. Kathleen Lime, MD Pager [phone]    Second Contact: Dr. Morene Crocker, MD Pager [phone]         After Hours (After 5p/  First Contact Pager: [phone]  weekends / holidays): Second Contact Pager: [phone]   SUBJECTIVE   Chief Complaint: slurred speech  History of Present Illness: Chase Mathis is a 89 yo male with hypertension, depression,  who presented with 30-60 minutes of slurred speech.  The patient's wife states that the patient woke up around 6 am and an hour later, when she woke up, she saw him standing at the kitchen sink and holding onto the sink so he would not fall. She also noticed his speech was slurred and that generally, he was not acting like himself. The patient told his wife that he did not feel right and that his thighs felt weak.  He denied facial drooping or new arm or leg weakness.  The patient's wife states that his speech was slurred for 30-60 minutes, and his daughter reports that his speech slowly came back to normal after about 4 hours later.   Denies similar symptoms, but has been in PT due to general leg weakness, but notes that the feeling that he was going to fall today was new. He has a history of hip pain, received a cortisone hip injection in about 1 week ago which helped.   Denies any recent illness,vision changes, no fevers, chills, chest pain, SOB, abd pain, n/v/d, dysuria, hematuria   Meds:  Lisinopril 10 mg Amlodipine 5 mg  Finasteride 5 mg  Lithium 450 mg at bedtime Nortiptyline 100 mg.  B6 250 mg BID Lorazepam 0.5 mg at bedtime (usually takes 3/7 days)  Current Meds  Medication Sig   amLODipine (NORVASC) 5 MG tablet Take 5 mg by  mouth daily.   chlorhexidine (PERIDEX) 0.12 % solution Use as directed 15 mLs in the mouth or throat 2 (two) times daily.   finasteride (PROSCAR) 5 MG tablet Take 1 tablet (5 mg total) by mouth daily.   lisinopril (PRINIVIL,ZESTRIL) 10 MG tablet Take 10 mg by mouth daily.   lithium carbonate 150 MG capsule Take 3 capsules (450 mg) by mouth at bedtime.   LORazepam (ATIVAN) 0.5 MG tablet Take 1 tablet (0.5 mg total) by mouth at bedtime as needed for anxiety.   nortriptyline (PAMELOR) 50 MG capsule Take 2 capsules (100 mg total) by mouth at bedtime.   pyridoxine (B-6) 250 MG tablet Take 250 mg by mouth 2 (two) times daily.    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 Boris Lown (previously saw Dr. Boris Lown) Substances: No tobacco use, social alcohol use, no other substance use  Family History:  Mother had lung cancer, sister had pancreatic cancer  Allergies: Allergies as of 04/20/2023   (No Known Allergies)    Review of Systems: A complete ROS was negative except as per HPI.   OBJECTIVE:   Physical Exam: Blood pressure (!) 167/70, pulse 67, temperature 97.9 F (36.6 C), resp. rate 16, height 5\' 8"  (1.727 m), weight 96 kg, SpO2 100%.   Constitutional: well-appearing elderly male sitting in chair, in no acute distress HENT: normocephalic atraumatic, mucous membranes moist Eyes: conjunctiva non-erythematous Neck: supple Cardiovascular:  regular rate and rhythm, systolic murmur appreciated Pulmonary/Chest: normal work of breathing on room air, lungs clear to auscultation bilaterally Abdominal: soft, non-tender, non-distended MSK: normal bulk and tone  Neuro: Mental Status: Patient is awake, alert, oriented to person, place, month, year, and situation.  Patient is able to give a clear and coherent history.  Can spell world backwards, able to answer simple math questions  No signs of aphasia or neglect.  Cranial Nerves: II: Visual Fields are full. Pupils are equal, round, and reactive to light.   III,IV, VI: EOMI without ptosis or diplopia.  V: Facial sensation is symmetric to temperature VII: Facial movement is symmetric.  VIII: hearing is intact to voice X: Uvula is midline and palate elevates symmetrically XI: Shoulder shrug is symmetric. XII: tongue is midline without atrophy or fasciculations.   Motor: Tone is normal. Bulk is normal. 5/5 strength was present in all four extremities.  Rapid alternating movement slowed but equal bilaterally and finger to nose intact. Gait deferred. Sensory: Sensation is symmetric to light touch and pin prick in the arms and legs.  Able to feel vibratory vibratory touch, but unable to tell when it stops.  Stereognosis - able to identify a key correctly.   Deep Tendon Reflexes: 1+ and symmetric in the biceps and patellae.   Cerebellar: Finger-to-nose testing normal bilaterally.  Labs: CBC    Component Value Date/Time   WBC 12.7 (H) 04/21/2023 0251   RBC 4.83 04/21/2023 0251   HGB 14.5 04/21/2023 0251   HCT 45.6 04/21/2023 0251   PLT 340 04/21/2023 0251   MCV 94.4 04/21/2023 0251   MCH 30.0 04/21/2023 0251   MCHC 31.8 04/21/2023 0251   RDW 13.5 04/21/2023 0251   LYMPHSABS 2.0 04/20/2023 1606   MONOABS 0.9 04/20/2023 1606   EOSABS 0.1 04/20/2023 1606   BASOSABS 0.1 04/20/2023 1606     CMP     Component Value Date/Time   NA 139 04/21/2023 0251   NA 143  06/22/2022 0849   K 4.5 04/21/2023 0251   CL 105 04/21/2023 0251   CO2 26 04/21/2023 0251   GLUCOSE 133 (H) 04/21/2023 0251   BUN 22 04/21/2023 0251   BUN 19 06/22/2022 0849   CREATININE 1.25 (H) 04/21/2023 0251   CREATININE 1.18 (H) 04/18/2018 0937   CALCIUM 9.4 04/21/2023 0251   PROT 6.9 04/20/2023 1606   ALBUMIN 4.1 04/20/2023 1606   AST 19 04/20/2023 1606   ALT 20 04/20/2023 1606   ALKPHOS 55 04/20/2023 1606   BILITOT 0.7 04/20/2023 1606   GFRNONAA 56 (L) 04/21/2023 0251   GFRAA 68 03/25/2019 1101    Imaging:  EKG: personally reviewed my interpretation is normal sinus rhythm.  CT of the head without contrast negative for stroke.  MR brain without contrast no evidence of acute infarct.  CT angio head and neck showed stenosis of the right internal carotid artery, left vertebral artery and moderate stenosis of the left ICA.  ASSESSMENT & PLAN:   Assessment & Plan by Problem: Principal Problem:   TIA (transient ischemic attack) Active Problems:   Major depressive disorder in remission   Hypertension   Chase Mathis is a 89 y.o. person living with a history of hypertension, depression who presented with slurred speech and admitted for TIA on hospital day 1.  # Moderate risk TIA # Ischemic vascular disease He is presenting with slurred speech that has started this morning, with resolution of symptoms in less than 3 hours.  No history of stroke.  He has a history of mild leukocytosis, WBCs in the past 5 years has been 12.5-12.9.  On my exam, he is alert and oriented to person, place and time.  No focal weakness was appreciated on neuroexam. Imaging with CT and MRI were negative for acute stroke.  CT angio head and neck showed stenosis of the right internal carotid artery, left vertebral artery and moderate stenosis of left ICA. He has ABCD2 score of 5 putting him at moderate risk.  Patient was admitted for further workup.  -Neuroconsulted, appreciate recs. -Continuous  cardiac monitoring -Echocardiogram -DAPT therapy with aspirin and clopidogrel for 21 days.  Continue aspirin only thereafter. -LDL 84, started on Crestor 20 mg.  -PT/OT.  # Hypertension Blood pressure 168/70.  He is on lisinopril 5 mg and amlodipine 10 mg.  Post TIA, will hold antihypertensives to allow for permissive HTN.  -Hold amlodipine 10 mg -Hold lisinopril 5 mg.  Chronic kidney disease, stage 2 vs 3a Cr 1.20, GFR range 50-60.  -Monitor.  # Depression Stable.  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  He has previously failed treatment with SSRI, SNRI.  Patient had an early relapse while on nortriptyline alone and lithium was added, and he had booster ECT's and have achieved remission ever since.   -Continue lithium 450 mg. -Continue nortriptyline 100 mg.   Stable chronic medical conditions  #Tremors Stable.  - Vitamin B6 250 mg twice daily.  #Insomnia Stable.  -Lorazepam 0.5 mg at bedtime (usually takes 3/7 days)  # BPH Stable.  - Finasteride 5 mg.   Diet: Normal VTE: Enoxaparin IVF: None,None Code: DNR/DNI  Prior to Admission Living Arrangement:  Home, senior living facility . Anticipated Discharge Location: Home Barriers to Discharge: Medical stabilization, PT/OT recommendations.  Dispo: Admit patient to Observation with expected length of stay less than 2 midnights.  Signed:  Laretta Bolster, M.D.  Internal Medicine Resident, PGY-1 [facility]  Pager: [phone] 6:30 AM, 04/21/2023   **Please contact the on call pager after 5 pm and on weekends at [phone].**   04/21/2023, 6:30 AM

## 2023-04-20 NOTE — ED Notes (Signed)
Patient transported to MRI 

## 2023-04-20 NOTE — Hospital Course (Signed)
Mental status: appearance, behavior, and speech are appropriate. Alert and oriented to person, place, time, and situation. Able to answer simple math questions and spell world backwards.   CN: II: vision intact, peripheral fields intact  II, IV, VI: EOMI, PERRL, no ptosis V: Sensation intact bilaterally VII: facial muscles symmetric VIII: hearing intact bilaterally IX, X: Uvula midline on phonation XI: Shoulder shrug symmetric XII: tongue protrudes midline  Motor: No atrophy, 5/5 strength in bilateral upper and lower extremities. Rapid alternating movement and finger to nose intact. Gait deferred  Sensory: *** sharp and dull, light touch, and vibration intact. Stereognosis - able to identify a key ***

## 2023-04-20 NOTE — H&P (Incomplete)
Date: 04/20/2023               Patient Name:  Chase Mathis MRN: [id number]  DOB: 01/18/1934 Age / Sex: 89 y.o., male   PCP: Merlene Laughter, MD (Inactive)         Medical Service: Internal Medicine Teaching Service         Attending Physician: Dr. Oswaldo Done, Marquita Palms, *      First Contact: Dr. Kathleen Lime, MD Pager [phone]    Second Contact: Dr. Morene Crocker, MD Pager [phone]         After Hours (After 5p/  First Contact Pager: [phone]  weekends / holidays): Second Contact Pager: [phone]   SUBJECTIVE   Chief Complaint: slurred speech  History of Present Illness: Chase Mathis is a 89 yo male with hypertension, depression,  who presented with 30-60 minutes of garbled speech.   The patient's wife states that the patient woke up around 6 am and an hour later, when she woke up, she saw him standing at the kitchen sink and holding onto the sink so he would not fall. She also noticed his speech was slurred and that generally, he was not acting like himself. The patient told his wife that he did not feel right and that his thighs felt weak.  He denies facial drooping or new arm or leg weakness.  The patient's wife states that his speech was slurred for 30-60 minutes, and his daughter reports that his speech slowly came back to normal after about 4 hours later.   Denies similar symptoms, but has been in PT due to general leg weakness, but notes that the feeling that he was going to fall today was new. He has a history of hip pain, received a cortisone hip injection in about 1 week ago which helped.   Denies any recent illness,vision changes, no fevers, chills, chest pain, SOB, abd pain, n/v/d, dysuria, hematuria   Meds:  Lisinopril 10 mg Amlodipine 5 mg  Finasteride 5 mg  Lithium 450 mg at bedtime Nortiptyline 150 mg daily (3 50 mg capsules) B6 250 mg BID Lorazepam 0.5 mg at bedtime (usually takes 3/7 days)  Current Meds  Medication Sig  . amLODipine (NORVASC) 5  MG tablet Take 5 mg by mouth daily.  . chlorhexidine (PERIDEX) 0.12 % solution Use as directed 15 mLs in the mouth or throat 2 (two) times daily.  . finasteride (PROSCAR) 5 MG tablet Take 1 tablet (5 mg total) by mouth daily.  Marland Kitchen lisinopril (PRINIVIL,ZESTRIL) 10 MG tablet Take 10 mg by mouth daily.  Marland Kitchen lithium carbonate 150 MG capsule Take 3 capsules (450 mg) by mouth at bedtime.  Marland Kitchen LORazepam (ATIVAN) 0.5 MG tablet Take 1 tablet (0.5 mg total) by mouth at bedtime as needed for anxiety.  . nortriptyline (PAMELOR) 50 MG capsule Take 2 capsules (100 mg total) by mouth at bedtime.  . pyridoxine (B-6) 250 MG tablet Take 250 mg by mouth 2 (two) times daily.    Past Medical History  Past Surgical History:  Procedure Laterality Date  . CYSTOSCOPY WITH RETROGRADE PYELOGRAM, URETEROSCOPY AND STENT PLACEMENT Left 01/28/2015   Procedure: CYSTOSCOPY WITH LEFT RETROGRADE PYELOGRAM, LEFT URETEROSCOPY/STONE BASKETING  AND LEFT URETERAL  STENT PLACEMENT;  Surgeon: Jethro Bolus, MD;  Location: [facility];  Service: Urology;  Laterality: Left;  . 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 Boris Lown (previously saw Dr. Boris Lown) Substances: No tobacco use, social alcohol use, no other substance use  Family History:  Mother had lung cancer, sister had pancreatic cancer  Allergies: Allergies as of 04/20/2023  . (No Known Allergies)    Review of Systems: A complete ROS was negative except as per HPI.   OBJECTIVE:   Physical Exam: Blood pressure (!) 167/70, pulse 67, temperature 97.9 F (36.6 C), resp. rate 16, height 5\' 8"  (1.727 m), weight 96 kg, SpO2 100%.   Constitutional: well-appearing elderly male sitting in chair, in no acute distress HENT: normocephalic atraumatic, mucous membranes moist Eyes: conjunctiva  non-erythematous Neck: supple Cardiovascular: regular rate and rhythm, systolic murmur appreciated Pulmonary/Chest: normal work of breathing on room air, lungs clear to auscultation bilaterally Abdominal: soft, non-tender, non-distended MSK: normal bulk and tone Neurological:  Exam: Current vital signs: BP (!) 167/70   Pulse 67   Temp 98 F (36.7 C)   Resp 16   Ht 5\' 8"  (1.727 m)   Wt 96 kg   SpO2 100%   BMI 32.18 kg/m   Physical Exam  Constitutional: Appears well-developed and well-nourished.  Psych: Affect appropriate to situation Eyes: No scleral injection HENT: No OP obstrucion Head: Normocephalic.  Cardiovascular: Normal rate and regular rhythm.  Respiratory: Effort normal and breath sounds normal to anterior ascultation GI: Soft.  No distension. There is no tenderness.  Skin: WDI  Neuro: Mental Status: Patient is awake, alert, oriented to person, place, month, year, and situation.*** Patient is able to give a clear and coherent history.*** No signs of aphasia or neglect*** Cranial Nerves: II: Visual Fields are full. Pupils are equal, round, and reactive to light.  *** III,IV, VI: EOMI without ptosis or diplopia.  V: Facial sensation is symmetric to temperature VII: Facial movement is symmetric.  VIII: hearing is intact to voice X: Uvula is midline and palate elevates symmetrically XI: Shoulder shrug is symmetric. XII: tongue is midline without atrophy or fasciculations.  Motor: Tone is normal. Bulk is normal. 5/5 strength was present in all four extremities. *** Sensory: Sensation is symmetric to light touch and temperature in the arms and legs.*** Deep Tendon Reflexes: 2+ and symmetric in the biceps and patellae. *** Plantars: Toes are downgoing bilaterally. *** Cerebellar: FNF and HKS are intact bilaterally*** alert & oriented x 3, CN II-XII intact, speech is clear and coherent, 5/5 strength bilateral upper and lower extremities, normal finger to nose,  can spell world backwards, able to answer simple math questions Skin: warm and dry Psych: normal mood and affect  Labs: CBC    Component Value Date/Time   WBC 12.9 (H) 04/20/2023 1606   RBC 4.89 04/20/2023 1606   HGB 15.6 04/20/2023 1615   HCT 46.0 04/20/2023 1615   PLT 342 04/20/2023 1606   MCV 94.7 04/20/2023 1606   MCH 30.1 04/20/2023 1606   MCHC 31.7 04/20/2023 1606   RDW 13.6 04/20/2023 1606   LYMPHSABS 2.0 04/20/2023 1606   MONOABS 0.9 04/20/2023 1606   EOSABS 0.1 04/20/2023 1606   BASOSABS 0.1 04/20/2023 1606     CMP     Component Value Date/Time   NA 139 04/20/2023 1615   NA 143 06/22/2022 0849   K 4.8 04/20/2023 1615   CL 106 04/20/2023 1615   CO2 23 04/20/2023 1606   GLUCOSE 97 04/20/2023 1615   BUN 23 04/20/2023 1615   BUN 19 06/22/2022 0849   CREATININE  1.30 (H) 04/20/2023 1615   CREATININE 1.18 (H) 04/18/2018 0937   CALCIUM 9.2 04/20/2023 1606   PROT 6.9 04/20/2023 1606   ALBUMIN 4.1 04/20/2023 1606   AST 19 04/20/2023 1606   ALT 20 04/20/2023 1606   ALKPHOS 55 04/20/2023 1606   BILITOT 0.7 04/20/2023 1606   GFRNONAA 52 (L) 04/20/2023 1606   GFRAA 68 03/25/2019 1101    Imaging:  EKG: personally reviewed my interpretation is normal sinus rhythm.  ASSESSMENT & PLAN:   Assessment & Plan by Problem: Principal Problem:   TIA (transient ischemic attack) Active Problems:   Major depressive disorder in remission   Hypertension   TIMON FOGLIA is a 89 y.o. person living with a history of hypertension, depression who presented with slurred speech and admitted for TIA on hospital day 0  Moderate risk TIA He is presenting with slurred speech that has started this morning, with resolution of symptoms in less than 3 hours.  No history of stroke.  He has a history of mild leukocytosis, WBCs in the past 5 years has been 12.5-12.9.  On my exam, he is alert and oriented to person, place and time.  No focal weakness was appreciated on neuroexam. Imaging with  CT and MRI were negative for acute stroke.  He has ABCD2 score of 5 putting him at moderate risk.  Patient was admitted for further workup.  -Neuroconsulted, appreciate recs. -Continuous cardiac monitoring -Echocardiogram -Ultrasound of carotids. -DAPT therapy with aspirin and clopidogrel for 21 days. -Check lipids, start stain if LDL> 70. -PT/OT.  Hypertension Blood pressure 168/70.  He is on lisinopril 5 mg and amlodipine 10 mg.  Post TIA, will hold antihypertensives to allow for permissive HTN.  -Hold amlodipine 10 mg -Hold lisinopril 5 mg.   Depression Stable.  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  Patient had an early relapse while on nortriptyline alone and lithium was added, and he had booster ECT's and have achieved remission ever since.   -Continue lithium 450 mg. -Continue nortriptyline 150 mg.   Diet: Normal VTE: DOAC IVF: None,None Code: DNR/DNI  Prior to Admission Living Arrangement:  Home, senior living facility . Anticipated Discharge Location: Home Barriers to Discharge: Medical stabilization, PT/OT recommendations.  Dispo: Admit patient to Observation with expected length of stay less than 2 midnights.  Signed: Laretta Bolster, MD Internal Medicine Resident PGY-3  04/20/2023, 11:44 PM

## 2023-04-21 ENCOUNTER — Observation Stay (HOSPITAL_BASED_OUTPATIENT_CLINIC_OR_DEPARTMENT_OTHER): Payer: Medicare Other

## 2023-04-21 ENCOUNTER — Observation Stay (HOSPITAL_COMMUNITY): Payer: Medicare Other

## 2023-04-21 DIAGNOSIS — I6521 Occlusion and stenosis of right carotid artery: Secondary | ICD-10-CM | POA: Insufficient documentation

## 2023-04-21 DIAGNOSIS — G459 Transient cerebral ischemic attack, unspecified: Secondary | ICD-10-CM | POA: Diagnosis not present

## 2023-04-21 DIAGNOSIS — D72829 Elevated white blood cell count, unspecified: Secondary | ICD-10-CM | POA: Diagnosis not present

## 2023-04-21 DIAGNOSIS — R4781 Slurred speech: Secondary | ICD-10-CM | POA: Diagnosis not present

## 2023-04-21 LAB — CBC
HCT: 45.6 % (ref 39.0–52.0)
Hemoglobin: 14.5 g/dL (ref 13.0–17.0)
MCH: 30 pg (ref 26.0–34.0)
MCHC: 31.8 g/dL (ref 30.0–36.0)
MCV: 94.4 fL (ref 80.0–100.0)
Platelets: 340 10*3/uL (ref 150–400)
RBC: 4.83 MIL/uL (ref 4.22–5.81)
RDW: 13.5 % (ref 11.5–15.5)
WBC: 12.7 10*3/uL — ABNORMAL HIGH (ref 4.0–10.5)
nRBC: 0 % (ref 0.0–0.2)

## 2023-04-21 LAB — URINALYSIS, COMPLETE (UACMP) WITH MICROSCOPIC
Bacteria, UA: NONE SEEN
Bilirubin Urine: NEGATIVE
Glucose, UA: NEGATIVE mg/dL
Hgb urine dipstick: NEGATIVE
Ketones, ur: NEGATIVE mg/dL
Leukocytes,Ua: NEGATIVE
Nitrite: NEGATIVE
Protein, ur: NEGATIVE mg/dL
Specific Gravity, Urine: 1.027 (ref 1.005–1.030)
pH: 5 (ref 5.0–8.0)

## 2023-04-21 LAB — LIPID PANEL
Cholesterol: 149 mg/dL (ref 0–200)
HDL: 40 mg/dL — ABNORMAL LOW (ref >40)
LDL Cholesterol: 84 mg/dL (ref 0–99)
Total CHOL/HDL Ratio: 3.7 {ratio}
Triglycerides: 126 mg/dL (ref <150)
VLDL: 25 mg/dL (ref 0–40)

## 2023-04-21 LAB — BASIC METABOLIC PANEL
Anion gap: 8 (ref 5–15)
BUN: 22 mg/dL (ref 8–23)
CO2: 26 mmol/L (ref 22–32)
Calcium: 9.4 mg/dL (ref 8.9–10.3)
Chloride: 105 mmol/L (ref 98–111)
Creatinine, Ser: 1.25 mg/dL — ABNORMAL HIGH (ref 0.61–1.24)
GFR, Estimated: 56 mL/min — ABNORMAL LOW (ref >60)
Glucose, Bld: 133 mg/dL — ABNORMAL HIGH (ref 70–99)
Potassium: 4.5 mmol/L (ref 3.5–5.1)
Sodium: 139 mmol/L (ref 135–145)

## 2023-04-21 LAB — AMMONIA: Ammonia: 23 umol/L (ref 9–35)

## 2023-04-21 LAB — ECHOCARDIOGRAM COMPLETE
AV Mean grad: 10 mm[Hg]
AV Peak grad: 19.2 mm[Hg]
Ao pk vel: 2.19 m/s
Area-P 1/2: 3.6 cm2
Height: 68 in
P 1/2 time: 453 ms
S' Lateral: 2.8 cm
Weight: 3386.27 [oz_av]

## 2023-04-21 LAB — HEMOGLOBIN A1C
Hgb A1c MFr Bld: 5.9 % — ABNORMAL HIGH (ref 4.8–5.6)
Mean Plasma Glucose: 122.63 mg/dL

## 2023-04-21 LAB — LITHIUM LEVEL: Lithium Lvl: 0.55 mmol/L — ABNORMAL LOW (ref 0.60–1.20)

## 2023-04-21 MED ORDER — ROSUVASTATIN CALCIUM 20 MG PO TABS: 20.0000 mg | ORAL_TABLET | Freq: Every day | ORAL | Status: DC

## 2023-04-21 MED ORDER — ROSUVASTATIN CALCIUM 20 MG PO TABS: 20.0000 mg | ORAL_TABLET | Freq: Every day | ORAL | 3 refills | Status: AC

## 2023-04-21 MED ORDER — ASPIRIN 81 MG PO CHEW: 81.0000 mg | CHEWABLE_TABLET | Freq: Every day | ORAL | Status: AC

## 2023-04-21 MED ORDER — CLOPIDOGREL BISULFATE 75 MG PO TABS: 75.0000 mg | ORAL_TABLET | Freq: Every day | ORAL | 0 refills | Status: AC

## 2023-04-21 MED ORDER — PERFLUTREN LIPID MICROSPHERE
3.0000 mL | INTRAVENOUS | Status: AC | PRN
  Administered 2023-04-21: 3 mL via INTRAVENOUS

## 2023-04-21 NOTE — Plan of Care (Signed)
PT alert and oriented x 4. Denies any pain. Continent of bowel and bladder. Ambulating in room. IV was patent and saline locked. IV removed for discharge. Discharge instructions completed with pt, wife and daughter. All questions and concerns answered. Pt stable upon discharge.  Problem: Education: Goal: Knowledge of General Education information will improve Description: Including pain rating scale, medication(s)/side effects and non-pharmacologic comfort measures Outcome: Completed/Met   Problem: Health Behavior/Discharge Planning: Goal: Ability to manage health-related needs will improve Outcome: Completed/Met   Problem: Clinical Measurements: Goal: Ability to maintain clinical measurements within normal limits will improve Outcome: Completed/Met Goal: Will remain free from infection Outcome: Completed/Met Goal: Diagnostic test results will improve Outcome: Completed/Met Goal: Respiratory complications will improve Outcome: Completed/Met Goal: Cardiovascular complication will be avoided Outcome: Completed/Met   Problem: Activity: Goal: Risk for activity intolerance will decrease Outcome: Completed/Met   Problem: Nutrition: Goal: Adequate nutrition will be maintained Outcome: Completed/Met   Problem: Coping: Goal: Level of anxiety will decrease Outcome: Completed/Met   Problem: Elimination: Goal: Will not experience complications related to bowel motility Outcome: Completed/Met Goal: Will not experience complications related to urinary retention Outcome: Completed/Met   Problem: Pain Management: Goal: General experience of comfort will improve Outcome: Completed/Met   Problem: Safety: Goal: Ability to remain free from injury will improve Outcome: Completed/Met   Problem: Skin Integrity: Goal: Risk for impaired skin integrity will decrease Outcome: Completed/Met

## 2023-04-21 NOTE — Discharge Instructions (Addendum)
To Mr. BRODERICK COLLAMORE or their caretakers,  They were admitted to [facility] on 04/20/2023 for evaluation and treatment of:  Principal Problem:   TIA (transient ischemic attack) Active Problems:   Hypertension   Pure hypercholesterolemia   Stage 3a chronic kidney disease (HCC)   Internal carotid artery stenosis, right  Resolved Problems:   * No resolved hospital problems. *  The evaluation suggested transient ischemic attack. They were treated with aspirin, Plavix, and statin therapy.  They were discharged from the hospital on 04/21/23. I recommend the following after leaving the hospital:   Start taking aspirin 81 mg daily.  Start taking clopidogrel 75 mg daily for the next 3 weeks, then stop.  Start rosuvastatin 20 mg daily.  Follow-up with your primary care doctor.  Follow-up with the vascular surgeons for more information about surgery for your carotid artery stenosis.  Our therapists have recommended a course of therapy at home for you, a referral has been placed.  For questions about your care plan, until you are able to see your primary doctor: Call [phone]. Dial 0 for the operator. Ask for the internal medicine resident on call.  Marrianne Mood MD 04/21/2023, 6:36 PM

## 2023-04-21 NOTE — Evaluation (Signed)
Physical Therapy Evaluation Patient Details Name: Chase Mathis MRN: [id number] DOB: 10/02/1933 Today's Date: 04/21/2023  History of Present Illness  Pt is an 89 y.o. male who presented to ED with c/o slurred speech, which has now resolved. MRI negative. Admitted with dx of TIA. PMH: MDD, HTN, OA   Clinical Impression  PT eval complete. He demo mod I bed mobility and transfers. Supervision amb 150' without AD. Pt reports all symptoms have resolved. LE strength symmetrical. Pt educated on BEFAST. No further acute care PT indicated. Pt active with [facility]PT at [facility] on admission. Recommend resume services upon d/c. PT signing off.         If plan is discharge home, recommend the following:     Can travel by private vehicle        Equipment Recommendations None recommended by PT  Recommendations for Other Services       Functional Status Assessment Patient has not had a recent decline in their functional status     Precautions / Restrictions Precautions Precautions: Fall      Mobility  Bed Mobility Overal bed mobility: Modified Independent                  Transfers Overall transfer level: Modified independent                      Ambulation/Gait Ambulation/Gait assistance: Supervision Gait Distance (Feet): 150 Feet Assistive device: None Gait Pattern/deviations: Step-through pattern, Decreased stride length, Shuffle Gait velocity: decreased Gait velocity interpretation: 1.31 - 2.62 ft/sec, indicative of limited community ambulator   General Gait Details: short shuffle steps, mildly guarded  Stairs            Wheelchair Mobility     Tilt Bed    Modified Rankin (Stroke Patients Only) Modified Rankin (Stroke Patients Only) Pre-Morbid Rankin Score: Slight disability Modified Rankin: Slight disability     Balance Overall balance assessment: Needs assistance Sitting-balance support: No upper extremity supported, Feet unsupported Sitting  balance-Leahy Scale: Good     Standing balance support: No upper extremity supported, During functional activity Standing balance-Leahy Scale: Fair Standing balance comment: unable to accept challenges                             Pertinent Vitals/Pain Pain Assessment Pain Assessment: Faces Faces Pain Scale: Hurts a little bit Pain Location: hip (recent cortizone injection) Pain Descriptors / Indicators: Guarding Pain Intervention(s): Monitored during session    Home Living Family/patient expects to be discharged to:: Private residence Living Arrangements: Spouse/significant other Available Help at Discharge: Family;Available 24 hours/day Type of Home: Independent living facility [facility] Home Access: Level entry       Home Layout: One level Home Equipment: Cane - single point;Shower seat - built in;Grab bars - toilet;Grab bars - tub/shower Additional Comments: Active with HHPT on admission.    Prior Function Prior Level of Function : Independent/Modified Independent             Mobility Comments: Amb with cane. Goes to dining room for 1 meal/day. ADLs Comments: mod I basic ADLs. No longer driving.     Extremity/Trunk Assessment   Upper Extremity Assessment Upper Extremity Assessment: Overall WFL for tasks assessed    Lower Extremity Assessment Lower Extremity Assessment: Generalized weakness    Cervical / Trunk Assessment Cervical / Trunk Assessment: Kyphotic  Communication   Communication Communication: No apparent difficulties  Cognition Arousal: Alert  Behavior During Therapy: WFL for tasks assessed/performed Overall Cognitive Status: Within Functional Limits for tasks assessed                                          General Comments General comments (skin integrity, edema, etc.): Pt educated on BEFAST.    Exercises     Assessment/Plan    PT Assessment All further PT needs can be met in the next venue of care  PT  Problem List Decreased balance;Pain;Decreased mobility;Decreased activity tolerance       PT Treatment Interventions      PT Goals (Current goals can be found in the Care Plan section)  Acute Rehab PT Goals Patient Stated Goal: home PT Goal Formulation: All assessment and education complete, DC therapy    Frequency       Co-evaluation               AM-PAC PT "6 Clicks" Mobility  Outcome Measure Help needed turning from your back to your side while in a flat bed without using bedrails?: None Help needed moving from lying on your back to sitting on the side of a flat bed without using bedrails?: None Help needed moving to and from a bed to a chair (including a wheelchair)?: None Help needed standing up from a chair using your arms (e.g., wheelchair or bedside chair)?: None Help needed to walk in hospital room?: A Little Help needed climbing 3-5 steps with a railing? : A Little 6 Click Score: 22    End of Session   Activity Tolerance: Patient tolerated treatment well Patient left: in bed;with call bell/phone within reach Nurse Communication: Mobility status PT Visit Diagnosis: Difficulty in walking, not elsewhere classified (R26.2)    Time: 4742-5956 PT Time Calculation (min) (ACUTE ONLY): 15 min   Charges:   PT Evaluation $PT Eval Moderate Complexity: 1 Mod   PT General Charges $$ ACUTE PT VISIT: 1 Visit         Ferd Glassing., PT  Office # [phone]   Ilda Foil 04/21/2023, 8:26 AM

## 2023-04-21 NOTE — Consult Note (Addendum)
Hospital Consult    Reason for Consult: Concern for symptomatic right ICA stenosis Requesting Physician: Hospital medicine MRN #:  [id number]  History of Present Illness: This is a 89 y.o. male with history of MDD, hypertension, hyperlipidemia, CKD 3, currently living in assisted living who was admitted with concern for TIA.  CTA was negative for acute stroke, however critical right-sided ICA stenosis was appreciated.  He was evaluated by neurology, and was noted to have a short episode of slurred speech.  Upper extremity weakness was appreciated, however neither the patient nor his wife were sure which side was affected.  Patient on lithium at baseline for depression, and has had episodes similar with a flex and his lithium level.  Neurology asked for vascular surgery consultation to evaluate the right sided critical ICA stenosis with the differential diagnosis including possible TIA.  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  We rediscussed his symptoms which appeared to be very transient and mainly involved with some slurred speech.  Again, the unilateral weakness was noted, but was believed to be on the right side.    Past Medical History:  Diagnosis Date   Benign non-nodular prostatic hyperplasia with lower urinary tract symptoms 12/21/2014   Chronic sinusitis 08/23/2017   Combined forms of age-related cataract of both eyes 06/15/2016   Elevated prostate specific antigen (PSA) 07/03/2011   Epiretinal membrane, bilateral 06/03/2015   Epistaxis 08/16/2017   History of electroconvulsive therapy    Hypertension    Left ureteral stone 01/28/2015   Lithium-induced tremor    Major depressive disorder in remission    MCI (mild cognitive impairment) with memory loss 01/05/2021   Peripheral neuropathy    Recurrent  nephrolithiasis 09/02/2017   Vitreous syneresis of both eyes 06/03/2015    Past Surgical History:  Procedure Laterality Date   CYSTOSCOPY WITH RETROGRADE PYELOGRAM, URETEROSCOPY AND STENT PLACEMENT Left 01/28/2015   Procedure: CYSTOSCOPY WITH LEFT RETROGRADE PYELOGRAM, LEFT URETEROSCOPY/STONE BASKETING  AND LEFT URETERAL  STENT PLACEMENT;  Surgeon: Jethro Bolus, MD;  Location: [facility];  Service: Urology;  Laterality: Left;   SINUS IRRIGATION      No Known Allergies  Prior to Admission medications   Medication Sig Start Date End Date Taking? Authorizing Provider  amLODipine (NORVASC) 5 MG tablet Take 5 mg by mouth daily.   Yes [provider]  chlorhexidine (PERIDEX) 0.12 % solution Use as directed 15 mLs in the mouth or throat 2 (two) times daily. 04/09/23  Yes [provider]  finasteride (PROSCAR) 5 MG tablet Take 1 tablet (5 mg total) by mouth daily. 01/29/15  Yes Jethro Bolus, MD  lisinopril (PRINIVIL,ZESTRIL) 10 MG tablet Take 10 mg by mouth daily.   Yes [provider]  lithium carbonate 150 MG capsule Take 3 capsules (450 mg) by mouth at bedtime. 04/10/23  Yes Cottle, Steva Ready., MD  LORazepam (ATIVAN) 0.5 MG tablet Take 1 tablet (0.5 mg total) by mouth at bedtime as needed for anxiety. 03/21/23  Yes Cottle, Steva Ready., MD  nortriptyline (PAMELOR) 50 MG capsule Take 2 capsules (100 mg total) by mouth at bedtime. 03/23/23  Yes Cottle, Steva Ready., MD  pyridoxine (B-6) 250 MG tablet Take 250 mg by mouth 2 (two) times daily.   Yes [provider]  carbidopa-levodopa (SINEMET IR) 25-100 MG tablet TAKE 1 TABLET BY MOUTH 3 TIMES A DAY (6AM,11AM AND 4PM) Patient not taking: Reported on 04/20/2023 03/26/23   Tat, Octaviano Batty, DO  nortriptyline (PAMELOR) 50 MG capsule Take 2 capsules (100 mg total) by mouth at bedtime. 01/04/23   Cottle, Steva Ready., MD    Social History   Socioeconomic History   Marital status: Married    Spouse name: Not on file    Number of children: Not on file   Years of education: 20   Highest education level: Professional school degree (e.g., MD, DDS, DVM, JD)  Occupational History   Occupation: retired    Comment: urologist  Tobacco Use   Smoking status: Never   Smokeless tobacco: Never  Substance and Sexual Activity   Alcohol use: Yes    Comment: very rare   Drug use: No   Sexual activity: Yes  Other Topics Concern   Not on file  Social History Narrative   Right handed   Drinks caffeine    Two story home   Social Determinants of Health   Financial Resource Strain: Not on file  Food Insecurity: No Food Insecurity (04/21/2023)   Hunger Vital Sign    Worried About Running Out of Food in the Last Year: Never true    Ran Out of Food in the Last Year: Never true  Transportation Needs: No Transportation Needs (04/21/2023)   PRAPARE - Administrator, Civil Service (Medical): No    Lack of Transportation (Non-Medical): No  Physical Activity: Not on file  Stress: Not on file  Social Connections: Unknown (11/07/2021)   Received from [facility]   Social Network    Social Network: Not on file  Intimate Partner Violence: Not At Risk (04/21/2023)   Humiliation, Afraid, Rape, and Kick questionnaire    Fear of Current or Ex-Partner: No    Emotionally Abused: No    Physically Abused: No    Sexually Abused: No   Family History  Problem Relation Age of Onset   Lung cancer Mother    Heart disease Father    Pancreatic cancer Sister    Healthy Son    Healthy Daughter     ROS: Otherwise negative unless mentioned in HPI  Physical Examination  Vitals:   04/21/23 1100 04/21/23 1526  BP: (!) 192/76 (!) 155/63  Pulse: 62 68  Resp: 18 18  Temp: (!) 97.3 F (36.3 C) 97.6 F (36.4 C)  SpO2: 100% 100%   Body mass index is 32.18 kg/m.  General:  WDWN in NAD Gait: Not observed HENT: WNL, normocephalic Pulmonary: normal non-labored breathing, without Rales, rhonchi,   wheezing Cardiac: regular Abdomen: soft, NT/ND, no masses Skin: without rashes Vascular Exam/Pulses: 2+ radials bilaterally Extremities: without ischemic changes, without Gangrene , without cellulitis; without open wounds;  Musculoskeletal: no muscle wasting or atrophy  Neurologic: A&O X 3;  No focal weakness or paresthesias are detected; speech is fluent/normal Psychiatric:  The pt has Normal affect. Lymph:  Unremarkable  CBC    Component Value Date/Time   WBC 12.7 (H) 04/21/2023 0251   RBC 4.83 04/21/2023 0251   HGB 14.5 04/21/2023 0251   HCT 45.6 04/21/2023 0251   PLT 340 04/21/2023 0251   MCV 94.4 04/21/2023 0251   MCH 30.0 04/21/2023 0251   MCHC 31.8 04/21/2023 0251   RDW 13.5 04/21/2023 0251   LYMPHSABS 2.0 04/20/2023 1606   MONOABS 0.9 04/20/2023 1606   EOSABS  0.1 04/20/2023 1606   BASOSABS 0.1 04/20/2023 1606    BMET    Component Value Date/Time   NA 139 04/21/2023 0251   NA 143 06/22/2022 0849   K 4.5 04/21/2023 0251   CL 105 04/21/2023 0251   CO2 26 04/21/2023 0251   GLUCOSE 133 (H) 04/21/2023 0251   BUN 22 04/21/2023 0251   BUN 19 06/22/2022 0849   CREATININE 1.25 (H) 04/21/2023 0251   CREATININE 1.18 (H) 04/18/2018 0937   CALCIUM 9.4 04/21/2023 0251   GFRNONAA 56 (L) 04/21/2023 0251   GFRAA 68 03/25/2019 1101    COAGS: Lab Results  Component Value Date   INR 1.1 04/20/2023     ASSESSMENT/PLAN: This is a 89 y.o. male status post neurological event-I said this because it is unclear whether this was a TIA or not.  I called Dr. Roda Shutters who also felt that TIA cannot be ruled out, but felt there were several other diagnoses on the differential.  I had a long conversation with Connar, his wife and his daughter regarding the above.  I discussed that 80% stenosis, I would offer him elective surgery for even asymptomatic disease.  With asymptomatic carotid disease I quoted him an 11% risk of stroke over the next 5 years with a risk reduction to 5% with  cerebrovascular revascularization.  In the setting of possible stroke, the risk increases dramatically to 26% risk of future stroke in the next 2 years, with risk reduction to 9% with cerebrovascular revascularization.  Tolulope appears to be a healthy 89 year old, who I think has several years to live.  He is interested in the therapy which assumes the lowest risk profile.  With his disease and location of his carotid, we discussed transcarotid artery revascularization.  I quoted him a 2 to 3% risk of stroke due to the atherosclerotic lesion that would probably require lithotripsy intraoperatively.  Clayvon wanted to discuss the risks and benefits with his family further.  My plan is to see him in the outpatient setting this Thursday to discuss whether he would like to treat this surgically or continue medical management.  I discussed this with Dr. Roda Shutters who agrees that either option is reasonable.  Should he choose to pursue surgery, I would treat this lesion is symptomatic and tried to schedule his surgery in the next 2 weeks.   Victorino Sparrow MD [facility] [phone] 04/21/2023  4:57 PM

## 2023-04-21 NOTE — ED Notes (Signed)
ED TO INPATIENT HANDOFF REPORT  ED Nurse Name and Phone #: Beatris Ship RN [phone]  S Name/Age/Gender Chase Mathis 89 y.o. male Room/Bed: 004C/004C  Code Status   Code Status: Limited: Do not attempt resuscitation (DNR) -DNR-LIMITED -Do Not Intubate/DNI   Home/SNF/Other Home Patient oriented to: self, place, time, and situation Is this baseline? Yes   Triage Complete: Triage complete  Chief Complaint TIA (transient ischemic attack) [G45.9]  Triage Note Pt here form home with c/o slurred speech that started this morning and lasted about 30 mins , speech clear on arrival    Allergies No Known Allergies  Level of Care/Admitting Diagnosis ED Disposition     ED Disposition  Admit   Condition  --   Comment  Hospital Area: [facility] [[id number]]  Level of Care: Telemetry Medical [104]  May place patient in observation at [facility] or [facility] if equivalent level of care is available:: No  Covid Evaluation: Asymptomatic - no recent exposure (last 10 days) testing not required  Diagnosis: TIA (transient ischemic attack) [829562]  Admitting Physician: Tyson Alias [[id number]]  Attending Physician: Tyson Alias [[id number]]          B Medical/Surgery History Past Medical History:  Diagnosis Date   Benign non-nodular prostatic hyperplasia with lower urinary tract symptoms 12/21/2014   Chronic sinusitis 08/23/2017   Combined forms of age-related cataract of both eyes 06/15/2016   Elevated prostate specific antigen (PSA) 07/03/2011   Epiretinal membrane, bilateral 06/03/2015   Epistaxis 08/16/2017   History of electroconvulsive therapy    Hypertension    Left ureteral stone 01/28/2015   Lithium-induced tremor    Major depressive disorder in remission    MCI (mild cognitive impairment) with memory loss 01/05/2021   Peripheral neuropathy    Recurrent nephrolithiasis 09/02/2017   Vitreous syneresis of both eyes 06/03/2015   Past  Surgical History:  Procedure Laterality Date   CYSTOSCOPY WITH RETROGRADE PYELOGRAM, URETEROSCOPY AND STENT PLACEMENT Left 01/28/2015   Procedure: CYSTOSCOPY WITH LEFT RETROGRADE PYELOGRAM, LEFT URETEROSCOPY/STONE BASKETING  AND LEFT URETERAL  STENT PLACEMENT;  Surgeon: Jethro Bolus, MD;  Location: [facility];  Service: Urology;  Laterality: Left;   SINUS IRRIGATION       A IV Location/Drains/Wounds Patient Lines/Drains/Airways Status     Active Line/Drains/Airways     Name Placement date Placement time Site Days   Peripheral IV 04/20/23 20 G Right Antecubital 04/20/23  1636  Antecubital  1   Ureteral Drain/Stent Left ureter 6 Fr. 01/28/15  2250  Left ureter  3005            Intake/Output Last 24 hours No intake or output data in the 24 hours ending 04/21/23 1017  Labs/Imaging Results for orders placed or performed during the hospital encounter of 04/20/23 (from the past 48 hour(s))  Protime-INR     Status: None   Collection Time: 04/20/23  4:06 PM  Result Value Ref Range   Prothrombin Time 14.2 11.4 - 15.2 seconds   INR 1.1 0.8 - 1.2    Comment: (NOTE) INR goal varies based on device and disease states. Performed at [facility], [address] [place], [place] [address]   APTT     Status: None   Collection Time: 04/20/23  4:06 PM  Result Value Ref Range   aPTT 33 24 - 36 seconds    Comment: Performed at [facility], [address] [place], [place] [address]  CBC  Status: Abnormal   Collection Time: 04/20/23  4:06 PM  Result Value Ref Range   WBC 12.9 (H) 4.0 - 10.5 K/uL   RBC 4.89 4.22 - 5.81 MIL/uL   Hemoglobin 14.7 13.0 - 17.0 g/dL   HCT 16.1 09.6 - 04.5 %   MCV 94.7 80.0 - 100.0 fL   MCH 30.1 26.0 - 34.0 pg   MCHC 31.7 30.0 - 36.0 g/dL   RDW 40.9 81.1 - 91.4 %   Platelets 342 150 - 400 K/uL   nRBC 0.0 0.0 - 0.2 %    Comment: Performed at [facility], [address] [place], [place] [address]  Differential     Status: Abnormal    Collection Time: 04/20/23  4:06 PM  Result Value Ref Range   Neutrophils Relative % 75 %   Neutro Abs 9.8 (H) 1.7 - 7.7 K/uL   Lymphocytes Relative 16 %   Lymphs Abs 2.0 0.7 - 4.0 K/uL   Monocytes Relative 7 %   Monocytes Absolute 0.9 0.1 - 1.0 K/uL   Eosinophils Relative 1 %   Eosinophils Absolute 0.1 0.0 - 0.5 K/uL   Basophils Relative 1 %   Basophils Absolute 0.1 0.0 - 0.1 K/uL   Immature Granulocytes 0 %   Abs Immature Granulocytes 0.05 0.00 - 0.07 K/uL    Comment: Performed at [facility], [address], [place], [place] [address]  Comprehensive metabolic panel     Status: Abnormal   Collection Time: 04/20/23  4:06 PM  Result Value Ref Range   Sodium 138 135 - 145 mmol/L   Potassium 4.5 3.5 - 5.1 mmol/L   Chloride 107 98 - 111 mmol/L   CO2 23 22 - 32 mmol/L   Glucose, Bld 94 70 - 99 mg/dL    Comment: Glucose reference range applies only to samples taken after fasting for at least 8 hours.   BUN 22 8 - 23 mg/dL   Creatinine, Ser 8.65 (H) 0.61 - 1.24 mg/dL   Calcium 9.2 8.9 - 78.4 mg/dL   Total Protein 6.9 6.5 - 8.1 g/dL   Albumin 4.1 3.5 - 5.0 g/dL   AST 19 15 - 41 U/L   ALT 20 0 - 44 U/L   Alkaline Phosphatase 55 38 - 126 U/L   Total Bilirubin 0.7 0.3 - 1.2 mg/dL   GFR, Estimated 52 (L) >60 mL/min    Comment: (NOTE) Calculated using the CKD-EPI Creatinine Equation (2021)    Anion gap 8 5 - 15    Comment: Performed at [facility], [address] [place], [place] [address]  Ethanol     Status: None   Collection Time: 04/20/23  4:06 PM  Result Value Ref Range   Alcohol, Ethyl (B) <10 <10 mg/dL    Comment: (NOTE) Lowest detectable limit for serum alcohol is 10 mg/dL.  For medical purposes only. Performed at [facility], [address], [place], [place] [address]   I-stat chem 8, ED     Status: Abnormal   Collection Time: 04/20/23  4:15 PM  Result Value Ref Range   Sodium 139 135 - 145 mmol/L   Potassium 4.8 3.5 - 5.1 mmol/L   Chloride 106  98 - 111 mmol/L   BUN 23 8 - 23 mg/dL   Creatinine, Ser 3.24 (H) 0.61 - 1.24 mg/dL   Glucose, Bld 97 70 - 99 mg/dL    Comment: Glucose reference range applies only to samples  taken after fasting for at least 8 hours.   Calcium, Ion 1.16 1.15 - 1.40 mmol/L   TCO2 23 22 - 32 mmol/L   Hemoglobin 15.6 13.0 - 17.0 g/dL   HCT 16.1 09.6 - 04.5 %  CBG monitoring, ED     Status: Abnormal   Collection Time: 04/20/23  4:32 PM  Result Value Ref Range   Glucose-Capillary 106 (H) 70 - 99 mg/dL    Comment: Glucose reference range applies only to samples taken after fasting for at least 8 hours.  Basic metabolic panel     Status: Abnormal   Collection Time: 04/21/23  2:51 AM  Result Value Ref Range   Sodium 139 135 - 145 mmol/L   Potassium 4.5 3.5 - 5.1 mmol/L   Chloride 105 98 - 111 mmol/L   CO2 26 22 - 32 mmol/L   Glucose, Bld 133 (H) 70 - 99 mg/dL    Comment: Glucose reference range applies only to samples taken after fasting for at least 8 hours.   BUN 22 8 - 23 mg/dL   Creatinine, Ser 4.09 (H) 0.61 - 1.24 mg/dL   Calcium 9.4 8.9 - 81.1 mg/dL   GFR, Estimated 56 (L) >60 mL/min    Comment: (NOTE) Calculated using the CKD-EPI Creatinine Equation (2021)    Anion gap 8 5 - 15    Comment: Performed at [facility], [address] [place], [place] [address]  CBC     Status: Abnormal   Collection Time: 04/21/23  2:51 AM  Result Value Ref Range   WBC 12.7 (H) 4.0 - 10.5 K/uL   RBC 4.83 4.22 - 5.81 MIL/uL   Hemoglobin 14.5 13.0 - 17.0 g/dL   HCT 29.5 62.1 - 30.8 %   MCV 94.4 80.0 - 100.0 fL   MCH 30.0 26.0 - 34.0 pg   MCHC 31.8 30.0 - 36.0 g/dL   RDW 65.7 84.6 - 96.2 %   Platelets 340 150 - 400 K/uL   nRBC 0.0 0.0 - 0.2 %    Comment: Performed at [facility], [address], [place], [place] [address]  Hemoglobin A1c     Status: Abnormal   Collection Time: 04/21/23  2:51 AM  Result Value Ref Range   Hgb A1c MFr Bld 5.9 (H) 4.8 - 5.6 %    Comment: (NOTE) Pre diabetes:           5.7%-6.4%  Diabetes:              >6.4%  Glycemic control for   <7.0% adults with diabetes    Mean Plasma Glucose 122.63 mg/dL    Comment: Performed at [facility], [address] [place], [place] [address]  Lipid panel     Status: Abnormal   Collection Time: 04/21/23  2:51 AM  Result Value Ref Range   Cholesterol 149 0 - 200 mg/dL   Triglycerides 010 <272 mg/dL   HDL 40 (L) >53 mg/dL   Total CHOL/HDL Ratio 3.7 RATIO   VLDL 25 0 - 40 mg/dL   LDL Cholesterol 84 0 - 99 mg/dL    Comment:        Total Cholesterol/HDL:CHD Risk Coronary Heart Disease Risk Table                     Men   Women  1/2 Average Risk   3.4   3.3  Average Risk       5.0  4.4  2 X Average Risk   9.6   7.1  3 X Average Risk  23.4   11.0        Use the calculated Patient Ratio above and the CHD Risk Table to determine the patient's CHD Risk.        ATP III CLASSIFICATION (LDL):  <100     mg/dL   Optimal  621-308  mg/dL   Near or Above                    Optimal  130-159  mg/dL   Borderline  657-846  mg/dL   High  >962     mg/dL   Very High Performed at [facility], [address], [place], [place] [address]   Urinalysis, Complete w Microscopic -Urine, Clean Catch     Status: None   Collection Time: 04/21/23  8:49 AM  Result Value Ref Range   Color, Urine YELLOW YELLOW   APPearance CLEAR CLEAR   Specific Gravity, Urine 1.027 1.005 - 1.030   pH 5.0 5.0 - 8.0   Glucose, UA NEGATIVE NEGATIVE mg/dL   Hgb urine dipstick NEGATIVE NEGATIVE   Bilirubin Urine NEGATIVE NEGATIVE   Ketones, ur NEGATIVE NEGATIVE mg/dL   Protein, ur NEGATIVE NEGATIVE mg/dL   Nitrite NEGATIVE NEGATIVE   Leukocytes,Ua NEGATIVE NEGATIVE   RBC / HPF 0-5 0 - 5 RBC/hpf   WBC, UA 0-5 0 - 5 WBC/hpf   Bacteria, UA NONE SEEN NONE SEEN   Squamous Epithelial / HPF 0-5 0 - 5 /HPF    Comment: Performed at [facility], [address] [place], [place] [address]   DG CHEST PORT 1 VIEW  Result Date:  04/21/2023 CLINICAL DATA:  Leukocytosis.  Slurred speech EXAM: PORTABLE CHEST 1 VIEW COMPARISON:  None Available. FINDINGS: Lordotic exam. Patient rotated rightward. Normal cardiac silhouette. No effusion, infiltrate, or pneumothorax. No acute osseous abnormality. IMPRESSION: No acute cardiopulmonary process. Electronically Signed   By: Genevive Bi M.D.   On: 04/21/2023 09:43   CT ANGIO HEAD NECK W WO CM  Result Date: 04/21/2023 CLINICAL DATA:  Slurred speech EXAM: CT ANGIOGRAPHY HEAD AND NECK WITH AND WITHOUT CONTRAST TECHNIQUE: Multidetector CT imaging of the head and neck was performed using the standard protocol during bolus administration of intravenous contrast. Multiplanar CT image reconstructions and MIPs were obtained to evaluate the vascular anatomy. Carotid stenosis measurements (when applicable) are obtained utilizing NASCET criteria, using the distal internal carotid diameter as the denominator. RADIATION DOSE REDUCTION: This exam was performed according to the departmental dose-optimization program which includes automated exposure control, adjustment of the mA and/or kV according to patient size and/or use of iterative reconstruction technique. CONTRAST:  75mL OMNIPAQUE IOHEXOL 350 MG/ML SOLN COMPARISON:  None Available. FINDINGS: CTA NECK FINDINGS SKELETON: No acute abnormality or high grade bony spinal canal stenosis. OTHER NECK: Normal pharynx, larynx and major salivary glands. No cervical lymphadenopathy. Unremarkable thyroid gland. UPPER CHEST: No pneumothorax or pleural effusion. No nodules or masses. AORTIC ARCH: There is calcific atherosclerosis of the aortic arch. Conventional 3 vessel aortic branching pattern. RIGHT CAROTID SYSTEM: No dissection, occlusion or aneurysm. There is mixed density, predominantly calcific atherosclerosis extending into the proximal ICA, resulting in 80% stenosis. LEFT CAROTID SYSTEM: No dissection, occlusion or aneurysm. There is calcified  atherosclerosis extending into the proximal ICA, resulting in less than 50% stenosis. VERTEBRAL ARTERIES: Left dominant configuration. There is severe stenosis of the left vertebral artery origin  due to calcific atherosclerosis. Both vertebral arteries are otherwise normal to the skull base. CTA HEAD FINDINGS POSTERIOR CIRCULATION: --Vertebral arteries: Mild calcific atherosclerosis of the left V4 segment without stenosis. --Inferior cerebellar arteries: Left PICA not clearly visualized. Normal right. --Basilar artery: Normal. --Superior cerebellar arteries: Normal. --Posterior cerebral arteries (PCA): Normal. ANTERIOR CIRCULATION: --Intracranial internal carotid arteries: Bilateral atherosclerotic calcification. Moderate stenosis the supraclinoid left ICA. --Anterior cerebral arteries (ACA): Normal. --Middle cerebral arteries (MCA): Normal. VENOUS SINUSES: As permitted by contrast timing, patent. ANATOMIC VARIANTS: None Review of the MIP images confirms the above findings. IMPRESSION: 1. No emergent large vessel occlusion. 2. Severe stenosis of the left vertebral artery origin due to calcific atherosclerosis. 3. Proximal right internal carotid artery stenosis of 80%. 4. Moderate stenosis of the supraclinoid left ICA due to calcific atherosclerosis. Aortic Atherosclerosis (ICD10-I70.0). Electronically Signed   By: Deatra Robinson M.D.   On: 04/21/2023 00:57   MR BRAIN WO CONTRAST  Result Date: 04/20/2023 CLINICAL DATA:  TIA, slurred speech, which has resolved EXAM: MRI HEAD WITHOUT CONTRAST TECHNIQUE: Multiplanar, multiecho pulse sequences of the brain and surrounding structures were obtained without intravenous contrast. COMPARISON:  08/12/2022 MRI head, correlation is also made with same day CT head FINDINGS: Brain: No restricted diffusion to suggest acute or subacute infarct. No acute hemorrhage, mass, mass effect, or midline shift. No hydrocephalus or extra-axial collection. Pituitary and craniocervical  junction within normal limits. No hemosiderin deposition to suggest remote hemorrhage. Age related cerebral atrophy, with somewhat disproportionate atrophy in the left parietal lobe, similar to prior ex vacuo dilatation of the ventricles. Scattered T2 hyperintense signal in the periventricular white matter, likely the sequela of chronic small vessel ischemic disease. Dilated perivascular spaces in the bilateral basal ganglia. Vascular: Normal arterial flow voids. Skull and upper cervical spine: Normal marrow signal. Sinuses/Orbits: Mucosal thickening in the inferior right frontal sinus and anterior ethmoid air cells. Status post bilateral lens replacements. Other: Fluid in the right mastoid air cells. IMPRESSION: No acute intracranial process. No evidence of acute or subacute infarct. Electronically Signed   By: Wiliam Ke M.D.   On: 04/20/2023 18:53   CT HEAD WO CONTRAST  Result Date: 04/20/2023 CLINICAL DATA:  Transient ischemic attack, slurred speech, which has resolved EXAM: CT HEAD WITHOUT CONTRAST TECHNIQUE: Contiguous axial images were obtained from the base of the skull through the vertex without intravenous contrast. RADIATION DOSE REDUCTION: This exam was performed according to the departmental dose-optimization program which includes automated exposure control, adjustment of the mA and/or kV according to patient size and/or use of iterative reconstruction technique. COMPARISON:  08/12/2022 MRI head, no prior CT head available FINDINGS: Brain: No evidence of acute infarction, hemorrhage, mass, mass effect, or midline shift. No hydrocephalus or extra-axial fluid collection. Age related cerebral volume loss, which is somewhat more advanced in the left parietal lobe. Vascular: No hyperdense vessel. Atherosclerotic calcifications in the intracranial carotid and vertebral arteries. Skull: Negative for fracture or focal lesion. Sinuses/Orbits: Mild mucosal thickening in the inferior right frontal sinus  and anterior ethmoid air cells. No acute finding in the orbits. Status post bilateral lens replacements. Other: The mastoid air cells are well aerated. IMPRESSION: No acute intracranial process. Electronically Signed   By: Wiliam Ke M.D.   On: 04/20/2023 18:50    Pending Labs Unresulted Labs (From admission, onward)     Start     Ordered   04/21/23 0823  Ammonia  Once,   R        04/21/23  5409   04/21/23 0821  Lithium level  Once,   R        04/21/23 0820            Vitals/Pain Today's Vitals   04/21/23 0700 04/21/23 0803 04/21/23 0845 04/21/23 0900  BP: (!) 171/74 (!) 199/79  (!) 167/68  Pulse: 63 75  64  Resp: (!) 24 15  (!) 27  Temp:   97.7 F (36.5 C)   TempSrc:   Oral   SpO2: 97% 98%  99%  Weight:      Height:      PainSc:        Isolation Precautions No active isolations  Medications Medications  enoxaparin (LOVENOX) injection 40 mg (40 mg Subcutaneous Given 04/21/23 0055)  acetaminophen (TYLENOL) tablet 650 mg (has no administration in time range)    Or  acetaminophen (TYLENOL) suppository 650 mg (has no administration in time range)  senna-docusate (Senokot-S) tablet 1 tablet (has no administration in time range)  finasteride (PROSCAR) tablet 5 mg (5 mg Oral Given 04/21/23 0952)  lithium carbonate capsule 450 mg (450 mg Oral Given 04/21/23 0056)  LORazepam (ATIVAN) tablet 0.5 mg (has no administration in time range)  aspirin chewable tablet 81 mg (81 mg Oral Given 04/21/23 0951)  clopidogrel (PLAVIX) tablet 75 mg (75 mg Oral Given 04/21/23 0952)  pyridOXINE (VITAMIN B6) tablet 250 mg (250 mg Oral Given 04/21/23 0952)  nortriptyline (PAMELOR) capsule 100 mg (100 mg Oral Given 04/21/23 0100)  rosuvastatin (CRESTOR) tablet 20 mg (has no administration in time range)  sodium chloride flush (NS) 0.9 % injection 3 mL (3 mLs Intravenous Given 04/20/23 1636)  iohexol (OMNIPAQUE) 350 MG/ML injection 75 mL (75 mLs Intravenous Contrast Given 04/20/23 2353)     Mobility walks     Focused Assessments Neuro Assessment Handoff:  Swallow screen pass? Yes  Cardiac Rhythm: Normal sinus rhythm NIH Stroke Scale  Dizziness Present: No Headache Present: No Interval: Shift assessment Level of Consciousness (1a.)   : Alert, keenly responsive LOC Questions (1b. )   : Answers both questions correctly LOC Commands (1c. )   : Performs both tasks correctly Best Gaze (2. )  : Normal Visual (3. )  : No visual loss Facial Palsy (4. )    : Normal symmetrical movements Motor Arm, Left (5a. )   : No drift Motor Arm, Right (5b. ) : No drift Motor Leg, Left (6a. )  : No drift Motor Leg, Right (6b. ) : No drift Limb Ataxia (7. ): Absent Sensory (8. )  : Normal, no sensory loss Best Language (9. )  : No aphasia Dysarthria (10. ): Normal Extinction/Inattention (11.)   : No Abnormality Complete NIHSS TOTAL: 0     Neuro Assessment: (S) Exceptions to WDL (Reports that at 0700 pt had garbled speech; worsening hand tremors, and felt that his "hips were weaker than normal" bilaterally. Sxs resolved) Neuro Checks:   Initial (04/20/23 1626)  Has TPA been given? No If patient is a Neuro Trauma and patient is going to OR before floor call report to 4N Charge nurse: [phone] or [phone]   R Recommendations: See Admitting Provider Note  Report given to: [id number]

## 2023-04-21 NOTE — Progress Notes (Signed)
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 No current concerns. Reported that after episode of slurred speech on Friday, he went on to carry on with his day until wife insisted for further evaluation.  He was not aware of slurred speech. Patient denies recent infections, fevers. Received a R hip injection was about a week ago. No pain currently.   On re-evaluation, wife and daughter present. Reported concerns regarding medication management and previous episodes of subtherapeutic medicaitons. They would like vascular surgery recommendations regarding new R ICA stenosis.   Objective:  Vital signs in last 24 hours: Vitals:   04/21/23 0845 04/21/23 0900 04/21/23 1041 04/21/23 1100  BP:  (!) 167/68 (!) 166/72 (!) 192/76  Pulse:  64 65 62  Resp:  (!) 27 (!) 23 18  Temp: 97.7 F (36.5 C)  97.8 F (36.6 C) (!) 97.3 F (36.3 C)  TempSrc: Oral  Oral Oral  SpO2:  99% 92% 100%  Weight:      Height:       Supplemental O2: Room Air SpO2: 100 % Filed Weights   04/20/23 1638  Weight: 96 kg    Physical Exam:  Constitutional:well-appearing man laying in bed in NAD HENT: normocephalic atraumatic, moist mucous membranes Cardiovascular: regular rate and rhythm, systolic murmur present Pulmonary/Chest: normal work of breathing on room air, lungs clear to auscultation bilaterally. No wheezing Abdominal: normal BS, soft, non-tender, non-distended. * MSK: normal bulk and tone. * Neurological: alert, able to engage in conversation without dysarthria, appropriate thought content. Face symmetrical. 5/5 strength in bilateral upper extremities, 4/5 strength in bilateral lower extremities, symmetrical sensation  throughout Skin: warm and dry Psych: Pleasant mood and affect   No intake or output data in the 24 hours ending 04/21/23 1418 Net IO Since Admission: No IO data has been entered for this period [04/21/23 1418]  Pertinent Labs:    Latest Ref Rng & Units 04/21/2023    2:51 AM 04/20/2023    4:15 PM 04/20/2023    4:06 PM  CBC  WBC 4.0 - 10.5 K/uL 12.7   12.9   Hemoglobin 13.0 - 17.0 g/dL 84.1  32.4  40.1   Hematocrit 39.0 - 52.0 % 45.6  46.0  46.3   Platelets 150 - 400 K/uL 340   342        Latest Ref Rng & Units 04/21/2023    2:51 AM 04/20/2023    4:15 PM 04/20/2023    4:06 PM  CMP  Glucose 70 - 99 mg/dL 027  97  94   BUN 8 - 23 mg/dL 22  23  22    Creatinine 0.61 - 1.24 mg/dL 2.53  6.64  4.03   Sodium 135 - 145 mmol/L 139  139  138   Potassium 3.5 - 5.1 mmol/L 4.5  4.8  4.5   Chloride 98 - 111 mmol/L 105  106  107   CO2 22 - 32 mmol/L 26   23   Calcium 8.9 - 10.3 mg/dL 9.4   9.2   Total Protein 6.5 - 8.1 g/dL   6.9   Total Bilirubin 0.3 - 1.2 mg/dL   0.7   Alkaline Phos 38 - 126 U/L   55  AST 15 - 41 U/L   19   ALT 0 - 44 U/L   20     Imaging: ECHOCARDIOGRAM COMPLETE  Result Date: 04/21/2023    ECHOCARDIOGRAM REPORT   Patient Name:   Chase Mathis Date of Exam: 04/21/2023 Medical Rec #:  [id number]      Height:       68.0 in Accession #:    [id number]     Weight:       211.6 lb Date of Birth:  [birth year]      BSA:          2.093 m Patient Age:    89 years       BP:           199/79 mmHg Patient Gender: M              HR:           61 bpm. Exam Location:  Inpatient Procedure: 2D Echo, Color Doppler, Cardiac Doppler and Intracardiac            Opacification Agent Indications:    TIA  History:        Patient has no prior history of Echocardiogram examinations. CKD                 and TIA; Risk Factors:Dyslipidemia and Hypertension.  Sonographer:    Milbert Coulter Referring Phys: [id number] Marquita Palms VINCENT IMPRESSIONS  1. Left ventricular ejection fraction, by estimation, is  60 to 65%. The left ventricle has normal function. The left ventricle has no regional wall motion abnormalities. Left ventricular diastolic parameters are consistent with Grade I diastolic dysfunction (impaired relaxation).  2. Right ventricular systolic function is normal. The right ventricular size is normal. There is normal pulmonary artery systolic pressure. The estimated right ventricular systolic pressure is 33.7 mmHg.  3. The mitral valve is grossly normal. No evidence of mitral valve regurgitation. No evidence of mitral stenosis.  4. The aortic valve was not well visualized. Aortic valve regurgitation is mild. No aortic stenosis is present.  5. The inferior vena cava is normal in size with greater than 50% respiratory variability, suggesting right atrial pressure of 3 mmHg. Conclusion(s)/Recommendation(s): No intracardiac source of embolism detected on this transthoracic study. Consider a transesophageal echocardiogram to exclude cardiac source of embolism if clinically indicated. FINDINGS  Left Ventricle: Left ventricular ejection fraction, by estimation, is 60 to 65%. The left ventricle has normal function. The left ventricle has no regional wall motion abnormalities. Definity contrast agent was given IV to delineate the left ventricular  endocardial borders. The left ventricular internal cavity size was normal in size. There is no left ventricular hypertrophy. Left ventricular diastolic parameters are consistent with Grade I diastolic dysfunction (impaired relaxation). Right Ventricle: The right ventricular size is normal. No increase in right ventricular wall thickness. Right ventricular systolic function is normal. There is normal pulmonary artery systolic pressure. The tricuspid regurgitant velocity is 2.77 m/s, and  with an assumed right atrial pressure of 3 mmHg, the estimated right ventricular systolic pressure is 33.7 mmHg. Left Atrium: Left atrial size was normal in size. Right Atrium: Right  atrial size was normal in size. Pericardium: Trivial pericardial effusion is present. Presence of epicardial fat layer. Mitral Valve: The mitral valve is grossly normal. No evidence of mitral valve regurgitation. No evidence of mitral valve stenosis. Tricuspid Valve: The tricuspid valve is grossly normal. Tricuspid valve regurgitation is trivial. No evidence of tricuspid stenosis. Aortic Valve:  The aortic valve was not well visualized. Aortic valve regurgitation is mild. Aortic regurgitation PHT measures 453 msec. No aortic stenosis is present. Aortic valve mean gradient measures 10.0 mmHg. Aortic valve peak gradient measures 19.2  mmHg. Pulmonic Valve: The pulmonic valve was not well visualized. Pulmonic valve regurgitation is not visualized. No evidence of pulmonic stenosis. Aorta: The aortic root and ascending aorta are structurally normal, with no evidence of dilitation. Venous: The inferior vena cava is normal in size with greater than 50% respiratory variability, suggesting right atrial pressure of 3 mmHg. IAS/Shunts: The atrial septum is grossly normal.  LEFT VENTRICLE PLAX 2D LVIDd:         4.10 cm Diastology LVIDs:         2.80 cm LV e' medial:    6.31 cm/s LV PW:         1.10 cm LV E/e' medial:  9.1 LV IVS:        1.10 cm LV e' lateral:   8.16 cm/s                        LV E/e' lateral: 7.1  RIGHT VENTRICLE RV Basal diam:  3.30 cm RV Mid diam:    3.00 cm RV S prime:     9.90 cm/s TAPSE (M-mode): 1.7 cm LEFT ATRIUM             Index        RIGHT ATRIUM           Index LA diam:        2.70 cm 1.29 cm/m   RA Area:     14.90 cm LA Vol (A2C):   33.2 ml 15.86 ml/m  RA Volume:   34.30 ml  16.38 ml/m LA Vol (A4C):   34.8 ml 16.62 ml/m LA Biplane Vol: 34.3 ml 16.38 ml/m  AORTIC VALVE AV Vmax:           219.00 cm/s AV Vmean:          143.667 cm/s AV VTI:            0.473 m AV Peak Grad:      19.2 mmHg AV Mean Grad:      10.0 mmHg LVOT Vmax:         91.20 cm/s LVOT Vmean:        57.600 cm/s LVOT VTI:           0.203 m LVOT/AV VTI ratio: 0.43 AI PHT:            453 msec  AORTA Ao Root diam: 3.40 cm Ao Asc diam:  3.80 cm MITRAL VALVE               TRICUSPID VALVE MV Area (PHT): 3.60 cm    TR Peak grad:   30.7 mmHg MV Decel Time: 211 msec    TR Vmax:        277.00 cm/s MV E velocity: 57.60 cm/s MV A velocity: 94.30 cm/s  SHUNTS MV E/A ratio:  0.61        Systemic VTI: 0.20 m Lennie Odor MD Electronically signed by Lennie Odor MD Signature Date/Time: 04/21/2023/10:39:43 AM    Final    DG CHEST PORT 1 VIEW  Result Date: 04/21/2023 CLINICAL DATA:  Leukocytosis.  Slurred speech EXAM: PORTABLE CHEST 1 VIEW COMPARISON:  None Available. FINDINGS: Lordotic exam. Patient rotated rightward. Normal cardiac silhouette. No effusion, infiltrate, or pneumothorax. No acute osseous abnormality.  IMPRESSION: No acute cardiopulmonary process. Electronically Signed   By: Genevive Bi M.D.   On: 04/21/2023 09:43   CT ANGIO HEAD NECK W WO CM  Result Date: 04/21/2023 CLINICAL DATA:  Slurred speech EXAM: CT ANGIOGRAPHY HEAD AND NECK WITH AND WITHOUT CONTRAST TECHNIQUE: Multidetector CT imaging of the head and neck was performed using the standard protocol during bolus administration of intravenous contrast. Multiplanar CT image reconstructions and MIPs were obtained to evaluate the vascular anatomy. Carotid stenosis measurements (when applicable) are obtained utilizing NASCET criteria, using the distal internal carotid diameter as the denominator. RADIATION DOSE REDUCTION: This exam was performed according to the departmental dose-optimization program which includes automated exposure control, adjustment of the mA and/or kV according to patient size and/or use of iterative reconstruction technique. CONTRAST:  75mL OMNIPAQUE IOHEXOL 350 MG/ML SOLN COMPARISON:  None Available. FINDINGS: CTA NECK FINDINGS SKELETON: No acute abnormality or high grade bony spinal canal stenosis. OTHER NECK: Normal pharynx, larynx and major salivary  glands. No cervical lymphadenopathy. Unremarkable thyroid gland. UPPER CHEST: No pneumothorax or pleural effusion. No nodules or masses. AORTIC ARCH: There is calcific atherosclerosis of the aortic arch. Conventional 3 vessel aortic branching pattern. RIGHT CAROTID SYSTEM: No dissection, occlusion or aneurysm. There is mixed density, predominantly calcific atherosclerosis extending into the proximal ICA, resulting in 80% stenosis. LEFT CAROTID SYSTEM: No dissection, occlusion or aneurysm. There is calcified atherosclerosis extending into the proximal ICA, resulting in less than 50% stenosis. VERTEBRAL ARTERIES: Left dominant configuration. There is severe stenosis of the left vertebral artery origin due to calcific atherosclerosis. Both vertebral arteries are otherwise normal to the skull base. CTA HEAD FINDINGS POSTERIOR CIRCULATION: --Vertebral arteries: Mild calcific atherosclerosis of the left V4 segment without stenosis. --Inferior cerebellar arteries: Left PICA not clearly visualized. Normal right. --Basilar artery: Normal. --Superior cerebellar arteries: Normal. --Posterior cerebral arteries (PCA): Normal. ANTERIOR CIRCULATION: --Intracranial internal carotid arteries: Bilateral atherosclerotic calcification. Moderate stenosis the supraclinoid left ICA. --Anterior cerebral arteries (ACA): Normal. --Middle cerebral arteries (MCA): Normal. VENOUS SINUSES: As permitted by contrast timing, patent. ANATOMIC VARIANTS: None Review of the MIP images confirms the above findings. IMPRESSION: 1. No emergent large vessel occlusion. 2. Severe stenosis of the left vertebral artery origin due to calcific atherosclerosis. 3. Proximal right internal carotid artery stenosis of 80%. 4. Moderate stenosis of the supraclinoid left ICA due to calcific atherosclerosis. Aortic Atherosclerosis (ICD10-I70.0). Electronically Signed   By: Deatra Robinson M.D.   On: 04/21/2023 00:57   MR BRAIN WO CONTRAST  Result Date:  04/20/2023 CLINICAL DATA:  TIA, slurred speech, which has resolved EXAM: MRI HEAD WITHOUT CONTRAST TECHNIQUE: Multiplanar, multiecho pulse sequences of the brain and surrounding structures were obtained without intravenous contrast. COMPARISON:  08/12/2022 MRI head, correlation is also made with same day CT head FINDINGS: Brain: No restricted diffusion to suggest acute or subacute infarct. No acute hemorrhage, mass, mass effect, or midline shift. No hydrocephalus or extra-axial collection. Pituitary and craniocervical junction within normal limits. No hemosiderin deposition to suggest remote hemorrhage. Age related cerebral atrophy, with somewhat disproportionate atrophy in the left parietal lobe, similar to prior ex vacuo dilatation of the ventricles. Scattered T2 hyperintense signal in the periventricular white matter, likely the sequela of chronic small vessel ischemic disease. Dilated perivascular spaces in the bilateral basal ganglia. Vascular: Normal arterial flow voids. Skull and upper cervical spine: Normal marrow signal. Sinuses/Orbits: Mucosal thickening in the inferior right frontal sinus and anterior ethmoid air cells. Status post bilateral lens replacements. Other: Fluid in the  right mastoid air cells. IMPRESSION: No acute intracranial process. No evidence of acute or subacute infarct. Electronically Signed   By: Wiliam Ke M.D.   On: 04/20/2023 18:53   CT HEAD WO CONTRAST  Result Date: 04/20/2023 CLINICAL DATA:  Transient ischemic attack, slurred speech, which has resolved EXAM: CT HEAD WITHOUT CONTRAST TECHNIQUE: Contiguous axial images were obtained from the base of the skull through the vertex without intravenous contrast. RADIATION DOSE REDUCTION: This exam was performed according to the departmental dose-optimization program which includes automated exposure control, adjustment of the mA and/or kV according to patient size and/or use of iterative reconstruction technique. COMPARISON:   08/12/2022 MRI head, no prior CT head available FINDINGS: Brain: No evidence of acute infarction, hemorrhage, mass, mass effect, or midline shift. No hydrocephalus or extra-axial fluid collection. Age related cerebral volume loss, which is somewhat more advanced in the left parietal lobe. Vascular: No hyperdense vessel. Atherosclerotic calcifications in the intracranial carotid and vertebral arteries. Skull: Negative for fracture or focal lesion. Sinuses/Orbits: Mild mucosal thickening in the inferior right frontal sinus and anterior ethmoid air cells. No acute finding in the orbits. Status post bilateral lens replacements. Other: The mastoid air cells are well aerated. IMPRESSION: No acute intracranial process. Electronically Signed   By: Wiliam Ke M.D.   On: 04/20/2023 18:50    Assessment/Plan:   Principal Problem:   TIA (transient ischemic attack) Active Problems:   Hypertension   Pure hypercholesterolemia   Stage 3a chronic kidney disease (HCC)  Transient cerebral ischemia Slurred speech, resolved Severe stenosis of L vertebral artery origin Proximal R internal carotid artery stenosis 10/25 episoide of dysarthria and R sided weakness, now resolved. CT brain and brain MRI without evidence of infarct. CTA with evidence of R ICA stenosis, which does not explain presentation.  Patient with multiple centrally acting medications, see below. Evaluation for transient cerebral ischemia, or TIA, given risk factors and presentation. TTE with normal EF, no wall motion abnormalities, grade I diastolkic dysfunction, no MS or MR, and mild AR. Given that presentation is atypical, neurology team suggested symptomatic ICA stenosis, and thus recommends vascular surgery consultation for recommendations on possible surgical intervention vs medical treatment. -Appreciate neurology team recommendations -Vascular surgery consulted -DAPT for 3 months, ASA indefinitely -LDL 89; now on Crestor 20 mg daily -PT/ OT:  HH PT at this time -Still within within window for permissive hypertension; goal SBP<220, DBP <110 -Re-start lisinopril and amlodipine once out of permissive hypertension window -Cardiac monitoring  Depression Parkinsonism Medication management Lithium level 0.55; not currently at therapeutic range. Per patient, dosage was recently adjusted from 4 tablets to 3 tablets. Family questions whether patient counting/taking medications appropriately. Patient reports he had been at a therapeutic range at Dr. Alwyn Ren office. If this is the case, lithium withdrawal could also be contributing to his owrsening hand tremors and imbalance concerns. He currently manages all his medications at home. Of note, patient reports that he has presented with similar episodes (tremor, imbalance, though not slurred speech) with subtherapeutic TCA and lithium levels. Nortriptyline level not obtained during this hospitalization. -Outpatient follow up with Dr. Jennelle Human, his long term psychiatrist for nortriptyline and and lithium management -Suggested medication management with blister packs/pill boxes; family to trying this approach outpatient -Continue on: lithium 300 mg nightly, Ativan 0.5 mg nightly PRN, Nortriptyline 100 mg nightly   Diet: Heart Healthy VTE: Enoxaparin Code: DNR/DNI PT/OT recs: Home Health, none. Family Update: Wife and daughter at bedside   Dispo: Anticipated discharge to  Assisted living facility  in 1-2 days pending vascular surgery consultation  Morene Crocker, MD Internal Medicine Resident PGY-2 Please contact the on call pager after 5 pm and on weekends at [phone].

## 2023-04-21 NOTE — Progress Notes (Addendum)
STROKE PROGRESS NOTE    HISTORY OF PRESENT ILLNESS    History of Present Illness:  Chase Mathis is an 89 y.o. Caucasian male with PMH of parkinsonism (diagnosed, no response to empiric trial of sinemet and this was discontinued, per family);  HTN, MDD, tremor, peripheral neuropathy, and lumbar radiculopathy who presented last evening with slurred speech and gait abnormality.   Wife, at bedside, provides more history. Patient was standing at sink, holding on to it when she noted she could not understand him due to slurring of his speech. They called an Benedetto Goad, which took them  to an Urgent Care, where they were then sent to the ED. Wife noted patient to be walking with cane in his left hand with no issue. He was veering to the right when ambulating into the [place] and had trouble getting into the car.   Symptoms lasted a total of 4 hours before spontaneous resolution. Daughter also joins [place] at bedside and patient's son via telephone. Son states the patient had 2 prior episodes of this in the past, once when lithium level found to be low and another when nortriptyline level found to be low, reportedly.   OOW TNK. Patient and family agree that patient now at baseline. He offers no complaints.   -BP elevated as high as 199/79. Patient prescribed amlodipine and lisinopril and states is compliant with each.  -has chronic leukocytosis which is stable comparatively  -mild AKI -also has L5 radiculopathy fr which had recent EMG/NCS in August, of note  -LDL 84, A1c 5.9 -CTA head and neck revealed 80% stenosis of left ICA, which is a new finding for patient. Vascular surgery consulted for input of likely asymptomatic lesion -Brain MRI unremarkable   The patient denies headache, dizziness, visual changes, problems with swallowing or speech, focal muscle weakness, numbness or tingling of extremities, new or change in abnormal movements, or other focal neurological deficits.   Hospital day # 0 24 HOUR EVENTS    -no return of symptoms  -pending therapy evals, and remainder of toxic-metabolic workup, as below  -TTE: EF 60-65%, no wall motion abnormalities, grade I diastolic dysfunction  PAST MEDICAL HISTORY    Past Medical History:  Past Medical History:  Diagnosis Date   Benign non-nodular prostatic hyperplasia with lower urinary tract symptoms 12/21/2014   Chronic sinusitis 08/23/2017   Combined forms of age-related cataract of both eyes 06/15/2016   Elevated prostate specific antigen (PSA) 07/03/2011   Epiretinal membrane, bilateral 06/03/2015   Epistaxis 08/16/2017   History of electroconvulsive therapy    Hypertension    Left ureteral stone 01/28/2015   Lithium-induced tremor    Major depressive disorder in remission    MCI (mild cognitive impairment) with memory loss 01/05/2021   Peripheral neuropathy    Recurrent nephrolithiasis 09/02/2017   Vitreous syneresis of both eyes 06/03/2015    No family history on file. Family History  Problem Relation Age of Onset   Lung cancer Mother    Heart disease Father    Pancreatic cancer Sister    Healthy Son    Healthy Daughter     Allergies:  No Known Allergies  Social History:   reports that he has never smoked. He has never used smokeless tobacco. He reports current alcohol use. He reports that he does not use drugs.    Medications Medications Prior to Admission  Medication Sig Dispense Refill   amLODipine (NORVASC) 5 MG tablet Take 5 mg by mouth daily.  chlorhexidine (PERIDEX) 0.12 % solution Use as directed 15 mLs in the mouth or throat 2 (two) times daily.     finasteride (PROSCAR) 5 MG tablet Take 1 tablet (5 mg total) by mouth daily. 90 tablet 3   lisinopril (PRINIVIL,ZESTRIL) 10 MG tablet Take 10 mg by mouth daily.     lithium carbonate 150 MG capsule Take 3 capsules (450 mg) by mouth at bedtime. 270 capsule 0   LORazepam (ATIVAN) 0.5 MG tablet Take 1 tablet (0.5 mg total) by mouth at bedtime as needed for anxiety. 30  tablet 2   nortriptyline (PAMELOR) 50 MG capsule Take 2 capsules (100 mg total) by mouth at bedtime. 180 capsule 0   pyridoxine (B-6) 250 MG tablet Take 250 mg by mouth 2 (two) times daily.     carbidopa-levodopa (SINEMET IR) 25-100 MG tablet TAKE 1 TABLET BY MOUTH 3 TIMES A DAY (6AM,11AM AND 4PM) (Patient not taking: Reported on 04/20/2023) 270 tablet 0   nortriptyline (PAMELOR) 50 MG capsule Take 2 capsules (100 mg total) by mouth at bedtime. 180 capsule 0    EXAMINATION    Current vital signs:    04/21/2023   11:00 AM 04/21/2023   10:41 AM 04/21/2023    9:00 AM  Vitals with BMI  Systolic 192 166 536  Diastolic 76 72 68  Pulse 62 65 64    Examination:  GENERAL: Awake, alert in NAD HEENT: - Normocephalic and atraumatic, dry mm, no lymphadenopathy, no Thyromegally LUNGS - Clear to auscultation bilaterally CV - S1S2 RRR, equal pulses bilaterally. ABDOMEN - Soft, nontender, nondistended with normoactive BS Ext: warm, well perfused, intact peripheral pulses, no pedal edema  NEURO:  Mental Status: AA&Ox3 Language: speech is slightly dysphonic (family states at baseline).  In tact naming, repetition, fluency, and comprehension. Cranial Nerves:  II: PERRL. Visual fields full to confrontation.  III, IV, VI: EOM in tact. Eyelids elevate symmetrically. Blinks to threat.  V: Sensation intact V1-3 symmetrically  VII: no facial asymmetry   VIII: hearing intact to voice IX, X: Palate elevates symmetrically. Phonation is normal.  UY:[id number] shrug 5/5 and symmetrical  XII: tongue is midline without fasciculations. Motor:  5/5 throughout   Tone: is normal and bulk is normal DTRs: 2+ and symmetrical upper extremities, 1+ and equal lowers    Sensation- Intact to light touch, pin prick Coordination: FTN intact bilaterally, no ataxia in BLE., subtle right >left UE kinetic > wing beating > intention tremor with perhaps subtle resting component on right  Gait- deferred  NIHSS: 0 LABS    I have reviewed labs in epic and the results pertinent to this consultation are:  Lab Results  Component Value Date   LDLCALC 84 04/21/2023   Lab Results  Component Value Date   ALT 20 04/20/2023   AST 19 04/20/2023   ALKPHOS 55 04/20/2023   BILITOT 0.7 04/20/2023   Lab Results  Component Value Date   HGBA1C 5.9 (H) 04/21/2023   Lab Results  Component Value Date   WBC 12.7 (H) 04/21/2023   HGB 14.5 04/21/2023   HCT 45.6 04/21/2023   MCV 94.4 04/21/2023   PLT 340 04/21/2023   Lab Results  Component Value Date   VITAMINB12 >2000 (H) 09/16/2021   No results found for: "FOLATE" Lab Results  Component Value Date   NA 139 04/21/2023   K 4.5 04/21/2023   CL 105 04/21/2023   CO2 26 04/21/2023    DIAGNOSTIC IMAGING/PROCEDURES   I have reviewed the  images obtained:, as below    CT-head unremarkable   CTA head and neck   RIGHT CAROTID SYSTEM: No dissection, occlusion or aneurysm. There is mixed density, predominantly calcific atherosclerosis extending into the proximal ICA, resulting in 80% stenosis.   LEFT CAROTID SYSTEM: No dissection, occlusion or aneurysm. There is calcified atherosclerosis extending into the proximal ICA, resulting in less than 50% stenosis.   VERTEBRAL ARTERIES: Left dominant configuration. There is severe stenosis of the left vertebral artery origin due to calcific atherosclerosis. Both vertebral arteries are otherwise normal to the skull base.  MRI brain without acute ischemia nor chronic   TTE: EF 60-65%, no wall motion abnormalities, grade I diastolic dysfunction  Lab Results  Component Value Date   LDLCALC 84 04/21/2023   Lab Results  Component Value Date   HGBA1C 5.9 (H) 04/21/2023   ASSESSMENT/PLAN    Assessment:  Chase Mathis is a 89 y.o. male with history of parkinsonism (diagnosed, no response to empiric trial of sinemet and this was discontinued, per family);  HTN, MDD, tremor, peripheral neuropathy, and lumbar  radiculopathy  presenting with transient episode of right sided weakness and dysarthria x 4 hours.  He has returned to his baseline. Found to have mild AKI, uncontrolled BP readings, and critical right ICA stenosis that would not explain symptomatology. He has reportedly had similar episodes with low Lithium and nortriptyline levels. Entertained focal seizures based on stereotypy of events, but less likely than vascular event based on duration. Cannot rule out superimposed toxic-metabolic encephalopathy, but thus far does not seem likely based on resulted labs. Lithium toxicity not suspected, level low at 0.55. UA negative , as is CXR.   Considering focality, would be prudent to treat as TIA to mitigate future events.   Impression: Left hemispheric TIA  Etiology cryptogenic   Plan: TIA -add ASA lifelong -add plavix x 21 days  -add rosuvastatin 20mg  , LDL 84 -ST/PT/SLP evals -Vte ppx: enox  -Vascular surgery consult for asymptomatic RICA critical stenosis, med management versus surgical intervention  Hypertension Home meds:  lisinopril 10 mg, amlodipine 5 mg: rec increasing amlo to 10mg  at least long term    Pre-Diabetes  HgbA1c 5.9, diet and exercises counseling   Other Stroke Risk Factors Advanced Age >/= 30  Obesity, Body mass index is 32.18 kg/m., BMI >/= 30 associated with increased stroke risk, recommend weight loss, diet and exercise as appropriate   Mood disorder: -discuss increasing lithium dose with psychiatrist, level subtherapeutic  -- Sanjuana Letters, PA-C Neurology Department   **This documentation was dictated using Dragon Medical Software and may contain inadvertent errors **  ATTENDING NOTE: I reviewed above note and agree with the assessment and plan. Pt was seen and examined.   ASSESSMENT/PLAN Chase Mathis is a 89 y.o. male with history of hypertension, tremor, MDD on DCM admitted for an episode of slurred speech, imbalance,?  Left-sided weakness. No tPA  given due to symptom resolved.    Likely TIA:  right brain TIA possible secondary to right ICA high-grade stenosis CT no acute finding CTA head and neck right ICA 80% stenosis, R V1 atherosclerosis, left VA origin severe stenosis MRI no acute infarct 2D Echo EF 60 to 65% LDL 84 HgbA1c 5.9 Lovenox for VTE prophylaxis No antithrombotic prior to admission, now on aspirin 81 mg daily and clopidogrel 75 mg daily DAPT.  Further antiplatelet regimen per [facility]. Patient counseled to be compliant with his antithrombotic medications Ongoing aggressive stroke risk factor management  Therapy recommendations: Home health PT Disposition: Home  Right ICA stenosis Symptoms not quite typical Lithium level, UA, CXR and ammonia level not into explain patient's symptoms ?  Symptomatic right ICA stenosis VVS Dr. Sherral Hammers on board Plan for outpatient follow-up next week to discuss options of right ICA intervention  Hypertension Stable Long term BP goal normotensive  Hyperlipidemia Home meds: None LDL 84, goal < 70 Now on Crestor 20 Continue statin at discharge  Other Stroke Risk Factors Advanced age Obesity, Body mass index is 32.18 kg/m.   Other Active Problems MDD on lithium and nortriptyline, lithium level 0.55 Tremor, mild   Hospital day # 0  Neurology will sign off. Please call with questions. Pt will follow up with Dr.Tat at [facility] on 07/06/2023.  Thanks for the consult.   Marvel Plan, MD PhD Stroke Neurology 04/21/2023 9:57 PM    To contact Stroke Continuity provider, please refer to [email or web address]. After hours, contact General Neurology

## 2023-04-21 NOTE — ED Notes (Signed)
Pt transported to ECHO. 

## 2023-04-21 NOTE — ED Notes (Signed)
X-ray at bedside

## 2023-04-21 NOTE — Progress Notes (Signed)
OT Cancellation Note  Patient Details Name: Chase Mathis MRN: [id number] DOB: 05/12/1934   Cancelled Treatment:    Reason Eval/Treat Not Completed: OT screened, no needs identified, will sign off Per PT, no new concerns with ADLs/mobility. No formal OT eval needed at this time.  Lorre Munroe 04/21/2023, 8:14 AM

## 2023-04-21 NOTE — TOC Transition Note (Signed)
Transition of Care [facility]) - CM/SW Discharge Note   Patient Details  Name: Chase Mathis MRN: [id number] Date of Birth: 1933/10/13  Transition of Care [facility]) CM/SW Contact:  Lawerance Sabal, RN Phone Number: 04/21/2023, 2:08 PM   Clinical Narrative:     Sherron Monday w wife and family at bedside. Discussed needs for DC, benefits for having home health nurse to assist with medication management. Agreeable to [facility] services with [facility], then trasitioning to in house services at [facility].  Referral placed to [facility]. No other TOC needs identified for DC.   Final next level of care: Home w Home Health Services Barriers to Discharge: No Barriers Identified   Patient Goals and CMS Choice CMS Medicare.gov Compare Post Acute Care list provided to:: Patient Choice offered to / list presented to : Patient  Discharge Placement                         Discharge Plan and Services Additional resources added to the After Visit Summary for                  DME Arranged: N/A         HH Arranged: RN, PT [facility] Agency: [facility] Date [facility] Agency Contacted: 04/21/23 Time HH Agency Contacted: [phone] Representative spoke with at [facility] Agency: Kandee Keen  Social Determinants of Health (SDOH) Interventions SDOH Screenings   Food Insecurity: No Food Insecurity (04/21/2023)  Housing: Low Risk  (04/21/2023)  Transportation Needs: No Transportation Needs (04/21/2023)  Utilities: Not At Risk (04/21/2023)  Social Connections: Unknown (11/07/2021)   Received from [facility]  Tobacco Use: Low Risk  (04/20/2023)     Readmission Risk Interventions     No data to display

## 2023-04-21 NOTE — Consult Note (Signed)
NEUROLOGY CONSULT NOTE   Date of service: April 21, 2023 Patient Name: Chase Mathis MRN:  [id number] DOB:  1934-04-15 Chief Complaint: "slurred speech, off balance" Requesting Provider: Tyson Alias, *  History of Present Illness  Chase Mathis is a 89 y.o. male with hx of parkinsonism, HTN, MDD, tremor, peripheral neuropathy who presents with slurred speech and gait abnormality. Went to bed last night and was at his baseline. Woke up an hour before his wife did. When wife woke up, noted speech was significantly slurred. This went on for over an hour. No prior similar symptoms. This was an abrupt change. Went to bed fine last night. Wofe noted that he had to hold on to the wall with 1 hand when he was trying to wash his hands.  No hx of stroke, no family hx of stroke. Does not smoke.  Symptoms had resolved but came to the ED to get checked out.  No falls over the last couple months per family and patient. Does have a small scrape over his left patella.  LKW: 04/20/23 in the evening. Modified rankin score: 1-No significant post stroke disability and can perform usual duties with stroke symptoms IV Thrombolysis: not offered 2/2 resolution of symptoms. EVT: not offered due to resolution of symptoms.   ROS  Comprehensive ROS performed and pertinent positives documented in HPI  Past History   Past Medical History:  Diagnosis Date   Benign non-nodular prostatic hyperplasia with lower urinary tract symptoms 12/21/2014   Chronic sinusitis 08/23/2017   Combined forms of age-related cataract of both eyes 06/15/2016   Elevated prostate specific antigen (PSA) 07/03/2011   Epiretinal membrane, bilateral 06/03/2015   Epistaxis 08/16/2017   History of electroconvulsive therapy    Hypertension    Left ureteral stone 01/28/2015   Lithium-induced tremor    Major depressive disorder in remission    MCI (mild cognitive impairment) with memory loss 01/05/2021   Peripheral neuropathy     Recurrent nephrolithiasis 09/02/2017   Vitreous syneresis of both eyes 06/03/2015    Past Surgical History:  Procedure Laterality Date   CYSTOSCOPY WITH RETROGRADE PYELOGRAM, URETEROSCOPY AND STENT PLACEMENT Left 01/28/2015   Procedure: CYSTOSCOPY WITH LEFT RETROGRADE PYELOGRAM, LEFT URETEROSCOPY/STONE BASKETING  AND LEFT URETERAL  STENT PLACEMENT;  Surgeon: Jethro Bolus, MD;  Location: [facility];  Service: Urology;  Laterality: Left;   SINUS IRRIGATION      Family History: Family History  Problem Relation Age of Onset   Lung cancer Mother    Heart disease Father    Pancreatic cancer Sister    Healthy Son    Healthy Daughter     Social History  reports that he has never smoked. He has never used smokeless tobacco. He reports current alcohol use. He reports that he does not use drugs.  No Known Allergies  Medications   Current Facility-Administered Medications:    acetaminophen (TYLENOL) tablet 650 mg, 650 mg, Oral, Q6H PRN **OR** acetaminophen (TYLENOL) suppository 650 mg, 650 mg, Rectal, Q6H PRN, Atway, Rayann N, DO   aspirin chewable tablet 81 mg, 81 mg, Oral, Daily, Atway, Rayann N, DO   clopidogrel (PLAVIX) tablet 75 mg, 75 mg, Oral, Daily, Atway, Rayann N, DO   enoxaparin (LOVENOX) injection 40 mg, 40 mg, Subcutaneous, Q24H, Atway, Rayann N, DO   finasteride (PROSCAR) tablet 5 mg, 5 mg, Oral, Daily, Atway, Rayann N, DO   lithium carbonate capsule 450 mg, 450 mg, Oral, QHS, Atway, Rayann N, DO   LORazepam (  ATIVAN) tablet 0.5 mg, 0.5 mg, Oral, QHS PRN, Atway, Rayann N, DO   nortriptyline (PAMELOR) capsule 100 mg, 100 mg, Oral, QHS, Atway, Rayann N, DO   pyridOXINE (VITAMIN B6) tablet 250 mg, 250 mg, Oral, BID, Atway, Rayann N, DO   senna-docusate (Senokot-S) tablet 1 tablet, 1 tablet, Oral, QHS PRN, Atway, Rayann N, DO  Current Outpatient Medications:    amLODipine (NORVASC) 5 MG tablet, Take 5 mg by mouth daily., Disp: , Rfl:    chlorhexidine (PERIDEX) 0.12 %  solution, Use as directed 15 mLs in the mouth or throat 2 (two) times daily., Disp: , Rfl:    finasteride (PROSCAR) 5 MG tablet, Take 1 tablet (5 mg total) by mouth daily., Disp: 90 tablet, Rfl: 3   lisinopril (PRINIVIL,ZESTRIL) 10 MG tablet, Take 10 mg by mouth daily., Disp: , Rfl:    lithium carbonate 150 MG capsule, Take 3 capsules (450 mg) by mouth at bedtime., Disp: 270 capsule, Rfl: 0   LORazepam (ATIVAN) 0.5 MG tablet, Take 1 tablet (0.5 mg total) by mouth at bedtime as needed for anxiety., Disp: 30 tablet, Rfl: 2   nortriptyline (PAMELOR) 50 MG capsule, Take 2 capsules (100 mg total) by mouth at bedtime., Disp: 180 capsule, Rfl: 0   pyridoxine (B-6) 250 MG tablet, Take 250 mg by mouth 2 (two) times daily., Disp: , Rfl:    carbidopa-levodopa (SINEMET IR) 25-100 MG tablet, TAKE 1 TABLET BY MOUTH 3 TIMES A DAY (6AM,11AM AND 4PM) (Patient not taking: Reported on 04/20/2023), Disp: 270 tablet, Rfl: 0   nortriptyline (PAMELOR) 50 MG capsule, Take 2 capsules (100 mg total) by mouth at bedtime., Disp: 180 capsule, Rfl: 0  Vitals   Vitals:   04/20/23 2004 04/20/23 2135 04/20/23 2200 04/20/23 2345  BP:  (!) 159/66 (!) 167/70   Pulse:  67 67   Resp:      Temp: 97.9 F (36.6 C)   98 F (36.7 C)  SpO2:  97% 100%   Weight:      Height:        Body mass index is 32.18 kg/m.  Physical Exam   Constitutional: Appears well-developed and well-nourished.  Psych: Affect appropriate to situation.  Eyes: No scleral injection.  HENT: No OP obstruction.  Head: Normocephalic.  Cardiovascular: Normal rate and regular rhythm.  Respiratory: Effort normal, non-labored breathing.  GI: Soft.  No distension. There is no tenderness.  Skin: WDI. Small scrape over his L patella but denies any falls.  Neurologic Examination  Mental status/Cognition: Alert, oriented to self, place, month and year, good attention.  Speech/language: Fluent, comprehension intact, object naming intact, repetition intact.   Cranial nerves:   CN II Pupils equal and reactive to light, no VF deficits    CN III,IV,VI EOM intact, no gaze preference or deviation, no nystagmus    CN V normal sensation in V1, V2, and V3 segments bilaterally    CN VII no asymmetry, no nasolabial fold flattening    CN VIII normal hearing to speech    CN IX & X normal palatal elevation, no uvular deviation    CN XI 5/5 head turn and 5/5 shoulder shrug bilaterally    CN XII midline tongue protrusion    Motor:  Muscle bulk: normal, tone normal, pronator drift none tremor none Mvmt Root Nerve  Muscle Right Left Comments  SA C5/6 Ax Deltoid 5 5   EF C5/6 Mc Biceps 5 5   EE C6/7/8 Rad Triceps 5 5  WF C6/7 Med FCR     WE C7/8 PIN ECU     F Ab C8/T1 U ADM/FDI 5 5   HF L1/2/3 Fem Illopsoas 5 5   KE L2/3/4 Fem Quad 5 5   DF L4/5 D Peron Tib Ant 5 5   PF S1/2 Tibial Grc/Sol 5 5    Sensation:  Light touch Intact throughout   Pin prick    Temperature    Vibration   Proprioception    Coordination/Complex Motor:  - Finger to Nose intact BL - Heel to shin intact BL - Rapid alternating movement are slowed. - Gait: deferred.   Labs   CBC:  Recent Labs  Lab 04/20/23 1606 04/20/23 1615  WBC 12.9*  --   NEUTROABS 9.8*  --   HGB 14.7 15.6  HCT 46.3 46.0  MCV 94.7  --   PLT 342  --     Basic Metabolic Panel:  Lab Results  Component Value Date   NA 139 04/20/2023   K 4.8 04/20/2023   CO2 23 04/20/2023   GLUCOSE 97 04/20/2023   BUN 23 04/20/2023   CREATININE 1.30 (H) 04/20/2023   CALCIUM 9.2 04/20/2023   GFRNONAA 52 (L) 04/20/2023   GFRAA 68 03/25/2019   Lipid Panel: No results found for: "LDLCALC" HgbA1c: No results found for: "HGBA1C" Urine Drug Screen: No results found for: "LABOPIA", "COCAINSCRNUR", "LABBENZ", "AMPHETMU", "THCU", "LABBARB"  Alcohol Level     Component Value Date/Time   ETH <10 04/20/2023 1606   INR  Lab Results  Component Value Date   INR 1.1 04/20/2023   APTT  Lab Results  Component  Value Date   APTT 33 04/20/2023   AED levels: No results found for: "PHENYTOIN", "ZONISAMIDE", "LAMOTRIGINE", "LEVETIRACETA"   CT Head without contrast(Personally reviewed): CTH was negative for a large hypodensity concerning for a large territory infarct or hyperdensity concerning for an ICH  CT angio Head and Neck with contrast: Pending  MRI Brain(Personally reviewed): No acute stroke  Impression   Chase Mathis is a 89 y.o. male presenting with sudden onset of 1+hour episode of slurred speech and gait abnormality. Episode concerning for TIA.  Recommendations  Plan:  Recommend that primary team order following: - Frequent Neuro checks per stroke unit protocol - Recommend brain imaging with MRI Brain without contrast - Recommend Vascular imaging with MRA Angio Head without contrast and US Carotid doppler - Recommend obtaining TTE - Recommend obtaining Lipid panel with LDL - Please start statin if LDL > 70 - Recommend HbA1c to evaluate for diabetes and how well it is controlled. - Antithrombotic - Aspirin 81mg  daily along with plavix 75mg  daily x 21 days, followed by Aspirin 81mg  daily alone. - Recommend DVT ppx - SBP goal - permissive hypertension first 24 h < 220/110. Held home meds.  - Recommend Telemetry monitoring for arrythmia - Recommend bedside swallow screen prior to PO intake. - Stroke education booklet - Recommend PT/OT/SLP consult  ______________________________________________________________________    Welton Flakes

## 2023-04-22 NOTE — Discharge Summary (Cosign Needed Addendum)
Total Bilirubin 0.3 - 1.2 mg/dL   0.7   Alkaline Phos 38 - 126 U/L   55   AST 15 - 41 U/L   19   ALT 0 - 44 U/L   20     ECHOCARDIOGRAM COMPLETE  Result Date: 04/21/2023    ECHOCARDIOGRAM REPORT   Patient Name:   Chase Mathis Date of Exam: 04/21/2023 Medical Rec #:  [id number]      Height:       68.0 in Accession #:    [id number]     Weight:       211.6 lb Date of Birth:  1933/10/11      BSA:          2.093 m Patient Age:    89 years       BP:           199/79 mmHg Patient Gender: M              HR:           61 bpm. Exam Location:  Inpatient Procedure: 2D Echo, Color Doppler, Cardiac Doppler and Intracardiac            Opacification Agent Indications:    TIA  History:        Patient has no prior history of Echocardiogram examinations. CKD                 and TIA; Risk Factors:Dyslipidemia and Hypertension.  Sonographer:    Milbert Coulter Referring Phys: [id number] Marquita Palms VINCENT IMPRESSIONS  1. Left ventricular ejection fraction, by estimation, is  60 to 65%. The left ventricle has normal function. The left ventricle has no regional wall motion abnormalities. Left ventricular diastolic parameters are consistent with Grade I diastolic dysfunction (impaired relaxation).  2. Right ventricular systolic function is normal. The right ventricular size is normal. There is normal pulmonary artery systolic pressure. The estimated right ventricular systolic pressure is 33.7 mmHg.  3. The mitral valve is grossly normal. No evidence of mitral valve regurgitation. No evidence of mitral stenosis.  4. The aortic valve was not well visualized. Aortic valve regurgitation is mild. No aortic stenosis is present.  5. The inferior vena cava is normal in size with greater than 50% respiratory variability, suggesting right atrial pressure of 3 mmHg. Conclusion(s)/Recommendation(s): No intracardiac source of embolism detected on this transthoracic study. Consider a transesophageal echocardiogram to exclude cardiac source of embolism if clinically indicated. FINDINGS  Left Ventricle: Left ventricular ejection fraction, by estimation, is 60 to 65%. The left ventricle has normal function. The left ventricle has no regional wall motion abnormalities. Definity contrast agent was given IV to delineate the left ventricular  endocardial borders. The left ventricular internal cavity size was normal in size. There is no left ventricular hypertrophy. Left ventricular diastolic parameters are consistent with Grade I diastolic dysfunction (impaired relaxation). Right Ventricle: The right ventricular size is normal. No increase in right ventricular wall thickness. Right ventricular systolic function is normal. There is normal pulmonary artery systolic pressure. The tricuspid regurgitant velocity is 2.77 m/s, and  with an assumed right atrial pressure of 3 mmHg, the estimated right ventricular systolic pressure is 33.7 mmHg. Left Atrium: Left atrial size was normal in size. Right Atrium: Right  atrial size was normal in size. Pericardium: Trivial pericardial effusion is present. Presence of epicardial fat layer. Mitral Valve: The mitral valve is grossly normal. No evidence of mitral valve regurgitation. No evidence of mitral valve  Total Bilirubin 0.3 - 1.2 mg/dL   0.7   Alkaline Phos 38 - 126 U/L   55   AST 15 - 41 U/L   19   ALT 0 - 44 U/L   20     ECHOCARDIOGRAM COMPLETE  Result Date: 04/21/2023    ECHOCARDIOGRAM REPORT   Patient Name:   Chase Mathis Date of Exam: 04/21/2023 Medical Rec #:  [id number]      Height:       68.0 in Accession #:    [id number]     Weight:       211.6 lb Date of Birth:  1933/10/11      BSA:          2.093 m Patient Age:    89 years       BP:           199/79 mmHg Patient Gender: M              HR:           61 bpm. Exam Location:  Inpatient Procedure: 2D Echo, Color Doppler, Cardiac Doppler and Intracardiac            Opacification Agent Indications:    TIA  History:        Patient has no prior history of Echocardiogram examinations. CKD                 and TIA; Risk Factors:Dyslipidemia and Hypertension.  Sonographer:    Milbert Coulter Referring Phys: [id number] Marquita Palms VINCENT IMPRESSIONS  1. Left ventricular ejection fraction, by estimation, is  60 to 65%. The left ventricle has normal function. The left ventricle has no regional wall motion abnormalities. Left ventricular diastolic parameters are consistent with Grade I diastolic dysfunction (impaired relaxation).  2. Right ventricular systolic function is normal. The right ventricular size is normal. There is normal pulmonary artery systolic pressure. The estimated right ventricular systolic pressure is 33.7 mmHg.  3. The mitral valve is grossly normal. No evidence of mitral valve regurgitation. No evidence of mitral stenosis.  4. The aortic valve was not well visualized. Aortic valve regurgitation is mild. No aortic stenosis is present.  5. The inferior vena cava is normal in size with greater than 50% respiratory variability, suggesting right atrial pressure of 3 mmHg. Conclusion(s)/Recommendation(s): No intracardiac source of embolism detected on this transthoracic study. Consider a transesophageal echocardiogram to exclude cardiac source of embolism if clinically indicated. FINDINGS  Left Ventricle: Left ventricular ejection fraction, by estimation, is 60 to 65%. The left ventricle has normal function. The left ventricle has no regional wall motion abnormalities. Definity contrast agent was given IV to delineate the left ventricular  endocardial borders. The left ventricular internal cavity size was normal in size. There is no left ventricular hypertrophy. Left ventricular diastolic parameters are consistent with Grade I diastolic dysfunction (impaired relaxation). Right Ventricle: The right ventricular size is normal. No increase in right ventricular wall thickness. Right ventricular systolic function is normal. There is normal pulmonary artery systolic pressure. The tricuspid regurgitant velocity is 2.77 m/s, and  with an assumed right atrial pressure of 3 mmHg, the estimated right ventricular systolic pressure is 33.7 mmHg. Left Atrium: Left atrial size was normal in size. Right Atrium: Right  atrial size was normal in size. Pericardium: Trivial pericardial effusion is present. Presence of epicardial fat layer. Mitral Valve: The mitral valve is grossly normal. No evidence of mitral valve regurgitation. No evidence of mitral valve  Total Bilirubin 0.3 - 1.2 mg/dL   0.7   Alkaline Phos 38 - 126 U/L   55   AST 15 - 41 U/L   19   ALT 0 - 44 U/L   20     ECHOCARDIOGRAM COMPLETE  Result Date: 04/21/2023    ECHOCARDIOGRAM REPORT   Patient Name:   Chase Mathis Date of Exam: 04/21/2023 Medical Rec #:  [id number]      Height:       68.0 in Accession #:    [id number]     Weight:       211.6 lb Date of Birth:  1933/10/11      BSA:          2.093 m Patient Age:    89 years       BP:           199/79 mmHg Patient Gender: M              HR:           61 bpm. Exam Location:  Inpatient Procedure: 2D Echo, Color Doppler, Cardiac Doppler and Intracardiac            Opacification Agent Indications:    TIA  History:        Patient has no prior history of Echocardiogram examinations. CKD                 and TIA; Risk Factors:Dyslipidemia and Hypertension.  Sonographer:    Milbert Coulter Referring Phys: [id number] Marquita Palms VINCENT IMPRESSIONS  1. Left ventricular ejection fraction, by estimation, is  60 to 65%. The left ventricle has normal function. The left ventricle has no regional wall motion abnormalities. Left ventricular diastolic parameters are consistent with Grade I diastolic dysfunction (impaired relaxation).  2. Right ventricular systolic function is normal. The right ventricular size is normal. There is normal pulmonary artery systolic pressure. The estimated right ventricular systolic pressure is 33.7 mmHg.  3. The mitral valve is grossly normal. No evidence of mitral valve regurgitation. No evidence of mitral stenosis.  4. The aortic valve was not well visualized. Aortic valve regurgitation is mild. No aortic stenosis is present.  5. The inferior vena cava is normal in size with greater than 50% respiratory variability, suggesting right atrial pressure of 3 mmHg. Conclusion(s)/Recommendation(s): No intracardiac source of embolism detected on this transthoracic study. Consider a transesophageal echocardiogram to exclude cardiac source of embolism if clinically indicated. FINDINGS  Left Ventricle: Left ventricular ejection fraction, by estimation, is 60 to 65%. The left ventricle has normal function. The left ventricle has no regional wall motion abnormalities. Definity contrast agent was given IV to delineate the left ventricular  endocardial borders. The left ventricular internal cavity size was normal in size. There is no left ventricular hypertrophy. Left ventricular diastolic parameters are consistent with Grade I diastolic dysfunction (impaired relaxation). Right Ventricle: The right ventricular size is normal. No increase in right ventricular wall thickness. Right ventricular systolic function is normal. There is normal pulmonary artery systolic pressure. The tricuspid regurgitant velocity is 2.77 m/s, and  with an assumed right atrial pressure of 3 mmHg, the estimated right ventricular systolic pressure is 33.7 mmHg. Left Atrium: Left atrial size was normal in size. Right Atrium: Right  atrial size was normal in size. Pericardium: Trivial pericardial effusion is present. Presence of epicardial fat layer. Mitral Valve: The mitral valve is grossly normal. No evidence of mitral valve regurgitation. No evidence of mitral valve  Total Bilirubin 0.3 - 1.2 mg/dL   0.7   Alkaline Phos 38 - 126 U/L   55   AST 15 - 41 U/L   19   ALT 0 - 44 U/L   20     ECHOCARDIOGRAM COMPLETE  Result Date: 04/21/2023    ECHOCARDIOGRAM REPORT   Patient Name:   Chase Mathis Date of Exam: 04/21/2023 Medical Rec #:  [id number]      Height:       68.0 in Accession #:    [id number]     Weight:       211.6 lb Date of Birth:  1933/10/11      BSA:          2.093 m Patient Age:    89 years       BP:           199/79 mmHg Patient Gender: M              HR:           61 bpm. Exam Location:  Inpatient Procedure: 2D Echo, Color Doppler, Cardiac Doppler and Intracardiac            Opacification Agent Indications:    TIA  History:        Patient has no prior history of Echocardiogram examinations. CKD                 and TIA; Risk Factors:Dyslipidemia and Hypertension.  Sonographer:    Milbert Coulter Referring Phys: [id number] Marquita Palms VINCENT IMPRESSIONS  1. Left ventricular ejection fraction, by estimation, is  60 to 65%. The left ventricle has normal function. The left ventricle has no regional wall motion abnormalities. Left ventricular diastolic parameters are consistent with Grade I diastolic dysfunction (impaired relaxation).  2. Right ventricular systolic function is normal. The right ventricular size is normal. There is normal pulmonary artery systolic pressure. The estimated right ventricular systolic pressure is 33.7 mmHg.  3. The mitral valve is grossly normal. No evidence of mitral valve regurgitation. No evidence of mitral stenosis.  4. The aortic valve was not well visualized. Aortic valve regurgitation is mild. No aortic stenosis is present.  5. The inferior vena cava is normal in size with greater than 50% respiratory variability, suggesting right atrial pressure of 3 mmHg. Conclusion(s)/Recommendation(s): No intracardiac source of embolism detected on this transthoracic study. Consider a transesophageal echocardiogram to exclude cardiac source of embolism if clinically indicated. FINDINGS  Left Ventricle: Left ventricular ejection fraction, by estimation, is 60 to 65%. The left ventricle has normal function. The left ventricle has no regional wall motion abnormalities. Definity contrast agent was given IV to delineate the left ventricular  endocardial borders. The left ventricular internal cavity size was normal in size. There is no left ventricular hypertrophy. Left ventricular diastolic parameters are consistent with Grade I diastolic dysfunction (impaired relaxation). Right Ventricle: The right ventricular size is normal. No increase in right ventricular wall thickness. Right ventricular systolic function is normal. There is normal pulmonary artery systolic pressure. The tricuspid regurgitant velocity is 2.77 m/s, and  with an assumed right atrial pressure of 3 mmHg, the estimated right ventricular systolic pressure is 33.7 mmHg. Left Atrium: Left atrial size was normal in size. Right Atrium: Right  atrial size was normal in size. Pericardium: Trivial pericardial effusion is present. Presence of epicardial fat layer. Mitral Valve: The mitral valve is grossly normal. No evidence of mitral valve regurgitation. No evidence of mitral valve  Name: Chase Mathis MRN: [id number] DOB: December 18, 1933 89 y.o. PCP: Merlene Laughter, MD (Inactive)  Date of Admission: 04/20/2023  3:48 PM Date of Discharge: 04/21/2023 6:38 PM Attending Physician: Dr. Oswaldo Done  Discharge Diagnosis: Principal Problem:   TIA (transient ischemic attack) Active Problems:   Hypertension   Pure hypercholesterolemia   Stage 3a chronic kidney disease (HCC)   Internal carotid artery stenosis, right    Discharge Medications: Allergies as of 04/21/2023   No Known Allergies      Medication List     TAKE these medications    amLODipine 5 MG tablet Commonly known as: NORVASC Take 5 mg by mouth daily.   aspirin 81 MG chewable tablet Chew 1 tablet (81 mg total) by mouth daily.   carbidopa-levodopa 25-100 MG tablet Commonly known as: SINEMET IR TAKE 1 TABLET BY MOUTH 3 TIMES A DAY (6AM,11AM AND 4PM)   chlorhexidine 0.12 % solution Commonly known as: PERIDEX Use as directed 15 mLs in the mouth or throat 2 (two) times daily.   clopidogrel 75 MG tablet Commonly known as: PLAVIX Take 1 tablet (75 mg total) by mouth daily.   finasteride 5 MG tablet Commonly known as: PROSCAR Take 1 tablet (5 mg total) by mouth daily.   lisinopril 10 MG tablet Commonly known as: ZESTRIL Take 10 mg by mouth daily.   lithium carbonate 150 MG capsule Take 3 capsules (450 mg) by mouth at bedtime.   LORazepam 0.5 MG tablet Commonly known as: ATIVAN Take 1 tablet (0.5 mg total) by mouth at bedtime as needed for anxiety.   nortriptyline 50 MG capsule Commonly known as: PAMELOR Take 2 capsules (100 mg total) by mouth at bedtime. What changed: Another medication with the same name was removed. Continue taking this medication, and follow the directions you see here.   pyridoxine 250 MG tablet Commonly known as: B-6 Take 250 mg by mouth 2 (two) times daily.   rosuvastatin 20 MG tablet Commonly known as: CRESTOR Take 1 tablet (20 mg total) by mouth at  bedtime.        Disposition and follow-up:   Mr.Chase Mathis was discharged from [facility] in Stable condition.  At the hospital follow up visit please address:  1.  Follow-up: *Transient ischemic event *Severe stenosis of L vertebral artery *Severe stenosis of R ICA -Ensure adherence to medical management  -Patient to follow up with vascular surgery outpatient -HH PT to be continued outpatient  *Depression  *High risk medications *Medication management -Monitor Lithium  level at discharge -Ensure follow up with Dr. Jennelle Human for psychiatric medication management -Recommend blister packs for medication management  Medication Changes  Started  -Clopidogrel 75 mg x 21 days  -ASA 81 mg indefinitely  -Crestor 20 mg      Follow-up Appointments:  Follow-up Information     Connect with your PCP/Specialist as discussed. Schedule an appointment as soon as possible for a visit .   Contact information: [email or web address] Call our physician referral line at [phone].        [facility] Follow up.   Specialty: Home Health Services Why: for home health services Contact information: [address] [place] [address] [phone]         Victorino Sparrow, MD Follow up.   Specialty: Vascular Surgery Contact information: [address] [place] [address] [phone]                 Hospital Course by  Total Bilirubin 0.3 - 1.2 mg/dL   0.7   Alkaline Phos 38 - 126 U/L   55   AST 15 - 41 U/L   19   ALT 0 - 44 U/L   20     ECHOCARDIOGRAM COMPLETE  Result Date: 04/21/2023    ECHOCARDIOGRAM REPORT   Patient Name:   Chase Mathis Date of Exam: 04/21/2023 Medical Rec #:  [id number]      Height:       68.0 in Accession #:    [id number]     Weight:       211.6 lb Date of Birth:  1933/10/11      BSA:          2.093 m Patient Age:    89 years       BP:           199/79 mmHg Patient Gender: M              HR:           61 bpm. Exam Location:  Inpatient Procedure: 2D Echo, Color Doppler, Cardiac Doppler and Intracardiac            Opacification Agent Indications:    TIA  History:        Patient has no prior history of Echocardiogram examinations. CKD                 and TIA; Risk Factors:Dyslipidemia and Hypertension.  Sonographer:    Milbert Coulter Referring Phys: [id number] Marquita Palms VINCENT IMPRESSIONS  1. Left ventricular ejection fraction, by estimation, is  60 to 65%. The left ventricle has normal function. The left ventricle has no regional wall motion abnormalities. Left ventricular diastolic parameters are consistent with Grade I diastolic dysfunction (impaired relaxation).  2. Right ventricular systolic function is normal. The right ventricular size is normal. There is normal pulmonary artery systolic pressure. The estimated right ventricular systolic pressure is 33.7 mmHg.  3. The mitral valve is grossly normal. No evidence of mitral valve regurgitation. No evidence of mitral stenosis.  4. The aortic valve was not well visualized. Aortic valve regurgitation is mild. No aortic stenosis is present.  5. The inferior vena cava is normal in size with greater than 50% respiratory variability, suggesting right atrial pressure of 3 mmHg. Conclusion(s)/Recommendation(s): No intracardiac source of embolism detected on this transthoracic study. Consider a transesophageal echocardiogram to exclude cardiac source of embolism if clinically indicated. FINDINGS  Left Ventricle: Left ventricular ejection fraction, by estimation, is 60 to 65%. The left ventricle has normal function. The left ventricle has no regional wall motion abnormalities. Definity contrast agent was given IV to delineate the left ventricular  endocardial borders. The left ventricular internal cavity size was normal in size. There is no left ventricular hypertrophy. Left ventricular diastolic parameters are consistent with Grade I diastolic dysfunction (impaired relaxation). Right Ventricle: The right ventricular size is normal. No increase in right ventricular wall thickness. Right ventricular systolic function is normal. There is normal pulmonary artery systolic pressure. The tricuspid regurgitant velocity is 2.77 m/s, and  with an assumed right atrial pressure of 3 mmHg, the estimated right ventricular systolic pressure is 33.7 mmHg. Left Atrium: Left atrial size was normal in size. Right Atrium: Right  atrial size was normal in size. Pericardium: Trivial pericardial effusion is present. Presence of epicardial fat layer. Mitral Valve: The mitral valve is grossly normal. No evidence of mitral valve regurgitation. No evidence of mitral valve

## 2023-04-23 ENCOUNTER — Telehealth: Payer: Self-pay | Admitting: Vascular Surgery

## 2023-04-23 DIAGNOSIS — M25551 Pain in right hip: Secondary | ICD-10-CM | POA: Diagnosis not present

## 2023-04-23 DIAGNOSIS — M6281 Muscle weakness (generalized): Secondary | ICD-10-CM | POA: Diagnosis not present

## 2023-04-23 DIAGNOSIS — R2681 Unsteadiness on feet: Secondary | ICD-10-CM | POA: Diagnosis not present

## 2023-04-23 NOTE — Telephone Encounter (Signed)
Pt appt scheduled

## 2023-04-24 ENCOUNTER — Other Ambulatory Visit: Payer: Self-pay | Admitting: *Deleted

## 2023-04-24 DIAGNOSIS — I6521 Occlusion and stenosis of right carotid artery: Secondary | ICD-10-CM

## 2023-04-25 ENCOUNTER — Ambulatory Visit (HOSPITAL_COMMUNITY)
Admission: RE | Admit: 2023-04-25 | Discharge: 2023-04-25 | Disposition: A | Discharge status: Home / Self Care | Payer: Medicare Other | Source: Ambulatory Visit | Attending: Vascular Surgery | Admitting: Vascular Surgery

## 2023-04-25 DIAGNOSIS — M6281 Muscle weakness (generalized): Secondary | ICD-10-CM | POA: Diagnosis not present

## 2023-04-25 DIAGNOSIS — I6521 Occlusion and stenosis of right carotid artery: Secondary | ICD-10-CM | POA: Diagnosis not present

## 2023-04-25 DIAGNOSIS — M25551 Pain in right hip: Secondary | ICD-10-CM | POA: Diagnosis not present

## 2023-04-25 DIAGNOSIS — R2681 Unsteadiness on feet: Secondary | ICD-10-CM | POA: Diagnosis not present

## 2023-04-26 ENCOUNTER — Ambulatory Visit (INDEPENDENT_AMBULATORY_CARE_PROVIDER_SITE_OTHER): Payer: Medicare Other | Admitting: Vascular Surgery

## 2023-04-26 ENCOUNTER — Encounter: Payer: Self-pay | Admitting: Vascular Surgery

## 2023-04-26 VITALS — BP 154/77 | HR 73 | Temp 98.2°F | Resp 20 | Ht 68.0 in | Wt 207.0 lb

## 2023-04-26 DIAGNOSIS — I6521 Occlusion and stenosis of right carotid artery: Secondary | ICD-10-CM

## 2023-04-27 DIAGNOSIS — Z8673 Personal history of transient ischemic attack (TIA), and cerebral infarction without residual deficits: Secondary | ICD-10-CM | POA: Diagnosis not present

## 2023-04-27 DIAGNOSIS — Z79899 Other long term (current) drug therapy: Secondary | ICD-10-CM | POA: Diagnosis not present

## 2023-04-27 DIAGNOSIS — R4189 Other symptoms and signs involving cognitive functions and awareness: Secondary | ICD-10-CM | POA: Diagnosis not present

## 2023-04-27 NOTE — Progress Notes (Signed)
Office Note     HPI: Chase Mathis is a 89 y.o. (1934-02-09) male presenting for hospital follow-up after recent neurologic episode.   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  He has struggled with depression, and is currently treated with lithium which she has had issues within the past.  Per his son, when lithium levels are low, Chase Mathis goes into a state where he is unable to walk, unable to move.  This has happened 5 times.  He presented to the hospital last week with an episode similar, however slurred speech was also appreciated.  He had a stroke workup demonstrating 80% stenosis of the right internal carotid artery with concern for symptomatic ICA disease.  Head imaging was negative.  Vascular surgery was called for further recommendations.  I had a long conversation with him in the hospital regarding his symptoms, and imaging.  We elected to discuss things further in the outpatient setting. Exam today, Chase Mathis was doing well, accompanied by his wife and son.  He has had no further issues.  Past Medical History:  Diagnosis Date   Benign non-nodular prostatic hyperplasia with lower urinary tract symptoms 12/21/2014   Chronic sinusitis 08/23/2017   Combined forms of age-related cataract of both eyes 06/15/2016   Elevated prostate specific antigen (PSA) 07/03/2011   Epiretinal membrane, bilateral 06/03/2015   Epistaxis 08/16/2017   History of electroconvulsive therapy    Hypertension    Left ureteral stone 01/28/2015   Lithium-induced tremor    Major depressive disorder in remission    MCI (mild cognitive impairment) with memory loss 01/05/2021   Peripheral neuropathy    Recurrent nephrolithiasis 09/02/2017   Vitreous syneresis of both eyes 06/03/2015    Past Surgical History:  Procedure Laterality Date   CYSTOSCOPY WITH RETROGRADE PYELOGRAM, URETEROSCOPY AND STENT PLACEMENT Left 01/28/2015   Procedure: CYSTOSCOPY WITH LEFT  RETROGRADE PYELOGRAM, LEFT URETEROSCOPY/STONE BASKETING  AND LEFT URETERAL  STENT PLACEMENT;  Surgeon: Jethro Bolus, MD;  Location: [facility];  Service: Urology;  Laterality: Left;   SINUS IRRIGATION      Social History   Socioeconomic History   Marital status: Married    Spouse name: Not on file   Number of children: Not on file   Years of education: 20   Highest education level: Professional school degree (e.g., MD, DDS, DVM, JD)  Occupational History   Occupation: retired    Comment: urologist  Tobacco Use   Smoking status: Never   Smokeless tobacco: Never  Substance and Sexual Activity   Alcohol use: Yes    Comment: very rare   Drug use: No   Sexual activity: Yes  Other Topics Concern   Not on file  Social History Narrative   Right handed   Drinks caffeine    Two story home   Social Determinants of Health   Financial Resource Strain: Not on file  Food Insecurity: No Food Insecurity (04/21/2023)   Hunger Vital Sign    Worried About Running Out of Food in the Last Year: Never true    Ran Out of Food in the Last Year: Never true  Transportation Needs: No Transportation Needs (04/21/2023)   PRAPARE - Administrator, Civil Service (Medical): No    Lack of Transportation (Non-Medical): No  Physical Activity: Not on file  Stress: Not on file  Social Connections: Unknown (11/07/2021)   [facility], [facility]   Social Network  Social Network: Not on file  Intimate Partner Violence: Not At Risk (04/21/2023)   Humiliation, Afraid, Rape, and Kick questionnaire    Fear of Current or Ex-Partner: No    Emotionally Abused: No    Physically Abused: No    Sexually Abused: No   Family History  Problem Relation Age of Onset   Lung cancer Mother    Heart disease Father    Pancreatic cancer Sister    Healthy Son    Healthy Daughter     Current Outpatient Medications  Medication Sig Dispense Refill   amLODipine (NORVASC) 5 MG tablet  Take 5 mg by mouth daily.     aspirin 81 MG chewable tablet Chew 1 tablet (81 mg total) by mouth daily.     carbidopa-levodopa (SINEMET IR) 25-100 MG tablet TAKE 1 TABLET BY MOUTH 3 TIMES A DAY (6AM,11AM AND 4PM) 270 tablet 0   chlorhexidine (PERIDEX) 0.12 % solution Use as directed 15 mLs in the mouth or throat 2 (two) times daily.     clopidogrel (PLAVIX) 75 MG tablet Take 1 tablet (75 mg total) by mouth daily. 21 tablet 0   finasteride (PROSCAR) 5 MG tablet Take 1 tablet (5 mg total) by mouth daily. 90 tablet 3   lisinopril (PRINIVIL,ZESTRIL) 10 MG tablet Take 10 mg by mouth daily.     lithium carbonate 150 MG capsule Take 3 capsules (450 mg) by mouth at bedtime. 270 capsule 0   LORazepam (ATIVAN) 0.5 MG tablet Take 1 tablet (0.5 mg total) by mouth at bedtime as needed for anxiety. 30 tablet 2   nortriptyline (PAMELOR) 50 MG capsule Take 2 capsules (100 mg total) by mouth at bedtime. 180 capsule 0   pyridoxine (B-6) 250 MG tablet Take 250 mg by mouth 2 (two) times daily.     rosuvastatin (CRESTOR) 20 MG tablet Take 1 tablet (20 mg total) by mouth at bedtime. 30 tablet 3   No current facility-administered medications for this visit.    No Known Allergies   REVIEW OF SYSTEMS:  [X]  denotes positive finding, [ ]  denotes negative finding Cardiac  Comments:  Chest pain or chest pressure:    Shortness of breath upon exertion:    Short of breath when lying flat:    Irregular heart rhythm:        Vascular    Pain in calf, thigh, or hip brought on by ambulation:    Pain in feet at night that wakes you up from your sleep:     Blood clot in your veins:    Leg swelling:         Pulmonary    Oxygen at home:    Productive cough:     Wheezing:         Neurologic    Sudden weakness in arms or legs:     Sudden numbness in arms or legs:     Sudden onset of difficulty speaking or slurred speech:    Temporary loss of vision in one eye:     Problems with dizziness:         Gastrointestinal     Blood in stool:     Vomited blood:         Genitourinary    Burning when urinating:     Blood in urine:        Psychiatric    Major depression:         Hematologic    Bleeding problems:    Problems  with blood clotting too easily:        Skin    Rashes or ulcers:        Constitutional    Fever or chills:      PHYSICAL EXAMINATION:  Vitals:   04/26/23 1422 04/26/23 1424  BP: (!) 159/77 (!) 154/77  Pulse: 73   Resp: 20   Temp: 98.2 F (36.8 C)   SpO2: 98%   Weight: 207 lb (93.9 kg)   Height: 5\' 8"  (1.727 m)     General:  WDWN in NAD; vital signs documented above Gait: Not observed HENT: WNL, normocephalic Pulmonary: normal non-labored breathing , without wheezing Cardiac: regular HR Abdomen: soft, NT, no masses Skin: without rashes Vascular Exam/Pulses:  Right Left  Radial 2+ (normal) 2+ (normal)                       Extremities: without ischemic changes, without Gangrene , without cellulitis; without open wounds;  Musculoskeletal: no muscle wasting or atrophy  Neurologic: A&O X 3;  No focal weakness or paresthesias are detected Psychiatric:  The pt has Normal affect.   Non-Invasive Vascular Imaging:   CT imaging demonstrating 80% stenosis  Duplex ultrasound demonstrating less than 80% stenosis    ASSESSMENT/PLAN: Chase Mathis is a 89 y.o. male presenting status post neurologic event.  I had a long conversation with him and his family in which we revisited the symptoms that led to his hospitalization.  None of his symptoms were consistent with stroke except for slurred speech.  No lesions on MRI.  While I cannot rule out TIA, symptoms are consistent with episodes he has had previously with low lithium levels per his son.  Imaging was reviewed, and the CT appears to over-call stenosis as shadowing from his prior dental procedures impacts opacification of the ICA.  This can be appreciated in the coronal and sagittal views.  Carotid duplex  ultrasound demonstrates stenosis less than 80%. I feel comfortable calling his right sided ICA stenosis asymptomatic at this time.  With less than 80% stenosis, operative intervention is not indicated.  My plan is to follow him with carotid duplex ultrasounds to ensure he does not have progression.  We revisited the signs and symptoms of stroke, TIA, amaurosis.  He was asked to call 911 should any of these occur.   Victorino Sparrow, MD [facility] [phone]. Total time of patient care including pre-visit research, consultation, and documentation greater than 40 minutes

## 2023-05-04 ENCOUNTER — Other Ambulatory Visit: Payer: Self-pay | Admitting: Student

## 2023-05-09 ENCOUNTER — Encounter: Payer: Self-pay | Admitting: Psychiatry

## 2023-05-14 ENCOUNTER — Other Ambulatory Visit: Payer: Self-pay

## 2023-05-14 DIAGNOSIS — I6521 Occlusion and stenosis of right carotid artery: Secondary | ICD-10-CM

## 2023-05-29 ENCOUNTER — Other Ambulatory Visit: Payer: Self-pay | Admitting: Psychiatry

## 2023-05-29 DIAGNOSIS — F3342 Major depressive disorder, recurrent, in full remission: Secondary | ICD-10-CM

## 2023-06-01 ENCOUNTER — Other Ambulatory Visit: Payer: Self-pay | Admitting: Psychiatry

## 2023-06-08 DIAGNOSIS — E7800 Pure hypercholesterolemia, unspecified: Secondary | ICD-10-CM | POA: Diagnosis not present

## 2023-06-08 DIAGNOSIS — Z79899 Other long term (current) drug therapy: Secondary | ICD-10-CM | POA: Diagnosis not present

## 2023-06-08 DIAGNOSIS — Z8673 Personal history of transient ischemic attack (TIA), and cerebral infarction without residual deficits: Secondary | ICD-10-CM | POA: Diagnosis not present

## 2023-06-11 ENCOUNTER — Other Ambulatory Visit: Payer: Self-pay | Admitting: Student

## 2023-07-02 ENCOUNTER — Telehealth: Payer: Self-pay | Admitting: Neurology

## 2023-07-02 NOTE — Telephone Encounter (Signed)
 Let pt know that I got some type of surgical clearance for him for his hip.  I can't fill this out.  Pt apparently was in hospital in October with possible TIA and I knew nothing about this.  His pcp will need to fill out or he will need appointment .

## 2023-07-05 DIAGNOSIS — M160 Bilateral primary osteoarthritis of hip: Secondary | ICD-10-CM | POA: Diagnosis not present

## 2023-07-05 NOTE — Progress Notes (Signed)
 Assessment/Plan:   1.  Gait instabity  -Peripheral neuropathy likely contributes.  He is on a B12 supplement.             -Patient is also on nortriptyline , which is an anticholinergic and certainly can contribute to gait instability, but he reports its needed for control of his depression  -Skin biopsy negative for alpha-synuclein in August, 2023  -skin bx was pos for PN which could produce gait instability  -DaTscan  did demonstrate decreased uptake in the left caudate more than the right and mild decreased activity in the left posterior stratum.  -Patient's case is very complex, partially because the presence of tremor inducing agents, and because of the fact that his skin biopsy has been negative and DaTscan  has been marginally positive.  Clinically, I do not think that the patient has idiopathic Parkinsons Disease.    -MRI brain done in February, 2024 with evidence of mild small vessel disease only.  -recommend walker at all times.  He is more unstable than in the past and I worry about falls/consequence of them.  Discussed this today in detail   2.  B12 deficiency             -Patient now on supplementation.   3.  Tremor             -This is likely due to the lithium , and we discussed that again today.  He has both intention tremor (likely from lithium ), but he has intermittent rest tremor as well (I have seen this on both sides independently). This has been fairly stable over the years   4.  Memory change  -Neurocognitive testing done January 04, 2021 with evidence of MCI only.  Felt that this could be due to medication (nortriptyline , lithium ).  This may have progressed with time.  -son in law states that he had what sounded like neuropsych testing recently.  It sounds like they did this to see if he would qualify for medication management at [facility] and he was told that he would not because he only had mild cognitive impairment.  Based on his evaluation today, that surprised me a  bit, as we had talked about doing repeating his neurocognitive testing, which is how this conversation arose.  His son-in-law is going to try to get me a copy of that, but I still think that it may behoove us  to repeat that.  The patient even had difficulty remembering the TIA that he recently had when I first walked into the room, which should be an event that he should be able to easily remember.  5.  Carotid stenosis, left  -Was 80% via CTA and less than 50% via carotid ultrasound.  He is following with vascular surgery and they felt that the ultrasound was more accurate.  There is certainly a significant discrepancy between the 2 and generally CTA is thought to be more sensitive, but nonetheless the patient is closely following with vascular surgery.  Patient is currently on baby aspirin  only.  He was on Plavix /aspirin  together for 21 days following his recent TIA.  -Patient also with severe left vertebral stenosis  -Findings above were incidental and not associated with TIA symptoms.  6.  TIA  -Occurred October, 2024  -Symptoms consisted of speech and balance change  -MRI was negative  -Patient was on Plavix  and aspirin  for 21 days and now on aspirin , 81 mg alone.  -Patient on Crestor , 20 mg daily.  LDL is currently 84.  Goal is less than 70.  Crestor  was added at the time of the event.  7.  Hip pain  -We have gotten a clearance form for upcoming hip surgery.  We discussed with him that we do not provide clearance, but rather neurologic optimization for surgery.  We discussed with the patient that we would not want him having hip surgery this close to his TIA and ideally this would need to be at least 3 months (if not 6 months out from the event).  I did tell patient and son-in-law that he would be at moderate risk, but would be able to have surgery after July 21, 2023.  He is currently scheduled for surgery for February.  Discussed with him that I will fill out his forms, but he would need  clearance from the vascular surgeon given the carotid and vertebral stenosis.  He reports that he has an appointment with vascular surgery January 30.  I was able to confirm that.  His surgery is to be done under epidural block, per patient and son-in-law.  Subjective:   Chase Mathis was seen today in neurologic follow-up.  Pt with son in law, Chase Mathis, who supplements the history.  Records reviewed since last visit.  Patient was in the emergency room October 25 after an episode of slurred speech.  Patient went to bed normal.  He woke up before his wife, but when wife woke up she noted slurred speech.  This went on for over an hour after she got up.  There was also gait instability; while standing at the sink he had to hold onto the sink, but seemed to be walking with his cane in the left hand with no problem.  Patient symptoms had resolved before he got to the emergency room.  Patient was evaluated by the stroke team.  Dr. Jerri saw the patient and his notes are reviewed.  Dr. Jerri was not completely convinced that this was TIA or stroke related given that family had reported he had had 2 episodes of this in the past, once when lithium  level was low and another 1 nortriptyline  level was low, but out of abundance of caution, did call this a TIA.  Patient's blood pressure was elevated in the emergency room, but not terribly so.  Aspirin , 81 mg was felt to be prudent lifelong.  Plavix  was added for 21 days.  Crestor , 20 mg was added (LDL was 84).  Patient had critical right ICA stenosis, but this was felt to be incidental and unrelated to patient's symptoms.  Patient had 80% proximal ICA stenosis.  There was also severe stenosis of the left vertebral artery.  Patient's amlodipine  was increased from 5 mg to 10 mg.  His hemoglobin A1c was good at 5.9.  Patient did follow-up with vascular surgery on October 31.  They did a carotid ultrasound (was previously CTA) and this demonstrated less than 50% stenosis.  Separately, pt  is hoping to have hip surgery.  Hip pain started few months ago - trouble getting up stairs with the R leg. No stairs into the home.   Pt is no longer driving.  He is using uber.  They have increased his care at [facility].  Son in law states that they had what sounds like neurocog testing and dx with only MCI.  This was done because they were hoping to have medication management paid for, but they were declined because of the diagnosis of only MCI.  He does, however, have medication  management.  Prior to that, he was not able to properly manage medications per son-in-law.  Current medication: Carbidopa /levodopa  25/100, 1 tablet 3 times per day  ALLERGIES:  No Known Allergies  CURRENT MEDICATIONS:  Outpatient Encounter Medications as of 07/06/2023  Medication Sig   amLODipine  (NORVASC ) 5 MG tablet Take 5 mg by mouth daily.   aspirin  81 MG chewable tablet Chew 1 tablet (81 mg total) by mouth daily.   chlorhexidine  (PERIDEX ) 0.12 % solution Use as directed 15 mLs in the mouth or throat 2 (two) times daily.   clopidogrel  (PLAVIX ) 75 MG tablet Take 1 tablet (75 mg total) by mouth daily.   finasteride  (PROSCAR ) 5 MG tablet Take 1 tablet (5 mg total) by mouth daily.   lisinopril  (PRINIVIL ,ZESTRIL ) 10 MG tablet Take 10 mg by mouth daily.   lithium  carbonate 150 MG capsule TAKE 3 CAPSULES (450 MG TOTAL) BY MOUTH AT BEDTIME.   LORazepam  (ATIVAN ) 0.5 MG tablet TAKE 1 TABLET BY MOUTH AT BEDTIME AS NEEDED FOR ANXIETY.   nortriptyline  (PAMELOR ) 50 MG capsule Take 2 capsules (100 mg total) by mouth at bedtime.   pyridoxine  (B-6) 250 MG tablet Take 250 mg by mouth 2 (two) times daily.   rosuvastatin  (CRESTOR ) 20 MG tablet Take 1 tablet (20 mg total) by mouth at bedtime.   [DISCONTINUED] carbidopa -levodopa  (SINEMET  IR) 25-100 MG tablet TAKE 1 TABLET BY MOUTH 3 TIMES A DAY (6AM,11AM AND 4PM) (Patient not taking: Reported on 07/06/2023)   No facility-administered encounter medications on file as of 07/06/2023.     Objective:   PHYSICAL EXAMINATION:    VITALS:   Vitals:   07/06/23 1147  BP: 138/78  Pulse: 74  SpO2: 96%  Weight: 210 lb (95.3 kg)  Height: 5' 8 (1.727 m)      GEN:  Normal appears male in no acute distress.  Appears younger than stated age. HEENT:  Normocephalic, atraumatic. The mucous membranes are moist.  Cardiovascular: Regular rate rhythm Lungs: Clear to auscultation bilaterally  NEUROLOGICAL: Orientation: The patient is alert and oriented x3.  He is, however, a bit confused today.  Relies on son-in-law for finer aspects of the history.  As above, he did not recall his TIA symptoms initially.  He has difficulty with pointed questions and circumvents them. Cranial nerves: There is good facial symmetry.  Extraocular muscles are intact. The visual fields are full to confrontational testing. The speech is fluent and clear. Soft palate rises symmetrically and there is no tongue deviation. Hearing is intact to conversational tone. Sensation: Sensation is intact to light touch throughout. Motor: Strength is 5/5 in the bilateral upper and lower extremities.   Movement examination: Tone: There is nl tone today Abnormal movements: There is rare rest tremor in the left upper extremity. Coordination:  There is no significant decremation with any form of RAMS, including alternating supination and pronation of the forearm, hand opening and closing, finger taps, heel taps and toe taps.  Gait and Station: The patient pushes off to arise.  He is unsteady.  He is short stepped   Chemistry      Component Value Date/Time   NA 139 04/21/2023 0251   NA 143 06/22/2022 0849   K 4.5 04/21/2023 0251   CL 105 04/21/2023 0251   CO2 26 04/21/2023 0251   BUN 22 04/21/2023 0251   BUN 19 06/22/2022 0849   CREATININE 1.25 (H) 04/21/2023 0251   CREATININE 1.18 (H) 04/18/2018 0937      Component Value Date/Time  CALCIUM  9.4 04/21/2023 0251   ALKPHOS 55 04/20/2023 1606   AST 19 04/20/2023  1606   ALT 20 04/20/2023 1606   BILITOT 0.7 04/20/2023 1606     Lab Results  Component Value Date   VITAMINB12 >2000 (H) 09/16/2021    Total time spent on today's visit was 55 minutes, including both face-to-face time and nonface-to-face time.  Time included that spent on review of records (prior notes available to me/labs/imaging if pertinent), discussing treatment and goals, answering patient's questions and coordinating care.    Cc:  Roseann Coad, MD (Inactive)

## 2023-07-06 ENCOUNTER — Ambulatory Visit (INDEPENDENT_AMBULATORY_CARE_PROVIDER_SITE_OTHER): Payer: Medicare Other | Admitting: Neurology

## 2023-07-06 VITALS — BP 138/78 | HR 74 | Ht 68.0 in | Wt 210.0 lb

## 2023-07-06 DIAGNOSIS — G459 Transient cerebral ischemic attack, unspecified: Secondary | ICD-10-CM | POA: Diagnosis not present

## 2023-07-06 DIAGNOSIS — R413 Other amnesia: Secondary | ICD-10-CM

## 2023-07-06 DIAGNOSIS — R2681 Unsteadiness on feet: Secondary | ICD-10-CM | POA: Diagnosis not present

## 2023-07-10 ENCOUNTER — Other Ambulatory Visit: Payer: Self-pay | Admitting: Neurology

## 2023-07-16 ENCOUNTER — Ambulatory Visit: Payer: Medicare Other | Admitting: Psychiatry

## 2023-07-16 ENCOUNTER — Encounter: Payer: Self-pay | Admitting: Psychiatry

## 2023-07-16 DIAGNOSIS — Z79899 Other long term (current) drug therapy: Secondary | ICD-10-CM | POA: Diagnosis not present

## 2023-07-16 DIAGNOSIS — G251 Drug-induced tremor: Secondary | ICD-10-CM | POA: Diagnosis not present

## 2023-07-16 DIAGNOSIS — F5105 Insomnia due to other mental disorder: Secondary | ICD-10-CM | POA: Diagnosis not present

## 2023-07-16 DIAGNOSIS — G3184 Mild cognitive impairment, so stated: Secondary | ICD-10-CM

## 2023-07-16 DIAGNOSIS — F331 Major depressive disorder, recurrent, moderate: Secondary | ICD-10-CM

## 2023-07-16 NOTE — Progress Notes (Signed)
Chase Mathis [id number] 01-12-1934 89 y.o.  Subjective:   Patient ID:  Chase Mathis is a 89 y.o. (DOB [birth year]) male.  Chief Complaint:  No chief complaint on file.   Depression        Associated symptoms include fatigue.  Associated symptoms include no decreased concentration and no suicidal ideas.  Chase Mathis presents to the office today for follow-up of recurrence of depression that occurred at the end of 2019.  He responded to an increase in lithium from 300 to 450 mg daily..  [name] had about a 4-year history of treatment resistant major depression that ultimately responded to ECT in 2009.  He had an early relapse while on nortriptyline alone and lithium was added and he had booster ECTs and has been in remission since that time.  seen in November 2019.  His depression had gone into remission again with increase in lithium from 300 to 450 mg daily.  No med changes were made at that time.d  02/2019 appt : Continue lithium 150 mg capsules 3 daily Continue nortriptyline 50 mg capsules 2 nightly  11/30/20 lab and TC ;His nortriptyline level is 303 and should be 150 or lower.  He is on 2 of the 50 mg capsules.  Tell him to cut the dose back to one of the 50 mg capsules.  He has an appointment in August.  Let them know if he has any problem with the dosage reduction and to let us know.  02/10/2021 appointment following noted: No problems with less nortriptyline to 50 mg nightly. Fine until 2-3 mos ago.  Was working in back yard and got weak and fell and couldn't get up.  Hasn't happened again. Saw neuro Dr. Arbutus Leas. No PD and probably lithium tremor.  Equivical DaTscan. Had neuropsych testing and dx with [facility]. No further episodes of this.   I'm fine now.  Remained in remission.  Patient reports stable mood and denies depressed or irritable moods.  Patient denies any recent difficulty with anxiety.  Patient denies difficulty with sleep initiation but occ maintenance with work dreams  with anxiety.  Benadryl helps. Denies appetite disturbance.  Patient reports that energy and motivation have been good.  Patient denies any difficulty with concentration.  Patient denies any suicidal ideation. Minimal tremor.  04/25/2021 appointment with the following noted: Did not get lithium level and labs as requested. Moved [facility], [facility].  Didn't think it would be a problem.  Small place.  Younger than others.He doesn't like it bc of the change similar to what he had after moving to [place] years ago leading to a severe prolonged depression.  Feels he made a mistake but trying to ease up on his thinking bc he realizes it's not all about him. Dispersed his furniture.  Thinks he's adjusting better.   Once weekly goes to [employer]'s [employer] mixed [employer].  Involved in other things.   Plan: Continue lithium 150 mg capsules 3 daily Continue nortriptyline 50 mg capsules 1 nightly Check lithium level  09/12/2021 phone call wanting to be worked in sooner due to anxiety.  09/15/2021 appointment with the following noted: seen with wife Chase Mathis Nortriptyline level ordered February 10, 2021 never completed Moved to [facility] several months ago and having trouble adjusting despite 6 years. Can't sleep and trouble with anxiety.  Trouble adjusting.  Can't shut his mind off.   Most stressful is per wife it's crowded and small space.  She feels the  same way about it.  She also feels it is a small space also.   She is struggling with some depression about it. Too. Living at [facility].   He needs an office and a place to think and has an office. Usually to gym several times a week and walking with wife a couple of times per week.   Feels a need to make the place work.   Did this mainly to please the kids.   Consistent with nortriptyline 50 mg HS and lithium 450 but wife says she thinks he misses some of it. No SE. Mainly initial insomnia.  2-3  hours nightly.Naps an hour and she thinks he  might nap a little more. Active at church and in choir.  First [employer]. Gradually harder to do things.   Plan: Continue lithium 150 mg capsules 3 daily Continue nortriptyline 50 mg capsules 1 nightly Check lithium and nortriptyline levels. Needs something for sleep.  Lorazepam 0.5-1.0  mg HS  09/16/2021 lab note: Patient has relapsed with depression and anxiety and this is related to insufficient blood levels of his meds due to noncompliance: His nortriptyline level is less than 20 which means it is not detectable with a prescribed dose of 50 mg nightly.  In June 2020 his nortriptyline level was 303 on 100 mg daily and we reduced it to 50 mg daily.  Optimal levels are 50 to 150 ng/ml  This is highly suggestive that he is not taking nortriptyline on a regular basis.  He is missing a number of dosages.  At his last appointment his wife indicated she felt he was missing significant dosages.   His lithium level was also low and that was recently increased from 3 of the 150 mg capsules to 4 of the 150 mg capsules.  I think it was low because he was missing dosages of that as well.  He needs to use a pillbox to improve compliance.  He will not get better if he does not take his medicine. Tell him to increase nortriptyline to 75 mg at night.  I will send in prescription for 3 of the 25 mg capsules. Continue lithium 600 mg nightly.  Repeat his blood levels after about a week. I sent in a orders to [facility]. He can use the lorazepam 0.5 mg 1 twice daily for anxiety and 1-2 at night for sleep.  But this will not solve his symptoms until he is more compliant with nortriptyline and lithium as indicated in the other note sent today.  10/05/21 lab noted: On nortriptyline 75 mg nightly level was still low at 49.  Therefore nortriptyline increased to 100 mg nightly.  He needs to be very compliant with this and the lithium level which had also been low.  10/14/2021 appointment with the following  noted:  with wife Chase Mathis I'm better.  Rx lorazepam helped improve sleep taking it BID Disc noncompliance which led to relapse. Wife and D in law are making sure he is taking the meds. But not using the pill box. Admits he was neglecting himself and doesn't know why that was necessary.   Feels much better.  Goes to gym and wants to do it again and didn't want to do it when depressed.   Sleep and less anxious.  More interest in things.  More enjoyment. Increased tremor and dropping some pills Appetite had gone down over a couple of mos and lost 20# but appetite has returned. Wife notices he  is having some mild inattentiveness and forgetfulness.  11/29/21 appt noted: Tolerated in crease meds. Ativan helped sleep and insomnia triggered the problems . Lihtium tremor is mild Sing in choir. More consistent with meds and no mistakes. Out of the depression and anxiety Plan: Continue lithium 150 mg capsules 4 daily Continue nortriptyline 75 mg capsules 1 nightly Check lithium and nortriptyline levels again.   06/14/22 appt noted: Normal B12 in March 2023 Dr. Arbutus Leas dx periperal neuropathy and likely PD and also ikely lithium tremor.  Sinemet hasn't helped tremor. Not a severe tremor but is annoying. Mood is good and Chase Mathis agrees .  Not that anxious and hasn't needed lorazepam.   Sleep is good.   Consistent. [facility] resident. Plan: much better again with consistency of : Continue lithium 150 mg capsules 4 daily Continue nortriptyline 75 mg capsules 1 nightly Check lithium and nortriptyline levels again.  [facility] sent [facility].  Discussed the risk of toxicity due to recent unstable blood levels.  Will try to reduce if possible.  08/03/22 TC wife:  Chase Mathis, wife, asks if he can come off of the lorazepam? He is too sleepy and sleeping way too much. Gets up early and is going back to bed at 8 or 9 and sleeping a few hours again.    MD resp:  I thought I sent a response to this but tell her or  him to cut the dose by 50% for 2 weeks and see how he does.  If he's slepeing ok then stop it.     09/13/22 appt noted; "I'm fine".  Still some tremor.  No recent falls.  Exercising.   Seeing Dr. Arbutus Leas.  Concerns about possible PD and has peripheral neuropathy.  Concerns about this but not overly worried.  Sleeping ok.  No problems off lorazpem. Attends  [employer].    01/04/23 appt noted:89 yo and tremor more More difficulty sleeping thinking of stupid things. Has awoken thinking of surgery . Gradually getting better with dep and anxiety but did have more sx in the last couple of mos. Can't let go of bad stuff but no reason to hang on to it is part of the problem. Plan: Increase nortriptyline to 100 mg nightly  (4 of the 25 mg capsules or 2 of the 50 mg capsules nightly) Reduce lithium to 3 of the 150 mg capsules which should help the tremor B 6 vitamin 250 mg twice daily to see if tremor is better.  Try a month and if not better stop it.  02/05/23 appt noted: Tremor better with less lithium  to 450 mg .  Pretty much gone. Hasn't started B6. Thinks he is fine now with mood and anxiety.   Total 3 episodes depression and out of it again.  Last one is the most minor.   Some balance issues.  Still singing at church.  No sig SE nortriptyline.  Not aware of anything.   Wife also sees him better. Plan: continue nortriptyline 100 mg HS and lithium 450 HS.  Asks for prn lorazepam for sleep 0.5 mg HS  03/15/23 appt noted: Hip pain and using cane. Meds: continue nortriptyline 100 mg HS and lithium 450 HS.  Asks for prn lorazepam for sleep 0.5 mg HS or anxiety, B 6 250 mg BID for tremor. Tremor is better.  Not gone.  Doing good with dep. Aging issues with pain and some forgetfulness.   Stress having to support older son who is not  consistent with work. Happy with [facility].  Good senior community.  Going to PT 3 times weekly.    07/16/23 appt noted: Pending TKR in Feb. Doing  fine with mood.  Busy.  [facility] good place.  Active at church still.   Consistent with meds better.  [facility] administers meds for him.  Fully recovered from TIA.   Body affected by age. Satisfied with meds.   Still having trouble with sleep with lorazepam but better with it.   Sister died pancreatic CA and parents died earlier  No longer driving.  Gave it up.   Tolerating meds.  Past Psychiatric Medication Trials:  ECT Paroxetine 60, duloxetine anxiety, sertraline 3 weeks, Lexapro 10 x 30 days, Effexor 225 Abilify 5 no response Quetiapine Geodon Adderall trazodone November 2018, we reduced the nortriptyline from 150 mg to 100 mg daily and reduced the lithium from 450 mg a day to 300 mg daily because of some tremor and because he wanted to try to reduce the medication.  His blood levels on the higher dosages were lithium 0.7 nortriptyline 103.   On the lower dosages the level in October were:  Li 0.4.  Nortriptyline 224.  Review of Systems:  Review of Systems  Constitutional:  Positive for fatigue.  Cardiovascular:  Negative for palpitations.  Gastrointestinal:  Negative for diarrhea.  Musculoskeletal:  Positive for arthralgias and gait problem.  Neurological:  Negative for dizziness, tremors and weakness.  Psychiatric/Behavioral:  Negative for agitation, behavioral problems, confusion, decreased concentration, dysphoric mood, hallucinations, self-injury, sleep disturbance and suicidal ideas. The patient is not nervous/anxious and is not hyperactive.     Medications: I have reviewed the patient's current medications.  Current Outpatient Medications  Medication Sig Dispense Refill   amLODipine (NORVASC) 5 MG tablet Take 5 mg by mouth daily.     aspirin 81 MG chewable tablet Chew 1 tablet (81 mg total) by mouth daily.     chlorhexidine (PERIDEX) 0.12 % solution Use as directed 15 mLs in the mouth or throat 2 (two) times daily.     clopidogrel (PLAVIX) 75 MG tablet Take 1  tablet (75 mg total) by mouth daily. 21 tablet 0   finasteride (PROSCAR) 5 MG tablet Take 1 tablet (5 mg total) by mouth daily. 90 tablet 3   lisinopril (PRINIVIL,ZESTRIL) 10 MG tablet Take 10 mg by mouth daily.     lithium carbonate 150 MG capsule TAKE 3 CAPSULES (450 MG TOTAL) BY MOUTH AT BEDTIME. 270 capsule 0   LORazepam (ATIVAN) 0.5 MG tablet TAKE 1 TABLET BY MOUTH AT BEDTIME AS NEEDED FOR ANXIETY. 30 tablet 2   nortriptyline (PAMELOR) 50 MG capsule Take 2 capsules (100 mg total) by mouth at bedtime. 180 capsule 0   pyridoxine (B-6) 250 MG tablet Take 250 mg by mouth 2 (two) times daily.     rosuvastatin (CRESTOR) 20 MG tablet Take 1 tablet (20 mg total) by mouth at bedtime. 30 tablet 3   No current facility-administered medications for this visit.    Medication Side Effects: None  Allergies: No Known Allergies  Past Medical History:  Diagnosis Date   Benign non-nodular prostatic hyperplasia with lower urinary tract symptoms 12/21/2014   Chronic sinusitis 08/23/2017   Combined forms of age-related cataract of both eyes 06/15/2016   Elevated prostate specific antigen (PSA) 07/03/2011   Epiretinal membrane, bilateral 06/03/2015   Epistaxis 08/16/2017   History of electroconvulsive therapy    Hypertension    Left ureteral stone 01/28/2015   Lithium-induced  tremor    Major depressive disorder in remission    MCI (mild cognitive impairment) with memory loss 01/05/2021   Peripheral neuropathy    Recurrent nephrolithiasis 09/02/2017   Vitreous syneresis of both eyes 06/03/2015    Family History  Problem Relation Age of Onset   Lung cancer Mother    Heart disease Father    Pancreatic cancer Sister    Healthy Son    Healthy Daughter     Social History   Socioeconomic History   Marital status: Married    Spouse name: Not on file   Number of children: Not on file   Years of education: 20   Highest education level: Professional school degree (e.g., MD, DDS, DVM, JD)   Occupational History   Occupation: retired    Comment: urologist  Tobacco Use   Smoking status: Never   Smokeless tobacco: Never  Substance and Sexual Activity   Alcohol use: Yes    Comment: very rare   Drug use: No   Sexual activity: Yes  Other Topics Concern   Not on file  Social History Narrative   Right handed   Drinks caffeine    Two story home   Social Drivers of Health   Financial Resource Strain: Not on file  Food Insecurity: No Food Insecurity (04/21/2023)   Hunger Vital Sign    Worried About Running Out of Food in the Last Year: Never true    Ran Out of Food in the Last Year: Never true  Transportation Needs: No Transportation Needs (04/21/2023)   PRAPARE - Administrator, Civil Service (Medical): No    Lack of Transportation (Non-Medical): No  Physical Activity: Not on file  Stress: Not on file  Social Connections: Unknown (11/07/2021)   [facility]   Social Network    Social Network: Not on file  Intimate Partner Violence: Not At Risk (04/21/2023)   Humiliation, Afraid, Rape, and Kick questionnaire    Fear of Current or Ex-Partner: No    Emotionally Abused: No    Physically Abused: No    Sexually Abused: No    Past Medical History, Surgical history, Social history, and Family history were reviewed and updated as appropriate.   Please see review of systems for further details on the patient's review from today.   Objective:   Physical Exam:  There were no vitals taken for this visit.  Physical Exam Constitutional:      General: He is not in acute distress.    Appearance: He is well-developed.  Musculoskeletal:        General: No deformity.  Neurological:     Mental Status: He is alert and oriented to person, place, and time.     Motor: No tremor.     Coordination: Coordination normal.     Gait: Gait normal.  Psychiatric:        Attention and Perception: Attention and perception normal.        Mood  and Affect: Mood is not anxious or depressed. Affect is not blunt or inappropriate.        Speech: Speech normal.        Behavior: Behavior normal.        Thought Content: Thought content normal. Thought content is not delusional. Thought content does not include homicidal or suicidal ideation. Thought content does not include suicidal plan.        Cognition and Memory: Cognition normal.  Judgment: Judgment normal.     Comments: Insight intact. No auditory or visual hallucinations. No delusions.  Depression resolved and upbeat     Lab Review:     Component Value Date/Time   NA 139 04/21/2023 0251   NA 143 06/22/2022 0849   K 4.5 04/21/2023 0251   CL 105 04/21/2023 0251   CO2 26 04/21/2023 0251   GLUCOSE 133 (H) 04/21/2023 0251   BUN 22 04/21/2023 0251   BUN 19 06/22/2022 0849   CREATININE 1.25 (H) 04/21/2023 0251   CREATININE 1.18 (H) 04/18/2018 0937   CALCIUM 9.4 04/21/2023 0251   PROT 6.9 04/20/2023 1606   ALBUMIN 4.1 04/20/2023 1606   AST 19 04/20/2023 1606   ALT 20 04/20/2023 1606   ALKPHOS 55 04/20/2023 1606   BILITOT 0.7 04/20/2023 1606   GFRNONAA 56 (L) 04/21/2023 0251   GFRAA 68 03/25/2019 1101       Component Value Date/Time   WBC 12.7 (H) 04/21/2023 0251   RBC 4.83 04/21/2023 0251   HGB 14.5 04/21/2023 0251   HCT 45.6 04/21/2023 0251   PLT 340 04/21/2023 0251   MCV 94.4 04/21/2023 0251   MCH 30.0 04/21/2023 0251   MCHC 31.8 04/21/2023 0251   RDW 13.5 04/21/2023 0251   LYMPHSABS 2.0 04/20/2023 1606   MONOABS 0.9 04/20/2023 1606   EOSABS 0.1 04/20/2023 1606   BASOSABS 0.1 04/20/2023 1606    Lithium Lvl  Date Value Ref Range Status  04/21/2023 0.55 (L) 0.60 - 1.20 mmol/L Final    Comment:    Performed at [facility], [address] [place], [place] [address]  12/21/22 lithium 1.0 on 600 mg nightly & level 53 on 75 mg HS  06/22/22 nortrip level 32 06/22/22 lithium level 0.6 on 600 mg daily.  11/30/20 lab and TC ;His nortriptyline level  is 303 and should be 150 or lower.  He is on 2 of the 50 mg capsules.  05/03/2021 lithium level 0.8 on 450 mg nightly  Normal B12 in March 2023  No results found for: "PHENYTOIN", "PHENOBARB", "VALPROATE", "CBMZ"   .res Assessment: Plan:    No diagnosis found.   30 min face to face time with patient was spent on counseling and coordination of care.    March 2023 relapse of depression.  It was discovered by blood levels as well as by history that there was some inconsistency with compliance with both nortriptyline and lithium.  Lithium dosage has been increased and nortriptyline level has been increased. 06/22/22 nortrip level 32 06/22/22 lithium level 0.6 on 600 mg daily. Was Much better, depression resolved, with lithium and nortriptyline after compliant again.  June 2024 relapse again. 11/2022 nortrip level 53 and lithium level 1.0   Seeing Dr. Arbutus Leas for tremor.  Hx equivocal DAT scan but also likely lithium tremor.He is addressing these issues. But with less lithium the tremor is better and manageable.   Reviewed most recent note indicating [facility].  Also recent TIA  The patient and Dr. Arbutus Leas are both aware that there can be some neurological including cognitive side effects from lithium and nortriptyline.  However he has relapsed with reductions in dose of either medication.  His cognition appears stable in the office so we were not going to change the medicine.  The risk of med change is significant as he has required ECT in the past for TRD  Discussed side effects of both these medications. Counseled patient regarding potential benefits, risks, and side effects  of lithium to include potential risk of lithium affecting thyroid and renal function.  Discussed need for periodic lab monitoring to determine drug level and to assess for potential adverse effects.  Counseled patient regarding signs and symptoms of lithium toxicity and advised that they notify office immediately or seek urgent medical  attention if experiencing these signs and symptoms.  Patient advised to contact office with any questions or concerns.  Better with Increase nortriptyline to 100 mg nightly  (4 of the 25 mg capsules or 2 of the 50 mg capsules nightly) Less tremor with with less lithium to 3 of the 150 mg capsules .  Minimal tremor  Tremor manageable.  Sleep asked for refill lorazepam.  0.5 mg used regularly per PDMP but he says prn  Sleep hygiene disc .  Some napping ok if limits.    B12 level and need for supplements.  He had stopped. History low B12.  Was OK.  Continues gym and church  Follow-up 4 mos  Meredith Staggers MD, DFAPA  Please see After Visit Summary for patient specific instructions.  Future Appointments  Date Time Provider Department Center  07/26/2023  9:00 AM [facility]  07/26/2023  9:40 AM Victorino Sparrow, MD [facility]  01/03/2024  2:30 PM Tat, Octaviano Batty, DO [facility] None    No orders of the defined types were placed in this encounter.     -------------------------------

## 2023-07-19 DIAGNOSIS — M1611 Unilateral primary osteoarthritis, right hip: Secondary | ICD-10-CM | POA: Diagnosis not present

## 2023-07-19 DIAGNOSIS — M25551 Pain in right hip: Secondary | ICD-10-CM | POA: Diagnosis not present

## 2023-07-23 NOTE — Progress Notes (Unsigned)
Office Note     HPI: Chase Mathis is a 89 y.o. ([birth year]) male presenting for hospital follow-up after recent neurologic episode.   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  He has struggled with depression, and is currently treated with lithium which she has had issues within the past.  Per his son, when lithium levels are low, Chase Mathis goes into a state where he is unable to walk, unable to move.  This has happened 5 times.  He presented to the hospital last week with an episode similar, however slurred speech was also appreciated.  He had a stroke workup demonstrating 80% stenosis of the right internal carotid artery with concern for symptomatic ICA disease.  Head imaging was negative.  Vascular surgery was called for further recommendations.  I had a long conversation with him in the hospital regarding his symptoms, and imaging.  We elected to discuss things further in the outpatient setting. Exam today, Chase Mathis was doing well, accompanied by his wife and son.  He has had no further issues.  Past Medical History:  Diagnosis Date   Benign non-nodular prostatic hyperplasia with lower urinary tract symptoms 12/21/2014   Chronic sinusitis 08/23/2017   Combined forms of age-related cataract of both eyes 06/15/2016   Elevated prostate specific antigen (PSA) 07/03/2011   Epiretinal membrane, bilateral 06/03/2015   Epistaxis 08/16/2017   History of electroconvulsive therapy    Hypertension    Left ureteral stone 01/28/2015   Lithium-induced tremor    Major depressive disorder in remission    MCI (mild cognitive impairment) with memory loss 01/05/2021   Peripheral neuropathy    Recurrent nephrolithiasis 09/02/2017   Vitreous syneresis of both eyes 06/03/2015    Past Surgical History:  Procedure Laterality Date   CYSTOSCOPY WITH RETROGRADE PYELOGRAM, URETEROSCOPY AND STENT PLACEMENT Left 01/28/2015   Procedure: CYSTOSCOPY WITH LEFT  RETROGRADE PYELOGRAM, LEFT URETEROSCOPY/STONE BASKETING  AND LEFT URETERAL  STENT PLACEMENT;  Surgeon: Jethro Bolus, MD;  Location: [facility];  Service: Urology;  Laterality: Left;   SINUS IRRIGATION      Social History   Socioeconomic History   Marital status: Married    Spouse name: Not on file   Number of children: Not on file   Years of education: 20   Highest education level: Professional school degree (e.g., MD, DDS, DVM, JD)  Occupational History   Occupation: retired    Comment: urologist  Tobacco Use   Smoking status: Never   Smokeless tobacco: Never  Substance and Sexual Activity   Alcohol use: Yes    Comment: very rare   Drug use: No   Sexual activity: Yes  Other Topics Concern   Not on file  Social History Narrative   Right handed   Drinks caffeine    Two story home   Social Drivers of Health   Financial Resource Strain: Not on file  Food Insecurity: No Food Insecurity (04/21/2023)   Hunger Vital Sign    Worried About Running Out of Food in the Last Year: Never true    Ran Out of Food in the Last Year: Never true  Transportation Needs: No Transportation Needs (04/21/2023)   PRAPARE - Administrator, Civil Service (Medical): No    Lack of Transportation (Non-Medical): No  Physical Activity: Not on file  Stress: Not on file  Social Connections: Unknown (11/07/2021)   Received from [facility], [facility]   Social Network  Social Network: Not on file  Intimate Partner Violence: Not At Risk (04/21/2023)   Humiliation, Afraid, Rape, and Kick questionnaire    Fear of Current or Ex-Partner: No    Emotionally Abused: No    Physically Abused: No    Sexually Abused: No   Family History  Problem Relation Age of Onset   Lung cancer Mother    Heart disease Father    Pancreatic cancer Sister    Healthy Son    Healthy Daughter     Current Outpatient Medications  Medication Sig Dispense Refill   amLODipine (NORVASC) 5 MG tablet Take 5  mg by mouth daily.     aspirin 81 MG chewable tablet Chew 1 tablet (81 mg total) by mouth daily.     chlorhexidine (PERIDEX) 0.12 % solution Use as directed 15 mLs in the mouth or throat 2 (two) times daily.     clopidogrel (PLAVIX) 75 MG tablet Take 1 tablet (75 mg total) by mouth daily. 21 tablet 0   finasteride (PROSCAR) 5 MG tablet Take 1 tablet (5 mg total) by mouth daily. 90 tablet 3   lisinopril (PRINIVIL,ZESTRIL) 10 MG tablet Take 10 mg by mouth daily.     lithium carbonate 150 MG capsule TAKE 3 CAPSULES (450 MG TOTAL) BY MOUTH AT BEDTIME. 270 capsule 0   LORazepam (ATIVAN) 0.5 MG tablet TAKE 1 TABLET BY MOUTH AT BEDTIME AS NEEDED FOR ANXIETY. 30 tablet 2   nortriptyline (PAMELOR) 50 MG capsule Take 2 capsules (100 mg total) by mouth at bedtime. 180 capsule 0   pyridoxine (B-6) 250 MG tablet Take 250 mg by mouth 2 (two) times daily.     rosuvastatin (CRESTOR) 20 MG tablet Take 1 tablet (20 mg total) by mouth at bedtime. 30 tablet 3   No current facility-administered medications for this visit.    No Known Allergies   REVIEW OF SYSTEMS:  [X]  denotes positive finding, [ ]  denotes negative finding Cardiac  Comments:  Chest pain or chest pressure:    Shortness of breath upon exertion:    Short of breath when lying flat:    Irregular heart rhythm:        Vascular    Pain in calf, thigh, or hip brought on by ambulation:    Pain in feet at night that wakes you up from your sleep:     Blood clot in your veins:    Leg swelling:         Pulmonary    Oxygen at home:    Productive cough:     Wheezing:         Neurologic    Sudden weakness in arms or legs:     Sudden numbness in arms or legs:     Sudden onset of difficulty speaking or slurred speech:    Temporary loss of vision in one eye:     Problems with dizziness:         Gastrointestinal    Blood in stool:     Vomited blood:         Genitourinary    Burning when urinating:     Blood in urine:        Psychiatric     Major depression:         Hematologic    Bleeding problems:    Problems with blood clotting too easily:        Skin    Rashes or ulcers:  Constitutional    Fever or chills:      PHYSICAL EXAMINATION:  There were no vitals filed for this visit.   General:  WDWN in NAD; vital signs documented above Gait: Not observed HENT: WNL, normocephalic Pulmonary: normal non-labored breathing , without wheezing Cardiac: regular HR Abdomen: soft, NT, no masses Skin: without rashes Vascular Exam/Pulses:  Right Left  Radial 2+ (normal) 2+ (normal)                       Extremities: without ischemic changes, without Gangrene , without cellulitis; without open wounds;  Musculoskeletal: no muscle wasting or atrophy  Neurologic: A&O X 3;  No focal weakness or paresthesias are detected Psychiatric:  The pt has Normal affect.   Non-Invasive Vascular Imaging:   CT imaging demonstrating 80% stenosis  Duplex ultrasound demonstrating less than 80% stenosis    ASSESSMENT/PLAN: Chase Mathis is a 89 y.o. male presenting status post neurologic event.  I had a long conversation with him and his family in which we revisited the symptoms that led to his hospitalization.  None of his symptoms were consistent with stroke except for slurred speech.  No lesions on MRI.  While I cannot rule out TIA, symptoms are consistent with episodes he has had previously with low lithium levels per his son.  Imaging was reviewed, and the CT appears to over-call stenosis as shadowing from his prior dental procedures impacts opacification of the ICA.  This can be appreciated in the coronal and sagittal views.  Carotid duplex ultrasound demonstrates stenosis less than 80%. I feel comfortable calling his right sided ICA stenosis asymptomatic at this time.  With less than 80% stenosis, operative intervention is not indicated.  My plan is to follow him with carotid duplex ultrasounds to ensure he does not have  progression.  We revisited the signs and symptoms of stroke, TIA, amaurosis.  He was asked to call 911 should any of these occur.   Chase Sparrow, MD [facility] [phone]. Total time of patient care including pre-visit research, consultation, and documentation greater than 40 minutes

## 2023-07-26 ENCOUNTER — Ambulatory Visit: Payer: Medicare Other | Admitting: Vascular Surgery

## 2023-07-26 ENCOUNTER — Encounter: Payer: Self-pay | Admitting: Vascular Surgery

## 2023-07-26 ENCOUNTER — Ambulatory Visit (HOSPITAL_COMMUNITY)
Admission: RE | Admit: 2023-07-26 | Discharge: 2023-07-26 | Disposition: A | Discharge status: Home / Self Care | Payer: Medicare Other | Source: Ambulatory Visit | Attending: Vascular Surgery | Admitting: Vascular Surgery

## 2023-07-26 VITALS — BP 157/77 | HR 67 | Temp 98.3°F | Resp 20 | Ht 68.0 in | Wt 209.0 lb

## 2023-07-26 DIAGNOSIS — I6521 Occlusion and stenosis of right carotid artery: Secondary | ICD-10-CM

## 2023-07-31 ENCOUNTER — Other Ambulatory Visit: Payer: Self-pay | Admitting: Orthopedic Surgery

## 2023-07-31 NOTE — Care Plan (Signed)
 Ortho Bundle Case Management Note  Patient Details  Name: DYKE WEIBLE MRN: [id number] Date of Birth: 1933/10/01  met with patient and daughter in the office. will return to Independent living at [facility]. has DME. will have all therapy at [facility]. discharge instructions discussed at visit and questions answered. discharge instructions and orders sent to Amber at [facility]. Patient and MD in agreement with plan. Choice offered                     DME Arranged:  [facility] rolling DME Agency:  [employer]  HH Arranged:    HH Agency:     Additional Comments: Please contact me with any questions of if this plan should need to change.  Charlies Pitch,  RN,BSN,MHA,CCM  [facility]  [phone] 07/31/2023, 12:25 PM

## 2023-07-31 NOTE — Progress Notes (Addendum)
 COVID Vaccine Completed:  Yes  Date of COVID positive in last 90 days:  No  PCP - Dr. Dwight Cardiologist - N/A Vascular - Fonda Rim, MD  Chest x-ray - 1V 04-21-23 Epic EKG - 04-23-23 Epic Stress Test - N/A ECHO - 04-21-23 Epic Cardiac Cath - N/A Pacemaker/ICD device last checked: Spinal Cord Stimulator:N/A  Bowel Prep - N/A  Sleep Study - N/A CPAP -   Fasting Blood Sugar - N/A Checks Blood Sugar _____ times a day  Last dose of GLP1 agonist-  N/A GLP1 instructions:  Hold 7 days before surgery    Last dose of SGLT-2 inhibitors-  N/A SGLT-2 instructions:  Hold 3 days before surgery    Blood Thinner Instructions:  Plavix , hold x5 days per vascular.  Last dose 07-28-23 Aspirin  Instructions:  ASA 81 Last Dose: 07-28-23  Activity level:  Can go up a flight of stairs and perform activities of daily living without stopping and without symptoms of chest pain or shortness of breath.  Anesthesia review:  Bilateral ICA stenosis  Patient denies shortness of breath, fever, cough and chest pain at PAT appointment  Patient verbalized understanding of instructions that were given to them at the PAT appointment. Patient was also instructed that they will need to review over the PAT instructions again at home before surgery.

## 2023-07-31 NOTE — Patient Instructions (Addendum)
 SURGICAL WAITING ROOM VISITATION Patients having surgery or a procedure may have no more than 2 support people in the waiting area - these visitors may rotate.    Children under the age of 80 must have an adult with them who is not the patient.  Due to an increase in RSV and influenza rates and associated hospitalizations, children ages 50 and under may not visit patients in [facility] hospitals.   If the patient needs to stay at the hospital during part of their recovery, the visitor guidelines for inpatient rooms apply. Pre-op nurse will coordinate an appropriate time for 1 support person to accompany patient in pre-op.  This support person may not rotate.    Please refer to the [facility] website for the visitor guidelines for Inpatients (after your surgery is over and you are in a regular room).       Your procedure is scheduled on: 08-03-23   Report to [facility] Main Entrance    Report to admitting at 9:30 AM   Call this number if you have problems the morning of surgery [phone]   Do not eat food :After Midnight.   After Midnight you may have the following liquids until 9:00 AM DAY OF SURGERY  Water Non-Citrus Juices (without pulp, NO RED-Apple, White grape, White cranberry) Black Coffee (NO MILK/CREAM OR CREAMERS, sugar ok)  Clear Tea (NO MILK/CREAM OR CREAMERS, sugar ok) regular and decaf                             Plain Jell-O (NO RED)                                           Fruit ices (not with fruit pulp, NO RED)                                     Popsicles (NO RED)                                                               Sports drinks like Gatorade (NO RED)                   The day of surgery:  Drink ONE (1) Pre-Surgery Clear Ensure  by 9:00 AM the morning of surgery. Drink in one sitting. Do not sip.  This drink was given to you during your hospital  pre-op appointment visit. Nothing else to drink after completing the Pre-Surgery Clear  Ensure.          If you have questions, please contact your surgeon's office.   FOLLOW ANY ADDITIONAL PRE OP INSTRUCTIONS YOU RECEIVED FROM YOUR SURGEON'S OFFICE!!!     Oral Hygiene is also important to reduce your risk of infection.                                    Remember - BRUSH YOUR TEETH THE MORNING OF SURGERY WITH YOUR REGULAR TOOTHPASTE  Do NOT smoke after Midnight   Take these medicines the morning of surgery with A SIP OF WATER:   Amlodipine   Finasteride   Stop all vitamins and herbal supplements 7 days before surgery                              You may not have any metal on your body including jewelry, and body piercing             Do not wear lotions, powders,  cologne, or deodorant              Men may shave face and neck.   Do not bring valuables to the hospital. [facility] IS NOT RESPONSIBLE   FOR VALUABLES.   Contacts, dentures or bridgework may not be worn into surgery.   Bring small overnight bag day of surgery.   DO NOT BRING YOUR HOME MEDICATIONS TO THE HOSPITAL. PHARMACY WILL DISPENSE MEDICATIONS LISTED ON YOUR MEDICATION LIST TO YOU DURING YOUR ADMISSION IN THE HOSPITAL!   Special Instructions: Bring a copy of your healthcare power of attorney and living will documents the day of surgery if you haven't scanned them before.              Please read over the following fact sheets you were given: IF YOU HAVE QUESTIONS ABOUT YOUR PRE-OP INSTRUCTIONS PLEASE CALL [phone] [facility]  If you received a COVID test during your pre-op visit  it is requested that you wear a mask when out in public, stay away from anyone that may not be feeling well and notify your surgeon if you develop symptoms. If you test positive for Covid or have been in contact with anyone that has tested positive in the last 10 days please notify you surgeon.  [facility] - Preparing for Surgery Before surgery, you can play an important role.  Because skin is not sterile, your skin needs  to be as free of germs as possible.  You can reduce the number of germs on your skin by washing with CHG (chlorahexidine gluconate) soap before surgery.  CHG is an antiseptic cleaner which kills germs and bonds with the skin to continue killing germs even after washing. Please DO NOT use if you have an allergy to CHG or antibacterial soaps.  If your skin becomes reddened/irritated stop using the CHG and inform your nurse when you arrive at Short Stay. Do not shave (including legs and underarms) for at least 48 hours prior to the first CHG shower.  You may shave your face/neck.  Please follow these instructions carefully:  1.  Shower with CHG Soap the night before surgery and the  morning of surgery.  2.  If you choose to wash your hair, wash your hair first as usual with your normal  shampoo.  3.  After you shampoo, rinse your hair and body thoroughly to remove the shampoo.                             4.  Use CHG as you would any other liquid soap.  You can apply chg directly to the skin and wash.  Gently with a scrungie or clean washcloth.  5.  Apply the CHG Soap to your body ONLY FROM THE NECK DOWN.   Do   not use on face/ open  Wound or open sores. Avoid contact with eyes, ears mouth and   genitals (private parts).                       Wash face,  Genitals (private parts) with your normal soap.             6.  Wash thoroughly, paying special attention to the area where your    surgery  will be performed.  7.  Thoroughly rinse your body with warm water from the neck down.  8.  DO NOT shower/wash with your normal soap after using and rinsing off the CHG Soap.                9.  Pat yourself dry with a clean towel.            10.  Wear clean pajamas.            11.  Place clean sheets on your bed the night of your first shower and do not  sleep with pets. Day of Surgery : Do not apply any lotions/deodorants the morning of surgery.  Please wear clean clothes to the  hospital/surgery center.  FAILURE TO FOLLOW THESE INSTRUCTIONS MAY RESULT IN THE CANCELLATION OF YOUR SURGERY  PATIENT SIGNATURE_________________________________  NURSE SIGNATURE__________________________________  ________________________________________________________________________    Chase Mathis  An incentive spirometer is a tool that can help keep your lungs clear and active. This tool measures how well you are filling your lungs with each breath. Taking long deep breaths may help reverse or decrease the chance of developing breathing (pulmonary) problems (especially infection) following: A long period of time when you are unable to move or be active. BEFORE THE PROCEDURE  If the spirometer includes an indicator to show your best effort, your nurse or respiratory therapist will set it to a desired goal. If possible, sit up straight or lean slightly forward. Try not to slouch. Hold the incentive spirometer in an upright position. INSTRUCTIONS FOR USE  Sit on the edge of your bed if possible, or sit up as far as you can in bed or on a chair. Hold the incentive spirometer in an upright position. Breathe out normally. Place the mouthpiece in your mouth and seal your lips tightly around it. Breathe in slowly and as deeply as possible, raising the piston or the ball toward the top of the column. Hold your breath for 3-5 seconds or for as long as possible. Allow the piston or ball to fall to the bottom of the column. Remove the mouthpiece from your mouth and breathe out normally. Rest for a few seconds and repeat Steps 1 through 7 at least 10 times every 1-2 hours when you are awake. Take your time and take a few normal breaths between deep breaths. The spirometer may include an indicator to show your best effort. Use the indicator as a goal to work toward during each repetition. After each set of 10 deep breaths, practice coughing to be sure your lungs are clear. If you have an  incision (the cut made at the time of surgery), support your incision when coughing by placing a pillow or rolled up towels firmly against it. Once you are able to get out of bed, walk around indoors and cough well. You may stop using the incentive spirometer when instructed by your caregiver.  RISKS AND COMPLICATIONS Take your time so you do not get dizzy or light-headed. If you are in pain,  you may need to take or ask for pain medication before doing incentive spirometry. It is harder to take a deep breath if you are having pain. AFTER USE Rest and breathe slowly and easily. It can be helpful to keep track of a log of your progress. Your caregiver can provide you with a simple table to help with this. If you are using the spirometer at home, follow these instructions: SEEK MEDICAL CARE IF:  You are having difficultly using the spirometer. You have trouble using the spirometer as often as instructed. Your pain medication is not giving enough relief while using the spirometer. You develop fever of 100.5 F (38.1 C) or higher. SEEK IMMEDIATE MEDICAL CARE IF:  You cough up bloody sputum that had not been present before. You develop fever of 102 F (38.9 C) or greater. You develop worsening pain at or near the incision site. MAKE SURE YOU:  Understand these instructions. Will watch your condition. Will get help right away if you are not doing well or get worse. Document Released: 10/23/2006 Document Revised: 09/04/2011 Document Reviewed: 12/24/2006 ExitCare Patient Information 2014 ExitCare, [place].   ________________________________________________________________________ WHAT IS A BLOOD TRANSFUSION? Blood Transfusion Information  A transfusion is the replacement of blood or some of its parts. Blood is made up of multiple cells which provide different functions. Red blood cells carry oxygen and are used for blood loss replacement. White blood cells fight against infection. Platelets  control bleeding. Plasma helps clot blood. Other blood products are available for specialized needs, such as hemophilia or other clotting disorders. BEFORE THE TRANSFUSION  Who gives blood for transfusions?  Healthy volunteers who are fully evaluated to make sure their blood is safe. This is blood bank blood. Transfusion therapy is the safest it has ever been in the practice of medicine. Before blood is taken from a donor, a complete history is taken to make sure that person has no history of diseases nor engages in risky social behavior (examples are intravenous drug use or sexual activity with multiple partners). The donor's travel history is screened to minimize risk of transmitting infections, such as malaria. The donated blood is tested for signs of infectious diseases, such as HIV and hepatitis. The blood is then tested to be sure it is compatible with you in order to minimize the chance of a transfusion reaction. If you or a relative donates blood, this is often done in anticipation of surgery and is not appropriate for emergency situations. It takes many days to process the donated blood. RISKS AND COMPLICATIONS Although transfusion therapy is very safe and saves many lives, the main dangers of transfusion include:  Getting an infectious disease. Developing a transfusion reaction. This is an allergic reaction to something in the blood you were given. Every precaution is taken to prevent this. The decision to have a blood transfusion has been considered carefully by your caregiver before blood is given. Blood is not given unless the benefits outweigh the risks. AFTER THE TRANSFUSION Right after receiving a blood transfusion, you will usually feel much better and more energetic. This is especially true if your red blood cells have gotten low (anemic). The transfusion raises the level of the red blood cells which carry oxygen, and this usually causes an energy increase. The nurse administering the  transfusion will monitor you carefully for complications. HOME CARE INSTRUCTIONS  No special instructions are needed after a transfusion. You may find your energy is better. Speak with your caregiver about any limitations on  activity for underlying diseases you may have. SEEK MEDICAL CARE IF:  Your condition is not improving after your transfusion. You develop redness or irritation at the intravenous (IV) site. SEEK IMMEDIATE MEDICAL CARE IF:  Any of the following symptoms occur over the next 12 hours: Shaking chills. You have a temperature by mouth above 102 F (38.9 C), not controlled by medicine. Chest, back, or muscle pain. People around you feel you are not acting correctly or are confused. Shortness of breath or difficulty breathing. Dizziness and fainting. You get a rash or develop hives. You have a decrease in urine output. Your urine turns a dark color or changes to pink, red, or brown. Any of the following symptoms occur over the next 10 days: You have a temperature by mouth above 102 F (38.9 C), not controlled by medicine. Shortness of breath. Weakness after normal activity. The white part of the eye turns yellow (jaundice). You have a decrease in the amount of urine or are urinating less often. Your urine turns a dark color or changes to pink, red, or brown. Document Released: 06/09/2000 Document Revised: 09/04/2011 Document Reviewed: 01/27/2008 [facility] Patient Information 2014 [place], [place].  _______________________________________________________________________

## 2023-08-01 DIAGNOSIS — M1611 Unilateral primary osteoarthritis, right hip: Secondary | ICD-10-CM | POA: Diagnosis present

## 2023-08-01 NOTE — H&P (Signed)
 TOTAL HIP ADMISSION H&P  Patient is admitted for right total hip arthroplasty.  Subjective:  Chief Complaint: right hip pain  HPI: Chase Mathis, 89 y.o. male, has a history of pain and functional disability in the right hip(s) due to arthritis and patient has failed non-surgical conservative treatments for greater than 12 weeks to include NSAID's and/or analgesics, flexibility and strengthening excercises, use of assistive devices, and activity modification.  Onset of symptoms was gradual starting 1 years ago with gradually worsening course since that time.The patient noted no past surgery on the right hip(s).  Patient currently rates pain in the right hip at 10 out of 10 with activity. Patient has night pain, worsening of pain with activity and weight bearing, trendelenberg gait, pain that interfers with activities of daily living, and pain with passive range of motion. Patient has evidence of periarticular osteophytes and joint space narrowing by imaging studies. This condition presents safety issues increasing the risk of falls.  There is no current active infection.  Patient Active Problem List   Diagnosis Date Noted   Osteoarthritis of right hip 08/01/2023   Internal carotid artery stenosis, right 04/21/2023   TIA (transient ischemic attack) 04/20/2023   Pure hypercholesterolemia 02/14/2022   Stage 3a chronic kidney disease (HCC) 02/14/2022   MCI (mild cognitive impairment) with memory loss 01/05/2021   Hypertension 01/04/2021   History of electroconvulsive therapy    Lithium -induced tremor    Peripheral neuropathy    Major depressive disorder in remission    Recurrent nephrolithiasis 09/02/2017   Chronic sinusitis 08/23/2017   Epistaxis 08/16/2017   Combined forms of age-related cataract of both eyes 06/15/2016   Epiretinal membrane, bilateral 06/03/2015   Vitreous syneresis of both eyes 06/03/2015   Benign non-nodular prostatic hyperplasia with lower urinary tract symptoms  12/21/2014   Nuclear cataract 01/19/2012   ED (erectile dysfunction) of organic origin 07/03/2011   Elevated prostate specific antigen (PSA) 07/03/2011   Past Medical History:  Diagnosis Date   Benign non-nodular prostatic hyperplasia with lower urinary tract symptoms 12/21/2014   Chronic sinusitis 08/23/2017   Combined forms of age-related cataract of both eyes 06/15/2016   Elevated prostate specific antigen (PSA) 07/03/2011   Epiretinal membrane, bilateral 06/03/2015   Epistaxis 08/16/2017   History of electroconvulsive therapy    Hypertension    Left ureteral stone 01/28/2015   Lithium -induced tremor    Major depressive disorder in remission    MCI (mild cognitive impairment) with memory loss 01/05/2021   Peripheral neuropathy    Recurrent nephrolithiasis 09/02/2017   Vitreous syneresis of both eyes 06/03/2015    Past Surgical History:  Procedure Laterality Date   CYSTOSCOPY WITH RETROGRADE PYELOGRAM, URETEROSCOPY AND STENT PLACEMENT Left 01/28/2015   Procedure: CYSTOSCOPY WITH LEFT RETROGRADE PYELOGRAM, LEFT URETEROSCOPY/STONE BASKETING  AND LEFT URETERAL  STENT PLACEMENT;  Surgeon: Arlena Gal, MD;  Location: [facility];  Service: Urology;  Laterality: Left;   SINUS IRRIGATION      No current facility-administered medications for this encounter.   Current Outpatient Medications  Medication Sig Dispense Refill Last Dose/Taking   amLODipine  (NORVASC ) 5 MG tablet Take 5 mg by mouth daily.   Taking   finasteride  (PROSCAR ) 5 MG tablet Take 1 tablet (5 mg total) by mouth daily. 90 tablet 3 Taking   lisinopril  (PRINIVIL ,ZESTRIL ) 10 MG tablet Take 10 mg by mouth daily.   Taking   lithium  carbonate 150 MG capsule TAKE 3 CAPSULES (450 MG TOTAL) BY MOUTH AT BEDTIME. 270 capsule 0 Taking  LORazepam  (ATIVAN ) 0.5 MG tablet TAKE 1 TABLET BY MOUTH AT BEDTIME AS NEEDED FOR ANXIETY. 30 tablet 2 Taking   nortriptyline  (PAMELOR ) 50 MG capsule Take 2 capsules (100 mg total) by mouth at  bedtime. 180 capsule 0 Taking   aspirin  81 MG chewable tablet Chew 1 tablet (81 mg total) by mouth daily.      clopidogrel  (PLAVIX ) 75 MG tablet Take 1 tablet (75 mg total) by mouth daily. 21 tablet 0    rosuvastatin  (CRESTOR ) 20 MG tablet Take 1 tablet (20 mg total) by mouth at bedtime. (Patient not taking: Reported on 08/01/2023) 30 tablet 3 Not Taking   No Known Allergies  Social History   Tobacco Use   Smoking status: Never   Smokeless tobacco: Never  Substance Use Topics   Alcohol use: Yes    Comment: very rare    Family History  Problem Relation Age of Onset   Lung cancer Mother    Heart disease Father    Pancreatic cancer Sister    Healthy Son    Healthy Daughter      Review of Systems  Constitutional: Negative.   HENT:         Nose bleeds  Eyes: Negative.   Respiratory: Negative.    Cardiovascular:        HTN  Gastrointestinal: Negative.   Endocrine: Negative.   Genitourinary: Negative.        Enlarged prostate  Musculoskeletal:  Positive for arthralgias and myalgias.  Allergic/Immunologic: Negative.   Neurological: Negative.   Hematological: Negative.   Psychiatric/Behavioral: Negative.      Objective:  Physical Exam Constitutional:      Appearance: Normal appearance. He is normal weight.  HENT:     Head: Normocephalic and atraumatic.     Nose: Nose normal.  Eyes:     Pupils: Pupils are equal, round, and reactive to light.  Cardiovascular:     Pulses: Normal pulses.  Pulmonary:     Effort: Pulmonary effort is normal.  Musculoskeletal:     Cervical back: Normal range of motion and neck supple.     Comments: Patient continues to have significant irritability with attempts of internal rotation of the hip.  Calves are soft and nontender.  Skin:    General: Skin is warm and dry.  Neurological:     General: No focal deficit present.     Mental Status: He is alert and oriented to person, place, and time. Mental status is at baseline.  Psychiatric:         Mood and Affect: Mood normal.        Behavior: Behavior normal.        Thought Content: Thought content normal.        Judgment: Judgment normal.     Vital signs in last 24 hours:    Labs:   Estimated body mass index is 31.78 kg/m as calculated from the following:   Height as of 07/26/23: 5' 8 (1.727 m).   Weight as of 07/26/23: 94.8 kg.   Imaging Review Plain radiographs demonstrate  AP pelvis and crosstable lateral of both hips shows bone-on-bone arthritis to the right hip 5 mm lateral subluxation peripheral osteophytes subchondral sclerosis in the acetabulum and femoral head.  Contralateral left hip is normal for age.  Normal articular cartilage layer.  No subluxation.   Assessment/Plan:  End stage arthritis, right hip(s)  The patient history, physical examination, clinical judgement of the provider and imaging studies are consistent with end  stage degenerative joint disease of the right hip(s) and total hip arthroplasty is deemed medically necessary. The treatment options including medical management, injection therapy, arthroscopy and arthroplasty were discussed at length. The risks and benefits of total hip arthroplasty were presented and reviewed. The risks due to aseptic loosening, infection, stiffness, dislocation/subluxation,  thromboembolic complications and other imponderables were discussed.  The patient acknowledged the explanation, agreed to proceed with the plan and consent was signed. Patient is being admitted for inpatient treatment for surgery, pain control, PT, OT, prophylactic antibiotics, VTE prophylaxis, progressive ambulation and ADL's and discharge planning.The patient is planning to be discharged home with home health services

## 2023-08-02 ENCOUNTER — Encounter (HOSPITAL_COMMUNITY)
Admission: RE | Admit: 2023-08-02 | Discharge: 2023-08-02 | Disposition: A | Discharge status: Home / Self Care | Payer: Medicare Other | Source: Ambulatory Visit | Attending: Orthopedic Surgery | Admitting: Orthopedic Surgery

## 2023-08-02 ENCOUNTER — Other Ambulatory Visit: Payer: Self-pay

## 2023-08-02 ENCOUNTER — Other Ambulatory Visit: Payer: Self-pay | Admitting: Psychiatry

## 2023-08-02 ENCOUNTER — Encounter (HOSPITAL_COMMUNITY): Payer: Self-pay

## 2023-08-02 VITALS — BP 137/71 | HR 63 | Temp 98.2°F | Resp 16 | Ht 68.0 in | Wt 180.0 lb

## 2023-08-02 DIAGNOSIS — Z01818 Encounter for other preprocedural examination: Secondary | ICD-10-CM | POA: Diagnosis present

## 2023-08-02 DIAGNOSIS — I10 Essential (primary) hypertension: Secondary | ICD-10-CM | POA: Diagnosis not present

## 2023-08-02 DIAGNOSIS — Z8673 Personal history of transient ischemic attack (TIA), and cerebral infarction without residual deficits: Secondary | ICD-10-CM | POA: Diagnosis not present

## 2023-08-02 DIAGNOSIS — M1611 Unilateral primary osteoarthritis, right hip: Secondary | ICD-10-CM | POA: Insufficient documentation

## 2023-08-02 DIAGNOSIS — I6529 Occlusion and stenosis of unspecified carotid artery: Secondary | ICD-10-CM | POA: Diagnosis not present

## 2023-08-02 DIAGNOSIS — Z01812 Encounter for preprocedural laboratory examination: Secondary | ICD-10-CM | POA: Diagnosis not present

## 2023-08-02 DIAGNOSIS — Z7902 Long term (current) use of antithrombotics/antiplatelets: Secondary | ICD-10-CM | POA: Diagnosis not present

## 2023-08-02 DIAGNOSIS — I6521 Occlusion and stenosis of right carotid artery: Secondary | ICD-10-CM

## 2023-08-02 HISTORY — DX: Unspecified osteoarthritis, unspecified site: M19.90

## 2023-08-02 HISTORY — DX: Personal history of urinary calculi: Z87.442

## 2023-08-02 LAB — BASIC METABOLIC PANEL
Anion gap: 9 (ref 5–15)
BUN: 18 mg/dL (ref 8–23)
CO2: 23 mmol/L (ref 22–32)
Calcium: 9.1 mg/dL (ref 8.9–10.3)
Chloride: 104 mmol/L (ref 98–111)
Creatinine, Ser: 1.07 mg/dL (ref 0.61–1.24)
GFR, Estimated: >60 mL/min (ref >60)
Glucose, Bld: 122 mg/dL — ABNORMAL HIGH (ref 70–99)
Potassium: 4.7 mmol/L (ref 3.5–5.1)
Sodium: 136 mmol/L (ref 135–145)

## 2023-08-02 LAB — CBC
HCT: 46.7 % (ref 39.0–52.0)
Hemoglobin: 14.3 g/dL (ref 13.0–17.0)
MCH: 29.1 pg (ref 26.0–34.0)
MCHC: 30.6 g/dL (ref 30.0–36.0)
MCV: 94.9 fL (ref 80.0–100.0)
Platelets: 348 10*3/uL (ref 150–400)
RBC: 4.92 MIL/uL (ref 4.22–5.81)
RDW: 13.3 % (ref 11.5–15.5)
WBC: 9.9 10*3/uL (ref 4.0–10.5)
nRBC: 0 % (ref 0.0–0.2)

## 2023-08-02 LAB — SURGICAL PCR SCREEN
MRSA, PCR: POSITIVE — AB
Staphylococcus aureus: POSITIVE — AB

## 2023-08-02 NOTE — Anesthesia Preprocedure Evaluation (Addendum)
 Anesthesia Evaluation  Patient identified by MRN, date of birth, ID band Patient awake    Reviewed: Allergy & Precautions, NPO status , Patient's Chart, lab work & pertinent test results, reviewed documented beta blocker date and time   Airway Mallampati: II  TM Distance: >3 FB Neck ROM: Full    Dental no notable dental hx.    Pulmonary neg pulmonary ROS   Pulmonary exam normal breath sounds clear to auscultation       Cardiovascular hypertension, Pt. on medications Normal cardiovascular exam Rhythm:Regular Rate:Normal  VAS US  Carotid 07/26/2023 Summary:  Right Carotid: Velocities in the right ICA are consistent with a 40-59% stenosis.  Left Carotid: Velocities in the left ICA are consistent with a 1-39% stenosis.  Vertebrals: Bilateral vertebral arteries demonstrate antegrade flow.  Subclavians: Normal flow hemodynamics were seen in bilateral subclavian arteries.    CV: Echo 04/21/2023 1. Left ventricular ejection fraction, by estimation, is 60 to 65%. The  left ventricle has normal function. The left ventricle has no regional  wall motion abnormalities. Left ventricular diastolic parameters are  consistent with Grade I diastolic  dysfunction (impaired relaxation).   2. Right ventricular systolic function is normal. The right ventricular  size is normal. There is normal pulmonary artery systolic pressure. The  estimated right ventricular systolic pressure is 33.7 mmHg.   3. The mitral valve is grossly normal. No evidence of mitral valve  regurgitation. No evidence of mitral stenosis.   4. The aortic valve was not well visualized. Aortic valve regurgitation  is mild. No aortic stenosis is present.   5. The inferior vena cava is normal in size with greater than 50%  respiratory variability, suggesting right atrial pressure of 3 mmHg.    Neuro/Psych  PSYCHIATRIC DISORDERS  Depression    TIA   GI/Hepatic negative GI ROS, Neg  liver ROS,,,  Endo/Other  negative endocrine ROS    Renal/GU Renal InsufficiencyRenal disease  negative genitourinary   Musculoskeletal  (+) Arthritis ,    Abdominal   Peds  Hematology  (+) Blood dyscrasia (plavix , LD 07/30/23 AM)   Anesthesia Other Findings   Reproductive/Obstetrics                             Anesthesia Physical Anesthesia Plan  ASA: 3  Anesthesia Plan: Spinal   Post-op Pain Management: Ofirmev  IV (intra-op)*   Induction:   PONV Risk Score and Plan: 1 and Treatment may vary due to age or medical condition, Propofol  infusion, Ondansetron  and Dexamethasone   Airway Management Planned: Natural Airway  Additional Equipment:   Intra-op Plan:   Post-operative Plan:   Informed Consent: I have reviewed the patients History and Physical, chart, labs and discussed the procedure including the risks, benefits and alternatives for the proposed anesthesia with the patient or authorized representative who has indicated his/her understanding and acceptance.     Dental advisory given  Plan Discussed with: CRNA  Anesthesia Plan Comments: (See PAT note 08/02/2023)       Anesthesia Quick Evaluation

## 2023-08-02 NOTE — Progress Notes (Signed)
 Anesthesia Chart Review   Case: [id number] Date/Time: 08/03/23 1145   Procedure: RIGHT TOTAL HIP ARTHROPLASTY ANTERIOR APPROACH (Right: Hip)   Anesthesia type: Spinal   Pre-op diagnosis: RIGHT HIP OSTEOARTHRITIS   Location: [facility]OM 08 / [facility]   Surgeons: Liam Lerner, MD       DISCUSSION:89 y.o. never smoker with h/o HTN, TIA, carotid stenosis, right hip OA scheduled for above procedure 08/03/2023 with Dr. Lerner Liam.   Pt s/p TIA 04/19/2024.   Per PCP clearance, Would recommend wait at least 3 months (07/21/23) with moderate risk and will need clearance from vascular sx as pt had carotid/vertebral a stenosis and is following there.  Pt last seen by vascular 07/26/2023. Stable at this visit, recent carotid ultrasound with no progression of stenosis. Pt remains asymptomatic. 1 year follow up recommended. Per OV note, Plavix  can be held 5 days prior to his hip surgery.  VS: BP 137/71   Pulse 63   Temp 36.8 C (Oral)   Resp 16   Ht 5' 8 (1.727 m)   Wt 81.6 kg   SpO2 97%   BMI 27.37 kg/m   PROVIDERS: Dwight Trula SQUIBB, MD is PCP   Lanis Chew, MD is Cardiologist  LABS: Labs reviewed: Acceptable for surgery. (all labs ordered are listed, but only abnormal results are displayed)  Labs Reviewed  BASIC METABOLIC PANEL - Abnormal; Notable for the following components:      Result Value   Glucose, Bld 122 (*)    All other components within normal limits  SURGICAL PCR SCREEN  CBC  TYPE AND SCREEN     IMAGES: VAS US  Carotid 07/26/2023 Summary:  Right Carotid: Velocities in the right ICA are consistent with a 40-59% stenosis.   Left Carotid: Velocities in the left ICA are consistent with a 1-39% stenosis.   Vertebrals: Bilateral vertebral arteries demonstrate antegrade flow.  Subclavians: Normal flow hemodynamics were seen in bilateral subclavian arteries.   EKG:   CV: Echo 04/21/2023 1. Left ventricular ejection fraction, by estimation, is 60 to 65%. The  left  ventricle has normal function. The left ventricle has no regional  wall motion abnormalities. Left ventricular diastolic parameters are  consistent with Grade I diastolic  dysfunction (impaired relaxation).   2. Right ventricular systolic function is normal. The right ventricular  size is normal. There is normal pulmonary artery systolic pressure. The  estimated right ventricular systolic pressure is 33.7 mmHg.   3. The mitral valve is grossly normal. No evidence of mitral valve  regurgitation. No evidence of mitral stenosis.   4. The aortic valve was not well visualized. Aortic valve regurgitation  is mild. No aortic stenosis is present.   5. The inferior vena cava is normal in size with greater than 50%  respiratory variability, suggesting right atrial pressure of 3 mmHg.  Past Medical History:  Diagnosis Date   Arthritis    Benign non-nodular prostatic hyperplasia with lower urinary tract symptoms 12/21/2014   Chronic sinusitis 08/23/2017   Combined forms of age-related cataract of both eyes 06/15/2016   Elevated prostate specific antigen (PSA) 07/03/2011   Epiretinal membrane, bilateral 06/03/2015   Epistaxis 08/16/2017   History of electroconvulsive therapy    History of kidney stones    Hypertension    Left ureteral stone 01/28/2015   Lithium -induced tremor    Major depressive disorder in remission    MCI (mild cognitive impairment) with memory loss 01/05/2021   Peripheral neuropathy    Recurrent nephrolithiasis 09/02/2017  Vitreous syneresis of both eyes 06/03/2015    Past Surgical History:  Procedure Laterality Date   CYSTOSCOPY WITH RETROGRADE PYELOGRAM, URETEROSCOPY AND STENT PLACEMENT Left 01/28/2015   Procedure: CYSTOSCOPY WITH LEFT RETROGRADE PYELOGRAM, LEFT URETEROSCOPY/STONE BASKETING  AND LEFT URETERAL  STENT PLACEMENT;  Surgeon: Arlena Gal, MD;  Location: [facility];  Service: Urology;  Laterality: Left;   SINUS IRRIGATION      MEDICATIONS:  amLODipine   (NORVASC ) 5 MG tablet   aspirin  81 MG chewable tablet   clopidogrel  (PLAVIX ) 75 MG tablet   finasteride  (PROSCAR ) 5 MG tablet   lisinopril  (PRINIVIL ,ZESTRIL ) 10 MG tablet   lithium  carbonate 150 MG capsule   LORazepam  (ATIVAN ) 0.5 MG tablet   nortriptyline  (PAMELOR ) 50 MG capsule   rosuvastatin  (CRESTOR ) 20 MG tablet   No current facility-administered medications for this encounter.    Harlene Hoots Ward, PA-C [facility] [phone]

## 2023-08-02 NOTE — Progress Notes (Signed)
PCR results sent to Dr. Rowan to review. 

## 2023-08-02 NOTE — Telephone Encounter (Signed)
 Lf 1/8 lv 1/20 nv 5/21

## 2023-08-03 ENCOUNTER — Other Ambulatory Visit: Payer: Self-pay

## 2023-08-03 ENCOUNTER — Ambulatory Visit (HOSPITAL_COMMUNITY): Payer: Medicare Other | Admitting: Physician Assistant

## 2023-08-03 ENCOUNTER — Encounter (HOSPITAL_COMMUNITY): Payer: Self-pay | Admitting: Orthopedic Surgery

## 2023-08-03 ENCOUNTER — Ambulatory Visit (HOSPITAL_COMMUNITY): Payer: Medicare Other

## 2023-08-03 ENCOUNTER — Ambulatory Visit (HOSPITAL_COMMUNITY): Payer: Medicare Other | Admitting: Certified Registered"

## 2023-08-03 ENCOUNTER — Encounter (HOSPITAL_COMMUNITY)
Admission: RE | Disposition: A | Discharge status: Home / Self Care | Payer: Self-pay | Source: Home / Self Care | Attending: Orthopedic Surgery

## 2023-08-03 ENCOUNTER — Observation Stay (HOSPITAL_COMMUNITY)
Admission: RE | Admit: 2023-08-03 | Discharge: 2023-08-04 | Disposition: A | Discharge status: Home / Self Care | Payer: Medicare Other | Attending: Orthopedic Surgery | Admitting: Orthopedic Surgery

## 2023-08-03 DIAGNOSIS — M1611 Unilateral primary osteoarthritis, right hip: Principal | ICD-10-CM | POA: Insufficient documentation

## 2023-08-03 DIAGNOSIS — I129 Hypertensive chronic kidney disease with stage 1 through stage 4 chronic kidney disease, or unspecified chronic kidney disease: Secondary | ICD-10-CM | POA: Diagnosis not present

## 2023-08-03 DIAGNOSIS — Z7982 Long term (current) use of aspirin: Secondary | ICD-10-CM | POA: Insufficient documentation

## 2023-08-03 DIAGNOSIS — Z8673 Personal history of transient ischemic attack (TIA), and cerebral infarction without residual deficits: Secondary | ICD-10-CM | POA: Insufficient documentation

## 2023-08-03 DIAGNOSIS — Z96641 Presence of right artificial hip joint: Secondary | ICD-10-CM | POA: Diagnosis present

## 2023-08-03 DIAGNOSIS — Z7902 Long term (current) use of antithrombotics/antiplatelets: Secondary | ICD-10-CM | POA: Diagnosis not present

## 2023-08-03 DIAGNOSIS — N1831 Chronic kidney disease, stage 3a: Secondary | ICD-10-CM

## 2023-08-03 DIAGNOSIS — G459 Transient cerebral ischemic attack, unspecified: Secondary | ICD-10-CM | POA: Diagnosis not present

## 2023-08-03 HISTORY — PX: TOTAL HIP ARTHROPLASTY: SHX124

## 2023-08-03 LAB — TYPE AND SCREEN
ABO/RH(D): O POS
Antibody Screen: NEGATIVE

## 2023-08-03 LAB — ABO/RH: ABO/RH(D): O POS

## 2023-08-03 SURGERY — ARTHROPLASTY, HIP, TOTAL, ANTERIOR APPROACH
Anesthesia: Spinal | Site: Hip | Laterality: Right

## 2023-08-03 MED ORDER — VANCOMYCIN HCL 1000 MG IV SOLR
INTRAVENOUS | Status: DC | PRN
  Administered 2023-08-03: 1250 mg via INTRAVENOUS

## 2023-08-03 MED ORDER — ORAL CARE MOUTH RINSE: 15.0000 mL | Freq: Once | OROMUCOSAL | Status: DC

## 2023-08-03 MED ORDER — ACETAMINOPHEN 325 MG PO TABS: 325.0000 mg | ORAL_TABLET | Freq: Four times a day (QID) | ORAL | Status: DC | PRN

## 2023-08-03 MED ORDER — BUPIVACAINE-EPINEPHRINE (PF) 0.5% -1:200000 IJ SOLN
INTRAMUSCULAR | Status: DC | PRN
  Administered 2023-08-03: 30 mL

## 2023-08-03 MED ORDER — FLEET ENEMA RE ENEM: 1.0000 | ENEMA | Freq: Once | RECTAL | Status: DC | PRN

## 2023-08-03 MED ORDER — DIPHENHYDRAMINE HCL 12.5 MG/5ML PO ELIX: 12.5000 mg | ORAL_SOLUTION | ORAL | Status: DC | PRN

## 2023-08-03 MED ORDER — TRANEXAMIC ACID 1000 MG/10ML IV SOLN
2000.0000 mg | INTRAVENOUS | Status: DC
  Filled 2023-08-03: qty 20

## 2023-08-03 MED ORDER — POLYETHYLENE GLYCOL 3350 17 G PO PACK: 17.0000 g | PACK | Freq: Every day | ORAL | Status: DC | PRN

## 2023-08-03 MED ORDER — MENTHOL 3 MG MT LOZG: 1.0000 | LOZENGE | OROMUCOSAL | Status: DC | PRN

## 2023-08-03 MED ORDER — METOCLOPRAMIDE HCL 5 MG/ML IJ SOLN: 5.0000 mg | Freq: Three times a day (TID) | INTRAMUSCULAR | Status: DC | PRN

## 2023-08-03 MED ORDER — PANTOPRAZOLE SODIUM 40 MG PO TBEC
40.0000 mg | DELAYED_RELEASE_TABLET | Freq: Every day | ORAL | Status: DC
  Administered 2023-08-04: 40 mg via ORAL
  Filled 2023-08-03: qty 1

## 2023-08-03 MED ORDER — OXYCODONE HCL 5 MG PO TABS
5.0000 mg | ORAL_TABLET | ORAL | Status: DC | PRN
  Administered 2023-08-03 – 2023-08-04 (×2): 5 mg via ORAL
  Filled 2023-08-03: qty 1

## 2023-08-03 MED ORDER — LORAZEPAM 0.5 MG PO TABS
0.5000 mg | ORAL_TABLET | Freq: Every day | ORAL | Status: DC
  Administered 2023-08-04: 0.5 mg via ORAL
  Filled 2023-08-03: qty 1

## 2023-08-03 MED ORDER — BUPIVACAINE IN DEXTROSE 0.75-8.25 % IT SOLN
INTRATHECAL | Status: DC | PRN
  Administered 2023-08-03: 1.8 mL via INTRATHECAL

## 2023-08-03 MED ORDER — DEXAMETHASONE SODIUM PHOSPHATE 10 MG/ML IJ SOLN
10.0000 mg | Freq: Once | INTRAMUSCULAR | Status: AC
  Administered 2023-08-04: 10 mg via INTRAVENOUS
  Filled 2023-08-03: qty 1

## 2023-08-03 MED ORDER — HYDROMORPHONE HCL 1 MG/ML IJ SOLN: 0.5000 mg | INTRAMUSCULAR | Status: DC | PRN

## 2023-08-03 MED ORDER — LACTATED RINGERS IV BOLUS
500.0000 mL | Freq: Once | INTRAVENOUS | Status: AC
  Administered 2023-08-03: 500 mL via INTRAVENOUS

## 2023-08-03 MED ORDER — NORTRIPTYLINE HCL 25 MG PO CAPS
100.0000 mg | ORAL_CAPSULE | Freq: Every day | ORAL | Status: DC
  Administered 2023-08-04: 100 mg via ORAL
  Filled 2023-08-03: qty 4

## 2023-08-03 MED ORDER — ONDANSETRON HCL 4 MG PO TABS: 4.0000 mg | ORAL_TABLET | Freq: Four times a day (QID) | ORAL | Status: DC | PRN

## 2023-08-03 MED ORDER — METHOCARBAMOL 500 MG PO TABS
500.0000 mg | ORAL_TABLET | Freq: Four times a day (QID) | ORAL | Status: DC | PRN
  Administered 2023-08-03 – 2023-08-04 (×2): 500 mg via ORAL
  Filled 2023-08-03: qty 1

## 2023-08-03 MED ORDER — POVIDONE-IODINE 10 % EX SWAB: 2.0000 | Freq: Once | CUTANEOUS | Status: DC

## 2023-08-03 MED ORDER — METOCLOPRAMIDE HCL 5 MG PO TABS: 5.0000 mg | ORAL_TABLET | Freq: Three times a day (TID) | ORAL | Status: DC | PRN

## 2023-08-03 MED ORDER — TRANEXAMIC ACID-NACL 1000-0.7 MG/100ML-% IV SOLN
1000.0000 mg | INTRAVENOUS | Status: AC
  Administered 2023-08-03: 1000 mg via INTRAVENOUS
  Filled 2023-08-03: qty 100

## 2023-08-03 MED ORDER — SODIUM CHLORIDE 0.9% FLUSH
INTRAVENOUS | Status: DC | PRN
  Administered 2023-08-03: 50 mL

## 2023-08-03 MED ORDER — METHOCARBAMOL 1000 MG/10ML IJ SOLN: 500.0000 mg | Freq: Four times a day (QID) | INTRAMUSCULAR | Status: DC | PRN

## 2023-08-03 MED ORDER — FENTANYL CITRATE (PF) 100 MCG/2ML IJ SOLN
INTRAMUSCULAR | Status: AC
  Filled 2023-08-03: qty 2

## 2023-08-03 MED ORDER — DOCUSATE SODIUM 100 MG PO CAPS
100.0000 mg | ORAL_CAPSULE | Freq: Two times a day (BID) | ORAL | Status: DC
  Administered 2023-08-04 (×2): 100 mg via ORAL
  Filled 2023-08-03 (×2): qty 1

## 2023-08-03 MED ORDER — ACETAMINOPHEN 500 MG PO TABS
ORAL_TABLET | ORAL | Status: AC
  Filled 2023-08-03: qty 2

## 2023-08-03 MED ORDER — BUPIVACAINE LIPOSOME 1.3 % IJ SUSP: 10.0000 mL | Freq: Once | INTRAMUSCULAR | Status: DC

## 2023-08-03 MED ORDER — LISINOPRIL 10 MG PO TABS
10.0000 mg | ORAL_TABLET | Freq: Every day | ORAL | Status: DC
  Administered 2023-08-04: 10 mg via ORAL
  Filled 2023-08-03: qty 1

## 2023-08-03 MED ORDER — PHENYLEPHRINE 80 MCG/ML (10ML) SYRINGE FOR IV PUSH (FOR BLOOD PRESSURE SUPPORT)
PREFILLED_SYRINGE | INTRAVENOUS | Status: DC | PRN
  Administered 2023-08-03: 160 ug via INTRAVENOUS

## 2023-08-03 MED ORDER — BUPIVACAINE LIPOSOME 1.3 % IJ SUSP
INTRAMUSCULAR | Status: AC
  Filled 2023-08-03: qty 10

## 2023-08-03 MED ORDER — LITHIUM CARBONATE 150 MG PO CAPS
150.0000 mg | ORAL_CAPSULE | Freq: Every day | ORAL | Status: DC
  Administered 2023-08-04: 150 mg via ORAL
  Filled 2023-08-03: qty 1

## 2023-08-03 MED ORDER — BUPIVACAINE LIPOSOME 1.3 % IJ SUSP
INTRAMUSCULAR | Status: DC | PRN
  Administered 2023-08-03: 10 mL

## 2023-08-03 MED ORDER — PHENYLEPHRINE HCL-NACL 20-0.9 MG/250ML-% IV SOLN
INTRAVENOUS | Status: DC | PRN
  Administered 2023-08-03: 50 ug/min via INTRAVENOUS

## 2023-08-03 MED ORDER — BISACODYL 5 MG PO TBEC: 5.0000 mg | DELAYED_RELEASE_TABLET | Freq: Every day | ORAL | Status: DC | PRN

## 2023-08-03 MED ORDER — DEXAMETHASONE SODIUM PHOSPHATE 10 MG/ML IJ SOLN
INTRAMUSCULAR | Status: DC | PRN
  Administered 2023-08-03: 10 mg via INTRAVENOUS

## 2023-08-03 MED ORDER — CHLORHEXIDINE GLUCONATE 0.12 % MT SOLN: 15.0000 mL | Freq: Once | OROMUCOSAL | Status: DC

## 2023-08-03 MED ORDER — FENTANYL CITRATE PF 50 MCG/ML IJ SOSY: 25.0000 ug | PREFILLED_SYRINGE | INTRAMUSCULAR | Status: DC | PRN

## 2023-08-03 MED ORDER — CEFAZOLIN SODIUM-DEXTROSE 2-4 GM/100ML-% IV SOLN
2.0000 g | INTRAVENOUS | Status: AC
  Administered 2023-08-03: 2 g via INTRAVENOUS
  Filled 2023-08-03: qty 100

## 2023-08-03 MED ORDER — FINASTERIDE 5 MG PO TABS
5.0000 mg | ORAL_TABLET | Freq: Every day | ORAL | Status: DC
  Administered 2023-08-04: 5 mg via ORAL
  Filled 2023-08-03: qty 1

## 2023-08-03 MED ORDER — METHOCARBAMOL 500 MG PO TABS
ORAL_TABLET | ORAL | Status: AC
  Filled 2023-08-03: qty 1

## 2023-08-03 MED ORDER — TRANEXAMIC ACID-NACL 1000-0.7 MG/100ML-% IV SOLN
INTRAVENOUS | Status: AC
  Filled 2023-08-03: qty 100

## 2023-08-03 MED ORDER — ONDANSETRON HCL 4 MG/2ML IJ SOLN: 4.0000 mg | Freq: Four times a day (QID) | INTRAMUSCULAR | Status: DC | PRN

## 2023-08-03 MED ORDER — PROPOFOL 10 MG/ML IV BOLUS
INTRAVENOUS | Status: DC | PRN
  Administered 2023-08-03: 50 mg via INTRAVENOUS
  Administered 2023-08-03: 30 mg via INTRAVENOUS
  Administered 2023-08-03: 75 ug/kg/min via INTRAVENOUS

## 2023-08-03 MED ORDER — OXYCODONE HCL 5 MG PO TABS
ORAL_TABLET | ORAL | Status: AC
  Filled 2023-08-03: qty 1

## 2023-08-03 MED ORDER — ONDANSETRON HCL 4 MG/2ML IJ SOLN
INTRAMUSCULAR | Status: DC | PRN
  Administered 2023-08-03: 4 mg via INTRAVENOUS

## 2023-08-03 MED ORDER — BUPIVACAINE-EPINEPHRINE (PF) 0.5% -1:200000 IJ SOLN
INTRAMUSCULAR | Status: AC
  Filled 2023-08-03: qty 30

## 2023-08-03 MED ORDER — PHENOL 1.4 % MT LIQD: 1.0000 | OROMUCOSAL | Status: DC | PRN

## 2023-08-03 MED ORDER — LACTATED RINGERS IV BOLUS
250.0000 mL | Freq: Once | INTRAVENOUS | Status: AC
  Administered 2023-08-03: 250 mL via INTRAVENOUS

## 2023-08-03 MED ORDER — ALUM & MAG HYDROXIDE-SIMETH 200-200-20 MG/5ML PO SUSP: 30.0000 mL | ORAL | Status: DC | PRN

## 2023-08-03 MED ORDER — TRANEXAMIC ACID-NACL 1000-0.7 MG/100ML-% IV SOLN
1000.0000 mg | Freq: Once | INTRAVENOUS | Status: AC
  Administered 2023-08-03: 1000 mg via INTRAVENOUS

## 2023-08-03 MED ORDER — 0.9 % SODIUM CHLORIDE (POUR BTL) OPTIME
TOPICAL | Status: DC | PRN
  Administered 2023-08-03: 1000 mL

## 2023-08-03 MED ORDER — LACTATED RINGERS IV SOLN: INTRAVENOUS | Status: DC

## 2023-08-03 MED ORDER — ASPIRIN 81 MG PO CHEW
81.0000 mg | CHEWABLE_TABLET | Freq: Two times a day (BID) | ORAL | Status: DC
  Administered 2023-08-04 (×2): 81 mg via ORAL
  Filled 2023-08-03 (×2): qty 1

## 2023-08-03 MED ORDER — SODIUM CHLORIDE (PF) 0.9 % IJ SOLN
INTRAMUSCULAR | Status: AC
  Filled 2023-08-03: qty 50

## 2023-08-03 MED ORDER — AMLODIPINE BESYLATE 5 MG PO TABS
5.0000 mg | ORAL_TABLET | Freq: Every day | ORAL | Status: DC
  Administered 2023-08-04: 5 mg via ORAL
  Filled 2023-08-03: qty 1

## 2023-08-03 MED ORDER — ACETAMINOPHEN 500 MG PO TABS
1000.0000 mg | ORAL_TABLET | Freq: Four times a day (QID) | ORAL | Status: AC
  Administered 2023-08-03 – 2023-08-04 (×3): 1000 mg via ORAL
  Filled 2023-08-03 (×2): qty 2

## 2023-08-03 MED ORDER — VANCOMYCIN HCL 1250 MG/250ML IV SOLN
1250.0000 mg | Freq: Once | INTRAVENOUS | Status: DC
  Filled 2023-08-03: qty 250

## 2023-08-03 MED ORDER — KCL IN DEXTROSE-NACL 20-5-0.45 MEQ/L-%-% IV SOLN
INTRAVENOUS | Status: DC
  Filled 2023-08-03 (×2): qty 1000

## 2023-08-03 MED ORDER — OXYCODONE HCL 5 MG PO TABS: 10.0000 mg | ORAL_TABLET | ORAL | Status: DC | PRN

## 2023-08-03 SURGICAL SUPPLY — 38 items
BAG COUNTER SPONGE SURGICOUNT (BAG) ×1 IMPLANT
BAG DECANTER FOR FLEXI CONT (MISCELLANEOUS) ×2 IMPLANT
BLADE SAW SGTL 18X1.27X75 (BLADE) ×1 IMPLANT
CNTNR URN SCR LID CUP LEK RST (MISCELLANEOUS) IMPLANT
COVER PERINEAL POST (MISCELLANEOUS) ×1 IMPLANT
COVER SURGICAL LIGHT HANDLE (MISCELLANEOUS) ×1 IMPLANT
DRAPE STERI IOBAN 125X83 (DRAPES) ×1 IMPLANT
DRAPE U-SHAPE 47X51 STRL (DRAPES) ×2 IMPLANT
DRSG AQUACEL AG ADV 3.5X10 (GAUZE/BANDAGES/DRESSINGS) ×1 IMPLANT
DURAPREP 26ML APPLICATOR (WOUND CARE) ×1 IMPLANT
ELECT BLADE TIP CTD 4 INCH (ELECTRODE) ×1 IMPLANT
ELECT REM PT RETURN 15FT ADLT (MISCELLANEOUS) ×1 IMPLANT
ELIMINATOR HOLE APEX DEPUY (Hips) IMPLANT
GLOVE BIO SURGEON STRL SZ7.5 (GLOVE) ×1 IMPLANT
GLOVE BIO SURGEON STRL SZ8.5 (GLOVE) ×1 IMPLANT
GLOVE BIOGEL PI IND STRL 8 (GLOVE) ×1 IMPLANT
GLOVE BIOGEL PI IND STRL 9 (GLOVE) ×1 IMPLANT
GOWN STRL REUS W/ TWL XL LVL3 (GOWN DISPOSABLE) ×2 IMPLANT
HEAD M SROM 36MM PLUS 1.5 (Hips) IMPLANT
HOLDER FOLEY CATH W/STRAP (MISCELLANEOUS) ×1 IMPLANT
KIT TURNOVER KIT A (KITS) IMPLANT
MANIFOLD NEPTUNE II (INSTRUMENTS) ×1 IMPLANT
NDL HYPO 21X1.5 SAFETY (NEEDLE) ×2 IMPLANT
NEEDLE HYPO 21X1.5 SAFETY (NEEDLE) ×2
NS IRRIG 1000ML POUR BTL (IV SOLUTION) ×1 IMPLANT
PACK ANTERIOR HIP CUSTOM (KITS) ×1 IMPLANT
PIN SECT CUP 56MM (Hips) IMPLANT
PINNACLE ALTRX PLUS 4 N 36X56 (Hips) IMPLANT
SPIKE FLUID TRANSFER (MISCELLANEOUS) ×1 IMPLANT
SROM M HEAD 36MM PLUS 1.5 (Hips) ×1 IMPLANT
STEM FEMORAL SZ9 STD ACTIS (Stem) IMPLANT
SUT VIC AB 0 CT1 27XBRD ANBCTR (SUTURE) ×1 IMPLANT
SUT VIC AB 1 CTX36XBRD ANBCTR (SUTURE) ×1 IMPLANT
SUT VIC AB 2-0 CT1 TAPERPNT 27 (SUTURE) ×1 IMPLANT
SUT VICRYL+ 3-0 36IN CT-1 (SUTURE) ×1 IMPLANT
SYR CONTROL 10ML LL (SYRINGE) ×3 IMPLANT
TRAY FOLEY MTR SLVR 16FR STAT (SET/KITS/TRAYS/PACK) IMPLANT
TUBE SUCTION HIGH CAP CLEAR NV (SUCTIONS) ×1 IMPLANT

## 2023-08-03 NOTE — Progress Notes (Signed)
 Patient was doing rehab with PT at bedside had walked with walker to bathroom and back was not able to void,he was then ambulated with rolling walker to recliner to do exercises,theraptist stated patient started not mentating well verbally got clammy to touch,and passed out he was lowered to the chair placed in trendelenberg position,vs assessed found bp had dropped from 140/55 to 102/45 with patient symptomatic with bp changes. He was then transferred back to bed MD notified ordered fluid bolus 500 ml's in addition to bolus he received earlier. MD gave orders for patient be admitted to observation. Family at bedside aware of above and new orders.

## 2023-08-03 NOTE — Anesthesia Procedure Notes (Signed)
 Spinal  Patient location during procedure: OR Start time: 08/03/2023 12:08 PM End time: 08/03/2023 12:10 PM Reason for block: surgical anesthesia Staffing Performed: anesthesiologist  Anesthesiologist: Niels Marien CROME, MD Performed by: Niels Marien CROME, MD Authorized by: Niels Marien CROME, MD   Preanesthetic Checklist Completed: patient identified, IV checked, risks and benefits discussed, surgical consent, monitors and equipment checked, pre-op evaluation and timeout performed Spinal Block Patient position: sitting Prep: DuraPrep and site prepped and draped Patient monitoring: cardiac monitor, continuous pulse ox and blood pressure Approach: midline Location: L3-4 Injection technique: single-shot Needle Needle type: Pencan  Needle gauge: 24 G Needle length: 9 cm Assessment Sensory level: T6 Events: CSF return Additional Notes Functioning IV was confirmed and monitors were applied. Sterile prep and drape, including hand hygiene and sterile gloves were used. The patient was positioned and the spine was prepped. The skin was anesthetized with lidocaine .  Free flow of clear CSF was obtained prior to injecting local anesthetic into the CSF.  The spinal needle aspirated freely following injection.  The needle was carefully withdrawn.  The patient tolerated the procedure well.

## 2023-08-03 NOTE — Transfer of Care (Signed)
 Immediate Anesthesia Transfer of Care Note  Patient: Chase Mathis  Procedure(s) Performed: RIGHT TOTAL HIP ARTHROPLASTY ANTERIOR APPROACH (Right: Hip)  Patient Location: PACU  Anesthesia Type:MAC combined with regional for post-op pain  Level of Consciousness: sedated, drowsy, and patient cooperative  Airway & Oxygen Therapy: Patient connected to face mask oxygen  Post-op Assessment: Report given to RN and Post -op Vital signs reviewed and stable  Post vital signs: stable  Last Vitals:  Vitals Value Taken Time  BP 147/67 08/03/23 1357  Temp    Pulse    Resp 26 08/03/23 1400  SpO2    Vitals shown include unfiled device data.  Last Pain:  Vitals:   08/03/23 1008  PainSc: 0-No pain         Complications: No notable events documented.

## 2023-08-03 NOTE — Op Note (Signed)
 PATIENT ID:      Chase Mathis  MRN:     [id number] DOB/AGE:    12/12/1933 / 89 y.o.  OPERATIVE REPORT   DATE OF PROCEDURE:  08/03/2023      PREOPERATIVE DIAGNOSIS:  RIGHT HIP OSTEOARTHRITIS                                                         POSTOPERATIVE DIAGNOSIS:  Same                                                         PROCEDURE: Anterior R total hip arthroplasty using a 56 mm DePuy Pinnacle  Cup, [employer], 0-degree polyethylene liner, a +1.5 mm x 36mm metal head, a 9S Depuy Actis stem  SURGEON: Dempsey JINNY Sensor  ASSISTANT:   Camellia POUR. [employer]  (present throughout entire procedure and necessary for timely completion of the procedure)   ANESTHESIA: Spinal, Exparel  133mg  injection BLOOD LOSS: 200 cc FLUID REPLACEMENT: 1500 cc crystalloid TRANEXAMIC ACID : 1gm IV, 2gm Topical COMPLICATIONS: none    INDICATIONS FOR PROCEDURE: A 89 y.o. year-old With  RIGHT HIP OSTEOARTHRITIS   for 2 years, x-rays show bone-on-bone arthritic changes, and osteophytes. Despite conservative measures with observation, anti-inflammatory medicine, narcotics, use of a cane, has severe unremitting pain and can ambulate only a few blocks before resting. Patient desires elective R total hip arthroplasty to decrease pain and increase function. The risks, benefits, and alternatives were discussed at length including but not limited to the risks of infection, bleeding, nerve injury, stiffness, blood clots, the need for revision surgery, cardiopulmonary complications, among others, and they were willing to proceed. Questions answered      PROCEDURE IN DETAIL: The patient was identified by armband, received preoperative IV antibiotics, in the holding area at [facility], taken to the operating room , appropriate anesthetic monitors were attached and anesthesia was induced with the patient on the gurney. HANA boots were applied to the feet, and the patient  was transferred to the HANA table with  a peroneal post and support underneath the non-operative leg. The operative lower extremity was then prepped and draped in the usual sterile fashion from just above the iliac crest to the knee. And a timeout procedure was performed. Camellia POUR. [facility] was present and scrubbed throughout the case, critical for assistance with, positioning, exposure, retraction, instrumentation, and closure.Skin along incision area was injected with 10 cc of Exparel  solution. We then made a 13 cm incision along the interval at the leading edge of the tensor fascia lata of starting at 2 cm lateral to the ASIS. Small bleeders in the skin and subcutaneous tissue identified and cauterized we dissected down to the fascia and made an incision in the fascia allowing us  to elevate the fascia of the tensor muscle and exploited the interval between the rectus and the tensor fascia lata. A Cobra retractor was then placed along the superior neck of the femur. A cerebellar retractor was used to expose the interval between the tensor fascia lata and the rectus femoris.  We identified and cauterized the ascending  branch of the anterior circumflex artery. A second Cobra retractor along the inferior neck of the femur. A small Hohmann retractor was placed underneath the origin of the rectus femoris, giving us  good medial exposure. Using Ronguers fatty tissue was removed from in front of the anterior capsule. The capsule was then incised, starting out at the superior anterior rim of the acetabulum going laterally along the anterior neck. The capsule was then teed along the neck superiorly and inferiorly. Electrocautery was used to release capsule from the anterior and medial neck of the femur to allow external rotation. The cobra retractors were then placed along the inferior and superior neck. The hip was externally rotated to 40 degrees, traction applied and locked. We perform a standard neck cut with the oscillating saw and removed the femoral head  with a power corkscrew. We then placed a medium curved medium homan retractor in the cotyloid notch and standard cobra retractor posteriorly along the acetabular rim. Exposed labral tissue and osteophytes were then removed. We then sequentially reamed up to a 55 mm basket reamer obtaining good coverage in all quadrants, verified by C-arm imaging. Under C-arm control we then hammered into place a 56 mm Pinnacle cup in 45 of abduction and 15 of anteversion. The cup seated nicely and required no supplemental screws. We then placed a central hole Eliminator and a +4 mm, 0 polyethylene liner. The foot was then externally rotated to 130-140. The limb was extended and adducted to the floor, delivering the proximal femur up into the wound. A medium curved Hohmann retractor was placed over the greater trochanter and a long Homan retractor along the posterior femoral neck completing the exposure and lateralizing the femur. We then performed releases superiorly and and inferiorly of the capsule going back to the pirformis fossa superiorly and to the lesser trochanter inferiorly. We then entered the proximal femur with the box cutting offset chisel followed by, a canal sounder, the chili pepper and broaching up to a 9 broach. This seated nicely and we reamed the calcar. A trial reduction was performed with a 1.5 mm X 36 mm head.The limb lengths were checked by c-arm, and the hip was stable in 90 of external rotation. At this point the trial components removed and we hammered into place a std  Offset # 9Actis stem with coating. A + 1.5 mm x 36 head was then hammered into place. The hip was reduced and final C-arm images obtained. The wound was thoroughly irrigated with normal saline solution. We repaired the ant capsule and the tensor fascia lot a with running 0 vicryl suture. the subcutaneous and subcuticular layers were closed with running 3-0 Vicryl suture followed by an Aquacil dressing. At this point the patient was  awaken and transferred to hospital gurney without difficulty.   Dempsey JINNY Sensor 08/03/2023, 11:58 AM

## 2023-08-03 NOTE — Interval H&P Note (Signed)
 History and Physical Interval Note:  08/03/2023 11:57 AM  Chase Mathis  has presented today for surgery, with the diagnosis of RIGHT HIP OSTEOARTHRITIS.  The various methods of treatment have been discussed with the patient and family. After consideration of risks, benefits and other options for treatment, the patient has consented to  Procedure(s): RIGHT TOTAL HIP ARTHROPLASTY ANTERIOR APPROACH (Right) as a surgical intervention.  The patient's history has been reviewed, patient examined, no change in status, stable for surgery.  I have reviewed the patient's chart and labs.  Questions were answered to the patient's satisfaction.     Dempsey JINNY Sensor

## 2023-08-03 NOTE — Discharge Instructions (Addendum)

## 2023-08-03 NOTE — Evaluation (Signed)
 Physical Therapy Evaluation Patient Details Name: Chase Mathis MRN: [id number] DOB: [birth year] Today's Date: 08/03/2023  History of Present Illness  89 yo male presents to therapy s/p R THA, anterior approach on 08/03/2023 due to failure of conservative measures. Pt PMH includes but is not limited to: OA, TIA, carotid stenosis, CKD III, memory loss/mild cognitive impairment, HTN, and peripheral neuropathy.  Clinical Impression   Chase Mathis is a 89 y.o. male POD 0 s/p R THA. Patient reports mod I with mobility at baseline. Patient is now limited by functional impairments (see PT problem list below) and requires CGA for sit to supine and mod A x 2 for sit to supine  for bed mobility and CGA for initial transfer prior to LOC episode then extensive assist following for transfers. Patient was able to ambulate 40 feet x 2 to and from the bathroom with RW and CGA level of assist with cues for posture and proper distance from RW. Patient instructed in exercise to facilitate ROM and circulation to manage edema supine and 2/3 of standing at RW.  During the standing TE following gait to and from the bathroom, pt began to repetitively push the RW forward and unable to redirect and pt no longer following commands appropriately, pt required extensive assist and facilitation for hip and trunk flexion to transition from standing to sitting in recliner, with pts daughter providing additional assist and cues. Once in recliner Pts LE elevated and pt exhibited LOC episode (PT unclear of duration), recliner in lowest position and nursing staff assisting therapist. Bp obtained, sternal rubs and pt resumed consciousness. Pt then required extensive assist to return to bed. Pt to be admitted to observation and is not appropriate for same day d/c.  Patient will benefit from continued skilled PT interventions to address impairments and progress towards PLOF. Acute PT will follow to progress mobility and stair training in  preparation for safe discharge to ILF with family support and HH services. Bp semi reclined at rest prior to PT eval 140/55 Bp s/p LOC episode 102/45 indicative of symptomatic orthostatic hypotension       If plan is discharge home, recommend the following: A little help with walking and/or transfers;A little help with bathing/dressing/bathroom;Assistance with cooking/housework;Assist for transportation   Can travel by private vehicle        Equipment Recommendations None recommended by PT  Recommendations for Other Services       Functional Status Assessment Patient has had a recent decline in their functional status and demonstrates the ability to make significant improvements in function in a reasonable and predictable amount of time.     Precautions / Restrictions Precautions Precautions: Fall Restrictions Weight Bearing Restrictions Per Provider Order: No      Mobility  Bed Mobility Overal bed mobility: Needs Assistance Bed Mobility: Supine to Sit, Sit to Supine     Supine to sit: Contact guard, HOB elevated, Used rails Sit to supine: Mod assist, +2 for physical assistance, +2 for safety/equipment   General bed mobility comments: min cues for supine to sit, sit to supine s/p LOC episode and pt requiring increased physical assist    Transfers Overall transfer level: Needs assistance Equipment used: Rolling walker (2 wheels) Transfers: Sit to/from Stand Sit to Stand: Contact guard assist           General transfer comment: min cues for first sit to stand from EOB, pt standing and having difficulty with following commands and mod A x2 to guide pt  to recliner and then pt subsequenting had LOC episode, following LOC episode pt required mod A x 3 for SPT from recliner to bed, with noted difficulty processing and following commands with strong mutlimodal cues to assist pt to bed with nursing staff    Ambulation/Gait Ambulation/Gait assistance: Contact guard  assist Gait Distance (Feet): 40 Feet Assistive device: Rolling walker (2 wheels) Gait Pattern/deviations: Step-to pattern, Antalgic, Trunk flexed Gait velocity: decreased     General Gait Details: trunk flexion with B UE support at RW to offload R LE in stance phase, pt required constant cues for proper posture and distance from RW with pt pushing walker anteriorly or making attempts to access commode, sink and recliner without use of AD, PT reinforced importance of use of RW. pt had LOC episode following gait assessment once returned to bay in PACU while particpating with LE standing TE  Stairs            Wheelchair Mobility     Tilt Bed    Modified Rankin (Stroke Patients Only)       Balance Overall balance assessment: Needs assistance, History of Falls Sitting-balance support: Feet supported Sitting balance-Leahy Scale: Good     Standing balance support: Bilateral upper extremity supported, During functional activity, Reliant on assistive device for balance Standing balance-Leahy Scale: Poor                               Pertinent Vitals/Pain Pain Assessment Pain Assessment: 0-10 Pain Score: 2  Pain Location: R hip Pain Descriptors / Indicators: Aching, Constant, Discomfort, Dull, Operative site guarding Pain Intervention(s): Limited activity within patient's tolerance, Premedicated before session, Monitored during session, Repositioned, Ice applied    Home Living Family/patient expects to be discharged to:: Private residence Living Arrangements: Spouse/significant other Available Help at Discharge: Family;Available 24 hours/day Type of Home: Independent living facility ([facility]) Home Access: Level entry       Home Layout: One level Home Equipment: Cane - single point;Shower seat - built in;Grab bars - toilet;Grab bars - tub/shower;Rolling Walker (2 wheels)      Prior Function Prior Level of Function : Independent/Modified Independent              Mobility Comments: Amb with cane or furniture surfing in appartment and use of RW for longer facility distance. Goes to dining room for 1 meal/day.       Extremity/Trunk Assessment        Lower Extremity Assessment Lower Extremity Assessment: RLE deficits/detail RLE Deficits / Details: ankle DF/PF 5/5 RLE Sensation: WNL    Cervical / Trunk Assessment Cervical / Trunk Assessment: Normal  Communication   Communication Communication: No apparent difficulties  Cognition Arousal: Alert Behavior During Therapy: WFL for tasks assessed/performed Overall Cognitive Status: Within Functional Limits for tasks assessed                                          General Comments      Exercises Total Joint Exercises Ankle Circles/Pumps: AROM, Both, 10 reps Quad Sets: AROM, Right, 5 reps Heel Slides: AROM, Right, 5 reps Hip ABduction/ADduction: AROM, Right, Both, Standing Knee Flexion: AROM, Right, 5 reps, Standing   Assessment/Plan    PT Assessment Patient needs continued PT services  PT Problem List Decreased strength;Decreased range of motion;Decreased activity tolerance;Decreased balance;Decreased mobility;Pain  PT Treatment Interventions DME instruction;Gait training;Functional mobility training;Therapeutic exercise;Therapeutic activities;Balance training;Neuromuscular re-education;Patient/family education;Modalities    PT Goals (Current goals can be found in the Care Plan section)  Acute Rehab PT Goals Patient Stated Goal: to be able to walk around the campus without AD and or pain PT Goal Formulation: With patient Time For Goal Achievement: 08/17/23 Potential to Achieve Goals: Good    Frequency 7X/week     Co-evaluation               AM-PAC PT 6 Clicks Mobility  Outcome Measure Help needed turning from your back to your side while in a flat bed without using bedrails?: A Little Help needed moving from lying on your back to  sitting on the side of a flat bed without using bedrails?: A Little Help needed moving to and from a bed to a chair (including a wheelchair)?: A Little Help needed standing up from a chair using your arms (e.g., wheelchair or bedside chair)?: A Little Help needed to walk in hospital room?: A Little Help needed climbing 3-5 steps with a railing? : A Lot 6 Click Score: 17    End of Session Equipment Utilized During Treatment: Gait belt Activity Tolerance: Treatment limited secondary to medical complications (Comment) (LOC) Patient left: in bed;with call bell/phone within reach;with family/visitor present;with nursing/sitter in room Nurse Communication: Mobility status PT Visit Diagnosis: Unsteadiness on feet (R26.81);Other abnormalities of gait and mobility (R26.89);Muscle weakness (generalized) (M62.81);Difficulty in walking, not elsewhere classified (R26.2);Pain Pain - Right/Left: Right Pain - part of body: Leg;Hip    Time: 8342-8248 PT Time Calculation (min) (ACUTE ONLY): 54 min   Charges:   PT Evaluation $PT Eval Low Complexity: 1 Low PT Treatments $Gait Training: 8-22 mins $Therapeutic Exercise: 8-22 mins $Therapeutic Activity: 8-22 mins PT General Charges $$ ACUTE PT VISIT: 1 Visit         [place], PT Acute Rehab   [place] VEAR Drone 08/03/2023, 7:07 PM

## 2023-08-04 ENCOUNTER — Other Ambulatory Visit (HOSPITAL_COMMUNITY): Payer: Self-pay

## 2023-08-04 DIAGNOSIS — M1611 Unilateral primary osteoarthritis, right hip: Secondary | ICD-10-CM | POA: Diagnosis not present

## 2023-08-04 DIAGNOSIS — Z7982 Long term (current) use of aspirin: Secondary | ICD-10-CM | POA: Diagnosis not present

## 2023-08-04 DIAGNOSIS — I129 Hypertensive chronic kidney disease with stage 1 through stage 4 chronic kidney disease, or unspecified chronic kidney disease: Secondary | ICD-10-CM | POA: Diagnosis not present

## 2023-08-04 DIAGNOSIS — Z8673 Personal history of transient ischemic attack (TIA), and cerebral infarction without residual deficits: Secondary | ICD-10-CM | POA: Diagnosis not present

## 2023-08-04 DIAGNOSIS — N1831 Chronic kidney disease, stage 3a: Secondary | ICD-10-CM | POA: Diagnosis not present

## 2023-08-04 DIAGNOSIS — Z7902 Long term (current) use of antithrombotics/antiplatelets: Secondary | ICD-10-CM | POA: Diagnosis not present

## 2023-08-04 NOTE — Progress Notes (Signed)
 Physical Therapy Treatment Patient Details Name: Chase Mathis MRN: [id number] DOB: 04-11-1934 Today's Date: 08/04/2023   History of Present Illness 89 yo male presents to therapy s/p R THA, anterior approach on 08/03/2023 due to failure of conservative measures. Pt PMH includes but is not limited to: OA, TIA, carotid stenosis, CKD III, memory loss/mild cognitive impairment, HTN, and peripheral neuropathy.    PT Comments  Pt ambulated good distance in hallway with RW and denies any dizziness today.  Pt declined performing exercises, plans to start them with HHPT.  Pt's family present and okay with discharge home today.  Pt eager and ready for d/c home.  Pt and family had no further questions.     If plan is discharge home, recommend the following: A little help with walking and/or transfers;A little help with bathing/dressing/bathroom;Assistance with cooking/housework;Assist for transportation   Can travel by private vehicle        Equipment Recommendations  None recommended by PT    Recommendations for Other Services       Precautions / Restrictions Precautions Precautions: Fall Restrictions Other Position/Activity Restrictions: WBAT     Mobility  Bed Mobility Overal bed mobility: Needs Assistance Bed Mobility: Supine to Sit     Supine to sit: Contact guard, HOB elevated, Used rails     General bed mobility comments: cues for self assist    Transfers Overall transfer level: Needs assistance Equipment used: Rolling walker (2 wheels) Transfers: Sit to/from Stand Sit to Stand: Contact guard assist           General transfer comment: verbal cues for UE and LE positioning    Ambulation/Gait Ambulation/Gait assistance: Contact guard assist Gait Distance (Feet): 160 Feet Assistive device: Rolling walker (2 wheels) Gait Pattern/deviations: Step-to pattern, Antalgic, Trunk flexed, Decreased stance time - right Gait velocity: decreased     General Gait Details:  verbal cues for sequence, step length, RW positioning and posture; distance to tolerance; pt denies any dizziness   Stairs             Wheelchair Mobility     Tilt Bed    Modified Rankin (Stroke Patients Only)       Balance                                            Cognition Arousal: Alert Behavior During Therapy: WFL for tasks assessed/performed Overall Cognitive Status: Within Functional Limits for tasks assessed                                          Exercises      General Comments        Pertinent Vitals/Pain Pain Assessment Pain Assessment: 0-10 Pain Score: 3  Pain Location: R hip Pain Descriptors / Indicators: Aching, Sore Pain Intervention(s): Repositioned, Monitored during session    Home Living                          Prior Function            PT Goals (current goals can now be found in the care plan section) Progress towards PT goals: Progressing toward goals    Frequency    7X/week      PT Plan  Co-evaluation              AM-PAC PT 6 Clicks Mobility   Outcome Measure  Help needed turning from your back to your side while in a flat bed without using bedrails?: A Little Help needed moving from lying on your back to sitting on the side of a flat bed without using bedrails?: A Little Help needed moving to and from a bed to a chair (including a wheelchair)?: A Little Help needed standing up from a chair using your arms (e.g., wheelchair or bedside chair)?: A Little Help needed to walk in hospital room?: A Little Help needed climbing 3-5 steps with a railing? : A Little 6 Click Score: 18    End of Session Equipment Utilized During Treatment: Gait belt Activity Tolerance: Patient tolerated treatment well Patient left: in chair;with call bell/phone within reach;with family/visitor present Nurse Communication: Mobility status PT Visit Diagnosis: Other abnormalities of  gait and mobility (R26.89)     Time: 8885-8868 PT Time Calculation (min) (ACUTE ONLY): 17 min  Charges:    $Gait Training: 8-22 mins PT General Charges $$ ACUTE PT VISIT: 1 Visit                     Tari KLEIN, DPT Physical Therapist Acute Rehabilitation Services Office: [phone]    Tari CROME [place] 08/04/2023, 3:19 PM

## 2023-08-04 NOTE — Plan of Care (Signed)

## 2023-08-04 NOTE — Discharge Summary (Addendum)
 Patient ID: Chase Mathis MRN: [id number] DOB/AGE: 07/30/1933 89 y.o.  Admit date: 08/03/2023 Discharge date: 08/04/2023  Admission Diagnoses:  Principal Problem:   Osteoarthritis of right hip Active Problems:   S/P total right hip arthroplasty   Discharge Diagnoses:  Same  Past Medical History:  Diagnosis Date   Arthritis    Benign non-nodular prostatic hyperplasia with lower urinary tract symptoms 12/21/2014   Chronic sinusitis 08/23/2017   Combined forms of age-related cataract of both eyes 06/15/2016   Elevated prostate specific antigen (PSA) 07/03/2011   Epiretinal membrane, bilateral 06/03/2015   Epistaxis 08/16/2017   History of electroconvulsive therapy    History of kidney stones    Hypertension    Left ureteral stone 01/28/2015   Lithium -induced tremor    Major depressive disorder in remission    MCI (mild cognitive impairment) with memory loss 01/05/2021   Peripheral neuropathy    Recurrent nephrolithiasis 09/02/2017   Vitreous syneresis of both eyes 06/03/2015    Surgeries: Procedure(s): RIGHT TOTAL HIP ARTHROPLASTY ANTERIOR APPROACH on 08/03/2023   Consultants:   Discharged Condition: Improved  Hospital Course: Chase Mathis is an 89 y.o. male who was admitted 08/03/2023 for operative treatment of Osteoarthritis of right hip. Patient has severe unremitting pain that affects sleep, daily activities, and work/hobbies. After pre-op clearance the patient was taken to the operating room on 08/03/2023 and underwent  Procedure(s): RIGHT TOTAL HIP ARTHROPLASTY ANTERIOR APPROACH.    Patient was given perioperative antibiotics:  Anti-infectives (From admission, onward)    Start     Dose/Rate Route Frequency Ordered Stop   08/03/23 1245  vancomycin  (VANCOREADY) IVPB 1250 mg/250 mL        1,250 mg 166.7 mL/hr over 90 Minutes Intravenous  Once 08/03/23 1238     08/03/23 1015  ceFAZolin  (ANCEF ) IVPB 2g/100 mL premix        2 g 200 mL/hr over 30 Minutes Intravenous  On call to O.R. 08/03/23 1003 08/03/23 1224        Patient was given sequential compression devices, early ambulation to prevent DVT.  Patient benefited maximally from hospital stay and there were no complications.    Recent vital signs: Patient Vitals for the past 24 hrs:  BP Temp Temp src Pulse Resp SpO2  08/04/23 0958 (!) 151/50 97.8 F (36.6 C) Oral 77 18 99 %  08/04/23 0619 (!) 141/50 98.2 F (36.8 C) Oral 72 20 92 %  08/04/23 0114 (!) 122/57 97.9 F (36.6 C) Oral 69 20 95 %  08/03/23 2304 (!) 156/67 98.1 F (36.7 C) Oral 71 16 100 %  08/03/23 2245 137/60 -- -- 70 -- 95 %  08/03/23 2230 138/64 98 F (36.7 C) -- 68 20 92 %  08/03/23 2215 (!) 141/76 -- -- 68 -- 95 %  08/03/23 2202 (!) 131/59 -- -- 69 -- 92 %  08/03/23 2200 (!) 131/59 98.4 F (36.9 C) -- 68 20 91 %  08/03/23 2130 (!) 158/64 98.4 F (36.9 C) -- 71 20 98 %  08/03/23 2030 (!) 150/60 -- -- -- -- --  08/03/23 1930 (!) 143/62 -- -- 65 18 99 %  08/03/23 1915 (!) 125/95 -- -- 64 20 96 %  08/03/23 1900 137/62 97.6 F (36.4 C) -- 65 20 100 %  08/03/23 1845 (!) 138/58 -- -- 65 -- 98 %  08/03/23 1830 138/63 -- -- 64 -- 100 %  08/03/23 1815 (!) 121/59 -- -- 63 -- 99 %  08/03/23  1800 (!) 109/51 -- -- 66 -- 93 %  08/03/23 1745 -- -- -- -- 16 --  08/03/23 1715 (!) 140/55 -- -- -- 20 98 %  08/03/23 1700 (!) 124/57 97.8 F (36.6 C) -- 65 18 99 %  08/03/23 1600 (!) 126/59 -- -- (!) 58 -- 93 %  08/03/23 1545 (!) 142/72 (!) 97.5 F (36.4 C) -- (!) 58 20 100 %  08/03/23 1541 (!) 142/72 (!) 97.5 F (36.4 C) Oral (!) 59 20 100 %  08/03/23 1530 (!) 117/54 -- -- (!) 57 13 100 %  08/03/23 1515 (!) 133/56 97.6 F (36.4 C) -- (!) 56 15 100 %  08/03/23 1500 (!) 121/53 -- -- (!) 54 11 98 %  08/03/23 1445 (!) 111/55 -- -- (!) 53 20 98 %  08/03/23 1437 -- -- -- (!) 52 13 100 %  08/03/23 1430 (!) 112/54 -- -- (!) 54 17 100 %  08/03/23 1425 -- -- -- (!) 54 15 98 %  08/03/23 1415 (!) 90/51 -- -- (!) 53 (!) 24 95 %  08/03/23 1400  (!) 99/45 -- -- -- 20 --  08/03/23 1357 (!) 147/67 (!) 97.4 F (36.3 C) -- 66 (!) 25 98 %     Discharge Medications:   Allergies as of 08/04/2023   No Known Allergies      Medication List     TAKE these medications    amLODipine  5 MG tablet Commonly known as: NORVASC  Take 5 mg by mouth daily.   aspirin  81 MG chewable tablet Chew 1 tablet (81 mg total) by mouth daily.   clopidogrel  75 MG tablet Commonly known as: PLAVIX  Take 1 tablet (75 mg total) by mouth daily.   finasteride  5 MG tablet Commonly known as: PROSCAR  Take 1 tablet (5 mg total) by mouth daily.   lisinopril  10 MG tablet Commonly known as: ZESTRIL  Take 10 mg by mouth daily.   lithium  carbonate 150 MG capsule TAKE 3 CAPSULES (450 MG TOTAL) BY MOUTH AT BEDTIME.   LORazepam  0.5 MG tablet Commonly known as: ATIVAN  TAKE ONE TABLET AT BEDTIME AS NEEDED FOR ANXIETY   nortriptyline  50 MG capsule Commonly known as: PAMELOR  Take 2 capsules (100 mg total) by mouth at bedtime.   rosuvastatin  20 MG tablet Commonly known as: CRESTOR  Take 1 tablet (20 mg total) by mouth at bedtime.               Durable Medical Equipment  (From admission, onward)           Start     Ordered   08/03/23 2303  DME Walker rolling  Once       Question:  Patient needs a walker to treat with the following condition  Answer:  Status post right hip replacement   08/03/23 2302   08/03/23 2303  DME 3 n 1  Once        08/03/23 2302              Discharge Care Instructions  (From admission, onward)           Start     Ordered   08/03/23 0000  Weight bearing as tolerated        08/03/23 1404            Diagnostic Studies: DG HIP UNILAT WITH PELVIS 1V RIGHT Result Date: 08/03/2023 CLINICAL DATA:  Right hip arthrogram plasty. Intraoperative fluoroscopy. EXAM: DG HIP (WITH OR WITHOUT PELVIS) 1V RIGHT COMPARISON:  CT abdomen and pelvis 12/19/2017 FINDINGS: Images were performed intraoperatively without the  presence of a radiologist. New total right hip arthroplasty. No hardware complication is seen. Total fluoroscopy images: Total fluoroscopy time: 13 seconds Total dose: Radiation Exposure Index (as provided by the fluoroscopic device): 1.77 mGy air Kerma Please see intraoperative findings for further detail. IMPRESSION: Intraoperative fluoroscopy for total right hip arthroplasty. Electronically Signed   By: Tanda Lyons M.D.   On: 08/03/2023 14:41   DG C-Arm 1-60 Min-No Report Result Date: 08/03/2023 Fluoroscopy was utilized by the requesting physician.  No radiographic interpretation.   VAS US  CAROTID Result Date: 07/26/2023 Carotid Arterial Duplex Study Patient Name:  Chase Mathis  Date of Exam:   07/26/2023 Medical Rec #: [id number]       Accession #:    [id number] Date of Birth: [birth year]       Patient Gender: M Patient Age:   66 years Exam Location:  [facility] Procedure:      VAS US  CAROTID Referring Phys: FONDA ROBINS --------------------------------------------------------------------------------  Indications:       Carotid artery disease. Risk Factors:      Hypertension, no history of smoking, prior CVA. Comparison Study:  04/25/2023                    R=1-39 due to end diastolic, L=1-39 Performing Technologist: Geni Lodge RVS, RCS  Examination Guidelines: A complete evaluation includes B-mode imaging, spectral Doppler, color Doppler, and power Doppler as needed of all accessible portions of each vessel. Bilateral testing is considered an integral part of a complete examination. Limited examinations for reoccurring indications may be performed as noted.  Right Carotid Findings: +----------+--------+--------+--------+----------------------+--------+           PSV cm/sEDV cm/sStenosisPlaque Description    Comments +----------+--------+--------+--------+----------------------+--------+ CCA Prox  86      11                                              +----------+--------+--------+--------+----------------------+--------+ CCA Mid   123     15                                             +----------+--------+--------+--------+----------------------+--------+ CCA Distal119     17                                             +----------+--------+--------+--------+----------------------+--------+ ICA Prox  223     44      40-59%  irregular and calcific         +----------+--------+--------+--------+----------------------+--------+ ICA Mid   177     31                                             +----------+--------+--------+--------+----------------------+--------+ ICA Distal79      15                                             +----------+--------+--------+--------+----------------------+--------+  ECA       190     15                                             +----------+--------+--------+--------+----------------------+--------+ +----------+--------+-------+----------------+-------------------+           PSV cm/sEDV cmsDescribe        Arm Pressure (mmHG) +----------+--------+-------+----------------+-------------------+ [id number]            Multiphasic, [id number]                 +----------+--------+-------+----------------+-------------------+ +---------+--------+--+--------+-+---------+ VertebralPSV cm/s50EDV cm/s9Antegrade +---------+--------+--+--------+-+---------+  Left Carotid Findings: +----------+--------+--------+--------+------------------+--------+           PSV cm/sEDV cm/sStenosisPlaque DescriptionComments +----------+--------+--------+--------+------------------+--------+ CCA Prox  124     17                                         +----------+--------+--------+--------+------------------+--------+ CCA Mid   134     18                                         +----------+--------+--------+--------+------------------+--------+ CCA Distal128     17                                          +----------+--------+--------+--------+------------------+--------+ ICA Prox  146     23      1-39%   heterogenous               +----------+--------+--------+--------+------------------+--------+ ICA Mid   84      20                                         +----------+--------+--------+--------+------------------+--------+ ICA Distal88      18                                         +----------+--------+--------+--------+------------------+--------+ ECA       166     14                                         +----------+--------+--------+--------+------------------+--------+ +----------+--------+--------+----------------+-------------------+           PSV cm/sEDV cm/sDescribe        Arm Pressure (mmHG) +----------+--------+--------+----------------+-------------------+ [id number]             Multiphasic, [id number]                 +----------+--------+--------+----------------+-------------------+ +---------+--------+--+--------+-+---------+ VertebralPSV cm/s56EDV cm/s9Antegrade +---------+--------+--+--------+-+---------+   Summary: Right Carotid: Velocities in the right ICA are consistent with a 40-59%                stenosis. Left Carotid: Velocities in the left ICA are consistent with a 1-39% stenosis. Vertebrals:  Bilateral vertebral arteries demonstrate antegrade flow. Subclavians: Normal flow hemodynamics were seen in bilateral subclavian  arteries. *See table(s) above for measurements and observations.  Electronically signed by Fonda Rim on 07/26/2023 at 5:01:37 PM.    Final     Disposition: Discharge disposition: 01-Home or Self Care       Discharge Instructions     Call MD / Call 911   Complete by: As directed    If you experience chest pain or shortness of breath, CALL 911 and be transported to the hospital emergency room.  If you develope a fever above 101 F, pus (white drainage) or increased drainage or  redness at the wound, or calf pain, call your surgeon's office.   Constipation Prevention   Complete by: As directed    Drink plenty of fluids.  Prune juice may be helpful.  You may use a stool softener, such as Colace (over the counter) 100 mg twice a day.  Use MiraLax  (over the counter) for constipation as needed.   Diet - low sodium heart healthy   Complete by: As directed    Discharge patient   Complete by: As directed    Post PT clearance Meds sent to pharm elec yest from office   Discharge disposition: 01-Home or Self Care   Discharge patient date: 08/04/2023   Driving restrictions   Complete by: As directed    No driving for 2 weeks   Increase activity slowly as tolerated   Complete by: As directed    Patient may shower   Complete by: As directed    You may shower without a dressing once there is no drainage.  Do not wash over the wound.  If drainage remains, cover wound with plastic wrap and then shower.   Post-operative opioid taper instructions:   Complete by: As directed    POST-OPERATIVE OPIOID TAPER INSTRUCTIONS: It is important to wean off of your opioid medication as soon as possible. If you do not need pain medication after your surgery it is ok to stop day one. Opioids include: Codeine, Hydrocodone (Norco, Vicodin), Oxycodone (Percocet, oxycontin ) and hydromorphone  amongst others.  Long term and even short term use of opiods can cause: Increased pain response Dependence Constipation Depression Respiratory depression And more.  Withdrawal symptoms can include Flu like symptoms Nausea, vomiting And more Techniques to manage these symptoms Hydrate well Eat regular healthy meals Stay active Use relaxation techniques(deep breathing, meditating, yoga) Do Not substitute Alcohol to help with tapering If you have been on opioids for less than two weeks and do not have pain than it is ok to stop all together.  Plan to wean off of opioids This plan should start within  one week post op of your joint replacement. Maintain the same interval or time between taking each dose and first decrease the dose.  Cut the total daily intake of opioids by one tablet each day Next start to increase the time between doses. The last dose that should be eliminated is the evening dose.      Weight bearing as tolerated   Complete by: As directed       POD #1 s/p right anterior total hip replacement by Dr. Liam   - Passed PT this morning  - pain medications, muscle relaxants, and DVT prophylaxis medications sent to pharmacy 1/23 from the office and picked up pre-operatively. Refills sent today PRN (Dilaudid  and robaxin , resume plavix  and ASA) - D/c home to [facility] today, family has additional help established -F/U in office 2 weeks   Signed: Ileana JINNY Clara 08/04/2023, 12:18 PM

## 2023-08-04 NOTE — Progress Notes (Addendum)
    Patient doing well PO day 1 S/P ANT R THA by Dr Liam. Patient had a hypotensive episode yesterday and was kept overnight.  It appears he had a bit of confusion overnight and was found walking around his room this morning at 2 AM.  This morning he reports minimal pain and being quite comfortable.  He has not yet had therapy today and been cleared to discharge back home to [facility]. The family has set up additional assistance for him when he discharges.  He is comfortable this morning and reports normal bowel and bladder function and appetite.   Physical Exam: Vitals:   08/04/23 0619 08/04/23 0958  BP: (!) 141/50 (!) 151/50  Pulse: 72 77  Resp: 20 18  Temp: 98.2 F (36.8 C) 97.8 F (36.6 C)  SpO2: 92% 99%    Dressing in place, clean dry and intact, distal compartments are soft.  The patient is laying comfortably in his hospital bed. NVI  POD #1 s/p right anterior total hip replacement by Dr. Liam  - up with PT, encourage ambulation - pain medications, muscle relaxants, and DVT prophylaxis medications sent to pharmacy 1/23 from the office and picked up pre-operatively. Refills sent today PRN (Dilaudid  and robaxin , resume plavix  and ASA) - likely d/c home to [facility] today upon clearance from physical therapywith f/u in 2 weeks

## 2023-08-04 NOTE — Care Management Obs Status (Signed)
 MEDICARE OBSERVATION STATUS NOTIFICATION   Patient Details  Name: Chase Mathis MRN: [id number] Date of Birth: 1933/09/03   Medicare Observation Status Notification Given:  Yes    MahabirThersia Flax, RN 08/04/2023, 9:45 AM

## 2023-08-04 NOTE — TOC Transition Note (Signed)
 Transition of Care [facility]) - Discharge Note   Patient Details  Name: Chase Mathis MRN: [id number] Date of Birth: 06-Feb-1934  [facility] CM/SW Contact:  Bascom Service, RN Phone Number: 08/04/2023, 9:25 AM   Clinical Narrative: Patient already set up by ortho bundle CM on 2/4 see note. Patient called & agreed to dc plan. No further CM needs.      Final next level of care: Home w Home Health Services Barriers to Discharge: No Barriers Identified   Patient Goals and CMS Choice   CMS Medicare.gov Compare Post Acute Care list provided to:: Patient Choice offered to / list presented to : Patient [place] ownership interest in [facility]provided to:: Patient    Discharge Placement                       Discharge Plan and Services Additional resources added to the After Visit Summary for       Post Acute Care Choice: Home Health          DME Arranged: Walker rolling DME Agency: [employer]                  Social Drivers of Health (SDOH) Interventions SDOH Screenings   Food Insecurity: No Food Insecurity (08/04/2023)  Housing: Unknown (08/04/2023)  Transportation Needs: No Transportation Needs (08/04/2023)  Utilities: Not At Risk (08/04/2023)  Social Connections: Unknown (08/04/2023)  Tobacco Use: Low Risk  (08/03/2023)     Readmission Risk Interventions     No data to display

## 2023-08-06 ENCOUNTER — Encounter (HOSPITAL_COMMUNITY): Payer: Self-pay | Admitting: Orthopedic Surgery

## 2023-08-06 DIAGNOSIS — Z96641 Presence of right artificial hip joint: Secondary | ICD-10-CM | POA: Diagnosis not present

## 2023-08-06 DIAGNOSIS — M1611 Unilateral primary osteoarthritis, right hip: Secondary | ICD-10-CM | POA: Diagnosis not present

## 2023-08-06 DIAGNOSIS — M25551 Pain in right hip: Secondary | ICD-10-CM | POA: Diagnosis not present

## 2023-08-06 DIAGNOSIS — R296 Repeated falls: Secondary | ICD-10-CM | POA: Diagnosis not present

## 2023-08-06 DIAGNOSIS — M6259 Muscle wasting and atrophy, not elsewhere classified, multiple sites: Secondary | ICD-10-CM | POA: Diagnosis not present

## 2023-08-06 DIAGNOSIS — R2689 Other abnormalities of gait and mobility: Secondary | ICD-10-CM | POA: Diagnosis not present

## 2023-08-06 DIAGNOSIS — R2681 Unsteadiness on feet: Secondary | ICD-10-CM | POA: Diagnosis not present

## 2023-08-06 NOTE — Anesthesia Postprocedure Evaluation (Signed)
 Anesthesia Post Note  Patient: Chase Mathis  Procedure(s) Performed: RIGHT TOTAL HIP ARTHROPLASTY ANTERIOR APPROACH (Right: Hip)     Patient location during evaluation: PACU Anesthesia Type: Spinal Level of consciousness: oriented and awake and alert Pain management: pain level controlled Vital Signs Assessment: post-procedure vital signs reviewed and stable Respiratory status: spontaneous breathing, respiratory function stable and patient connected to nasal cannula oxygen Cardiovascular status: blood pressure returned to baseline and stable Postop Assessment: no headache, no backache and no apparent nausea or vomiting Anesthetic complications: no   No notable events documented.  Last Vitals:  Vitals:   08/04/23 0619 08/04/23 0958  BP: (!) 141/50 (!) 151/50  Pulse: 72 77  Resp: 20 18  Temp: 36.8 C 36.6 C  SpO2: 92% 99%    Last Pain:  Vitals:   08/04/23 0958  TempSrc: Oral  PainSc:                  Leeanne Butters L Michaline Kindig

## 2023-08-07 DIAGNOSIS — R296 Repeated falls: Secondary | ICD-10-CM | POA: Diagnosis not present

## 2023-08-07 DIAGNOSIS — R2681 Unsteadiness on feet: Secondary | ICD-10-CM | POA: Diagnosis not present

## 2023-08-07 DIAGNOSIS — M1611 Unilateral primary osteoarthritis, right hip: Secondary | ICD-10-CM | POA: Diagnosis not present

## 2023-08-07 DIAGNOSIS — R2689 Other abnormalities of gait and mobility: Secondary | ICD-10-CM | POA: Diagnosis not present

## 2023-08-07 DIAGNOSIS — M25551 Pain in right hip: Secondary | ICD-10-CM | POA: Diagnosis not present

## 2023-08-07 DIAGNOSIS — M6259 Muscle wasting and atrophy, not elsewhere classified, multiple sites: Secondary | ICD-10-CM | POA: Diagnosis not present

## 2023-08-08 DIAGNOSIS — R2681 Unsteadiness on feet: Secondary | ICD-10-CM | POA: Diagnosis not present

## 2023-08-08 DIAGNOSIS — M25551 Pain in right hip: Secondary | ICD-10-CM | POA: Diagnosis not present

## 2023-08-08 DIAGNOSIS — R2689 Other abnormalities of gait and mobility: Secondary | ICD-10-CM | POA: Diagnosis not present

## 2023-08-08 DIAGNOSIS — M1611 Unilateral primary osteoarthritis, right hip: Secondary | ICD-10-CM | POA: Diagnosis not present

## 2023-08-08 DIAGNOSIS — R296 Repeated falls: Secondary | ICD-10-CM | POA: Diagnosis not present

## 2023-08-08 DIAGNOSIS — M6259 Muscle wasting and atrophy, not elsewhere classified, multiple sites: Secondary | ICD-10-CM | POA: Diagnosis not present

## 2023-08-09 ENCOUNTER — Other Ambulatory Visit: Payer: Self-pay

## 2023-08-09 DIAGNOSIS — R296 Repeated falls: Secondary | ICD-10-CM | POA: Diagnosis not present

## 2023-08-09 DIAGNOSIS — R2681 Unsteadiness on feet: Secondary | ICD-10-CM | POA: Diagnosis not present

## 2023-08-09 DIAGNOSIS — I6521 Occlusion and stenosis of right carotid artery: Secondary | ICD-10-CM

## 2023-08-09 DIAGNOSIS — M1611 Unilateral primary osteoarthritis, right hip: Secondary | ICD-10-CM | POA: Diagnosis not present

## 2023-08-09 DIAGNOSIS — M6259 Muscle wasting and atrophy, not elsewhere classified, multiple sites: Secondary | ICD-10-CM | POA: Diagnosis not present

## 2023-08-09 DIAGNOSIS — R2689 Other abnormalities of gait and mobility: Secondary | ICD-10-CM | POA: Diagnosis not present

## 2023-08-09 DIAGNOSIS — M25551 Pain in right hip: Secondary | ICD-10-CM | POA: Diagnosis not present

## 2023-08-14 DIAGNOSIS — R296 Repeated falls: Secondary | ICD-10-CM | POA: Diagnosis not present

## 2023-08-14 DIAGNOSIS — M1611 Unilateral primary osteoarthritis, right hip: Secondary | ICD-10-CM | POA: Diagnosis not present

## 2023-08-14 DIAGNOSIS — R2689 Other abnormalities of gait and mobility: Secondary | ICD-10-CM | POA: Diagnosis not present

## 2023-08-14 DIAGNOSIS — R2681 Unsteadiness on feet: Secondary | ICD-10-CM | POA: Diagnosis not present

## 2023-08-14 DIAGNOSIS — M6259 Muscle wasting and atrophy, not elsewhere classified, multiple sites: Secondary | ICD-10-CM | POA: Diagnosis not present

## 2023-08-14 DIAGNOSIS — M25551 Pain in right hip: Secondary | ICD-10-CM | POA: Diagnosis not present

## 2023-08-15 DIAGNOSIS — M25551 Pain in right hip: Secondary | ICD-10-CM | POA: Diagnosis not present

## 2023-08-15 DIAGNOSIS — R2681 Unsteadiness on feet: Secondary | ICD-10-CM | POA: Diagnosis not present

## 2023-08-15 DIAGNOSIS — M1611 Unilateral primary osteoarthritis, right hip: Secondary | ICD-10-CM | POA: Diagnosis not present

## 2023-08-15 DIAGNOSIS — R2689 Other abnormalities of gait and mobility: Secondary | ICD-10-CM | POA: Diagnosis not present

## 2023-08-15 DIAGNOSIS — R296 Repeated falls: Secondary | ICD-10-CM | POA: Diagnosis not present

## 2023-08-15 DIAGNOSIS — M6259 Muscle wasting and atrophy, not elsewhere classified, multiple sites: Secondary | ICD-10-CM | POA: Diagnosis not present

## 2023-08-17 DIAGNOSIS — M25551 Pain in right hip: Secondary | ICD-10-CM | POA: Diagnosis not present

## 2023-08-17 DIAGNOSIS — R2689 Other abnormalities of gait and mobility: Secondary | ICD-10-CM | POA: Diagnosis not present

## 2023-08-17 DIAGNOSIS — M6259 Muscle wasting and atrophy, not elsewhere classified, multiple sites: Secondary | ICD-10-CM | POA: Diagnosis not present

## 2023-08-17 DIAGNOSIS — R2681 Unsteadiness on feet: Secondary | ICD-10-CM | POA: Diagnosis not present

## 2023-08-17 DIAGNOSIS — M1611 Unilateral primary osteoarthritis, right hip: Secondary | ICD-10-CM | POA: Diagnosis not present

## 2023-08-17 DIAGNOSIS — R296 Repeated falls: Secondary | ICD-10-CM | POA: Diagnosis not present

## 2023-08-20 DIAGNOSIS — M25551 Pain in right hip: Secondary | ICD-10-CM | POA: Diagnosis not present

## 2023-08-20 DIAGNOSIS — M1611 Unilateral primary osteoarthritis, right hip: Secondary | ICD-10-CM | POA: Diagnosis not present

## 2023-08-20 DIAGNOSIS — R2681 Unsteadiness on feet: Secondary | ICD-10-CM | POA: Diagnosis not present

## 2023-08-20 DIAGNOSIS — R296 Repeated falls: Secondary | ICD-10-CM | POA: Diagnosis not present

## 2023-08-20 DIAGNOSIS — R2689 Other abnormalities of gait and mobility: Secondary | ICD-10-CM | POA: Diagnosis not present

## 2023-08-20 DIAGNOSIS — M6259 Muscle wasting and atrophy, not elsewhere classified, multiple sites: Secondary | ICD-10-CM | POA: Diagnosis not present

## 2023-08-21 DIAGNOSIS — M1611 Unilateral primary osteoarthritis, right hip: Secondary | ICD-10-CM | POA: Diagnosis not present

## 2023-08-22 DIAGNOSIS — M25551 Pain in right hip: Secondary | ICD-10-CM | POA: Diagnosis not present

## 2023-08-22 DIAGNOSIS — R296 Repeated falls: Secondary | ICD-10-CM | POA: Diagnosis not present

## 2023-08-22 DIAGNOSIS — M1611 Unilateral primary osteoarthritis, right hip: Secondary | ICD-10-CM | POA: Diagnosis not present

## 2023-08-22 DIAGNOSIS — R2689 Other abnormalities of gait and mobility: Secondary | ICD-10-CM | POA: Diagnosis not present

## 2023-08-22 DIAGNOSIS — M6259 Muscle wasting and atrophy, not elsewhere classified, multiple sites: Secondary | ICD-10-CM | POA: Diagnosis not present

## 2023-08-22 DIAGNOSIS — R2681 Unsteadiness on feet: Secondary | ICD-10-CM | POA: Diagnosis not present

## 2023-08-23 DIAGNOSIS — R2681 Unsteadiness on feet: Secondary | ICD-10-CM | POA: Diagnosis not present

## 2023-08-23 DIAGNOSIS — M1611 Unilateral primary osteoarthritis, right hip: Secondary | ICD-10-CM | POA: Diagnosis not present

## 2023-08-23 DIAGNOSIS — R2689 Other abnormalities of gait and mobility: Secondary | ICD-10-CM | POA: Diagnosis not present

## 2023-08-23 DIAGNOSIS — R296 Repeated falls: Secondary | ICD-10-CM | POA: Diagnosis not present

## 2023-08-23 DIAGNOSIS — M6259 Muscle wasting and atrophy, not elsewhere classified, multiple sites: Secondary | ICD-10-CM | POA: Diagnosis not present

## 2023-08-23 DIAGNOSIS — M25551 Pain in right hip: Secondary | ICD-10-CM | POA: Diagnosis not present

## 2023-08-24 DIAGNOSIS — R296 Repeated falls: Secondary | ICD-10-CM | POA: Diagnosis not present

## 2023-08-24 DIAGNOSIS — R2681 Unsteadiness on feet: Secondary | ICD-10-CM | POA: Diagnosis not present

## 2023-08-24 DIAGNOSIS — M6259 Muscle wasting and atrophy, not elsewhere classified, multiple sites: Secondary | ICD-10-CM | POA: Diagnosis not present

## 2023-08-24 DIAGNOSIS — R2689 Other abnormalities of gait and mobility: Secondary | ICD-10-CM | POA: Diagnosis not present

## 2023-08-24 DIAGNOSIS — M1611 Unilateral primary osteoarthritis, right hip: Secondary | ICD-10-CM | POA: Diagnosis not present

## 2023-08-24 DIAGNOSIS — M25551 Pain in right hip: Secondary | ICD-10-CM | POA: Diagnosis not present

## 2023-08-30 ENCOUNTER — Other Ambulatory Visit: Payer: Self-pay | Admitting: Psychiatry

## 2023-08-30 DIAGNOSIS — F3342 Major depressive disorder, recurrent, in full remission: Secondary | ICD-10-CM

## 2023-09-03 DIAGNOSIS — R296 Repeated falls: Secondary | ICD-10-CM | POA: Diagnosis not present

## 2023-09-03 DIAGNOSIS — Z96641 Presence of right artificial hip joint: Secondary | ICD-10-CM | POA: Diagnosis not present

## 2023-09-03 DIAGNOSIS — M1611 Unilateral primary osteoarthritis, right hip: Secondary | ICD-10-CM | POA: Diagnosis not present

## 2023-09-03 DIAGNOSIS — M25551 Pain in right hip: Secondary | ICD-10-CM | POA: Diagnosis not present

## 2023-09-03 DIAGNOSIS — M6259 Muscle wasting and atrophy, not elsewhere classified, multiple sites: Secondary | ICD-10-CM | POA: Diagnosis not present

## 2023-09-03 DIAGNOSIS — R2689 Other abnormalities of gait and mobility: Secondary | ICD-10-CM | POA: Diagnosis not present

## 2023-09-03 DIAGNOSIS — R2681 Unsteadiness on feet: Secondary | ICD-10-CM | POA: Diagnosis not present

## 2023-09-04 DIAGNOSIS — M25551 Pain in right hip: Secondary | ICD-10-CM | POA: Diagnosis not present

## 2023-09-04 DIAGNOSIS — R2681 Unsteadiness on feet: Secondary | ICD-10-CM | POA: Diagnosis not present

## 2023-09-04 DIAGNOSIS — R2689 Other abnormalities of gait and mobility: Secondary | ICD-10-CM | POA: Diagnosis not present

## 2023-09-04 DIAGNOSIS — R296 Repeated falls: Secondary | ICD-10-CM | POA: Diagnosis not present

## 2023-09-04 DIAGNOSIS — M1611 Unilateral primary osteoarthritis, right hip: Secondary | ICD-10-CM | POA: Diagnosis not present

## 2023-09-04 DIAGNOSIS — M6259 Muscle wasting and atrophy, not elsewhere classified, multiple sites: Secondary | ICD-10-CM | POA: Diagnosis not present

## 2023-09-05 DIAGNOSIS — R2689 Other abnormalities of gait and mobility: Secondary | ICD-10-CM | POA: Diagnosis not present

## 2023-09-05 DIAGNOSIS — M25551 Pain in right hip: Secondary | ICD-10-CM | POA: Diagnosis not present

## 2023-09-05 DIAGNOSIS — M1611 Unilateral primary osteoarthritis, right hip: Secondary | ICD-10-CM | POA: Diagnosis not present

## 2023-09-05 DIAGNOSIS — R2681 Unsteadiness on feet: Secondary | ICD-10-CM | POA: Diagnosis not present

## 2023-09-05 DIAGNOSIS — M6259 Muscle wasting and atrophy, not elsewhere classified, multiple sites: Secondary | ICD-10-CM | POA: Diagnosis not present

## 2023-09-05 DIAGNOSIS — R296 Repeated falls: Secondary | ICD-10-CM | POA: Diagnosis not present

## 2023-09-11 ENCOUNTER — Other Ambulatory Visit: Payer: Self-pay | Admitting: Psychiatry

## 2023-09-11 DIAGNOSIS — F3342 Major depressive disorder, recurrent, in full remission: Secondary | ICD-10-CM

## 2023-09-12 DIAGNOSIS — E46 Unspecified protein-calorie malnutrition: Secondary | ICD-10-CM | POA: Diagnosis not present

## 2023-09-12 DIAGNOSIS — R058 Other specified cough: Secondary | ICD-10-CM | POA: Diagnosis not present

## 2023-09-12 DIAGNOSIS — Z8673 Personal history of transient ischemic attack (TIA), and cerebral infarction without residual deficits: Secondary | ICD-10-CM | POA: Diagnosis not present

## 2023-09-13 DIAGNOSIS — R058 Other specified cough: Secondary | ICD-10-CM | POA: Diagnosis not present

## 2023-09-14 DIAGNOSIS — M6259 Muscle wasting and atrophy, not elsewhere classified, multiple sites: Secondary | ICD-10-CM | POA: Diagnosis not present

## 2023-09-14 DIAGNOSIS — R2689 Other abnormalities of gait and mobility: Secondary | ICD-10-CM | POA: Diagnosis not present

## 2023-09-14 DIAGNOSIS — R2681 Unsteadiness on feet: Secondary | ICD-10-CM | POA: Diagnosis not present

## 2023-09-14 DIAGNOSIS — M1611 Unilateral primary osteoarthritis, right hip: Secondary | ICD-10-CM | POA: Diagnosis not present

## 2023-09-14 DIAGNOSIS — R296 Repeated falls: Secondary | ICD-10-CM | POA: Diagnosis not present

## 2023-09-14 DIAGNOSIS — M25551 Pain in right hip: Secondary | ICD-10-CM | POA: Diagnosis not present

## 2023-09-18 DIAGNOSIS — M25551 Pain in right hip: Secondary | ICD-10-CM | POA: Diagnosis not present

## 2023-09-18 DIAGNOSIS — M1611 Unilateral primary osteoarthritis, right hip: Secondary | ICD-10-CM | POA: Diagnosis not present

## 2023-09-18 DIAGNOSIS — R2689 Other abnormalities of gait and mobility: Secondary | ICD-10-CM | POA: Diagnosis not present

## 2023-09-18 DIAGNOSIS — M6259 Muscle wasting and atrophy, not elsewhere classified, multiple sites: Secondary | ICD-10-CM | POA: Diagnosis not present

## 2023-09-18 DIAGNOSIS — R2681 Unsteadiness on feet: Secondary | ICD-10-CM | POA: Diagnosis not present

## 2023-09-18 DIAGNOSIS — R296 Repeated falls: Secondary | ICD-10-CM | POA: Diagnosis not present

## 2023-09-20 DIAGNOSIS — R2681 Unsteadiness on feet: Secondary | ICD-10-CM | POA: Diagnosis not present

## 2023-09-20 DIAGNOSIS — R296 Repeated falls: Secondary | ICD-10-CM | POA: Diagnosis not present

## 2023-09-20 DIAGNOSIS — R2689 Other abnormalities of gait and mobility: Secondary | ICD-10-CM | POA: Diagnosis not present

## 2023-09-20 DIAGNOSIS — M6259 Muscle wasting and atrophy, not elsewhere classified, multiple sites: Secondary | ICD-10-CM | POA: Diagnosis not present

## 2023-09-20 DIAGNOSIS — M25551 Pain in right hip: Secondary | ICD-10-CM | POA: Diagnosis not present

## 2023-09-20 DIAGNOSIS — M1611 Unilateral primary osteoarthritis, right hip: Secondary | ICD-10-CM | POA: Diagnosis not present

## 2023-09-21 DIAGNOSIS — R2681 Unsteadiness on feet: Secondary | ICD-10-CM | POA: Diagnosis not present

## 2023-09-21 DIAGNOSIS — R296 Repeated falls: Secondary | ICD-10-CM | POA: Diagnosis not present

## 2023-09-21 DIAGNOSIS — M25551 Pain in right hip: Secondary | ICD-10-CM | POA: Diagnosis not present

## 2023-09-21 DIAGNOSIS — M6259 Muscle wasting and atrophy, not elsewhere classified, multiple sites: Secondary | ICD-10-CM | POA: Diagnosis not present

## 2023-09-21 DIAGNOSIS — R2689 Other abnormalities of gait and mobility: Secondary | ICD-10-CM | POA: Diagnosis not present

## 2023-09-21 DIAGNOSIS — M1611 Unilateral primary osteoarthritis, right hip: Secondary | ICD-10-CM | POA: Diagnosis not present

## 2023-09-25 DIAGNOSIS — M1611 Unilateral primary osteoarthritis, right hip: Secondary | ICD-10-CM | POA: Diagnosis not present

## 2023-09-25 DIAGNOSIS — R2689 Other abnormalities of gait and mobility: Secondary | ICD-10-CM | POA: Diagnosis not present

## 2023-09-25 DIAGNOSIS — R2681 Unsteadiness on feet: Secondary | ICD-10-CM | POA: Diagnosis not present

## 2023-09-25 DIAGNOSIS — M6259 Muscle wasting and atrophy, not elsewhere classified, multiple sites: Secondary | ICD-10-CM | POA: Diagnosis not present

## 2023-09-25 DIAGNOSIS — M25551 Pain in right hip: Secondary | ICD-10-CM | POA: Diagnosis not present

## 2023-09-25 DIAGNOSIS — Z96641 Presence of right artificial hip joint: Secondary | ICD-10-CM | POA: Diagnosis not present

## 2023-09-27 ENCOUNTER — Telehealth: Payer: Self-pay | Admitting: Neurology

## 2023-09-27 NOTE — Telephone Encounter (Signed)
 Pts Daughter Elita Quick) Longterm Care Rx help was denied, Dr. Arbutus Leas mentioned that she would do another eval to be able to resubmit for the Rx help with LongTerm Care, would like to know if this Eval will be happening on the next appt sched

## 2023-09-28 NOTE — Telephone Encounter (Signed)
 Called and spoke to patients daughter and they are coming on Thursday to discuss testing . I asked them to come in early for [facility]

## 2023-10-01 DIAGNOSIS — Z96641 Presence of right artificial hip joint: Secondary | ICD-10-CM | POA: Diagnosis not present

## 2023-10-01 DIAGNOSIS — R2689 Other abnormalities of gait and mobility: Secondary | ICD-10-CM | POA: Diagnosis not present

## 2023-10-01 DIAGNOSIS — M6259 Muscle wasting and atrophy, not elsewhere classified, multiple sites: Secondary | ICD-10-CM | POA: Diagnosis not present

## 2023-10-01 DIAGNOSIS — M25551 Pain in right hip: Secondary | ICD-10-CM | POA: Diagnosis not present

## 2023-10-01 DIAGNOSIS — R2681 Unsteadiness on feet: Secondary | ICD-10-CM | POA: Diagnosis not present

## 2023-10-01 DIAGNOSIS — M1611 Unilateral primary osteoarthritis, right hip: Secondary | ICD-10-CM | POA: Diagnosis not present

## 2023-10-03 DIAGNOSIS — R2689 Other abnormalities of gait and mobility: Secondary | ICD-10-CM | POA: Diagnosis not present

## 2023-10-03 DIAGNOSIS — M6259 Muscle wasting and atrophy, not elsewhere classified, multiple sites: Secondary | ICD-10-CM | POA: Diagnosis not present

## 2023-10-03 DIAGNOSIS — M1611 Unilateral primary osteoarthritis, right hip: Secondary | ICD-10-CM | POA: Diagnosis not present

## 2023-10-03 DIAGNOSIS — Z96641 Presence of right artificial hip joint: Secondary | ICD-10-CM | POA: Diagnosis not present

## 2023-10-03 DIAGNOSIS — M25551 Pain in right hip: Secondary | ICD-10-CM | POA: Diagnosis not present

## 2023-10-03 DIAGNOSIS — R2681 Unsteadiness on feet: Secondary | ICD-10-CM | POA: Diagnosis not present

## 2023-10-03 NOTE — Progress Notes (Unsigned)
 Assessment/Plan:   1.  Gait instabity  -Peripheral neuropathy likely contributes.  He is on a B12 supplement.             -Patient is also on nortriptyline, which is an anticholinergic and certainly can contribute to gait instability, but he reports its needed for control of his depression  -Skin biopsy negative for alpha-synuclein in August, 2023  -skin bx was pos for PN which could produce gait instability  -DaTscan did demonstrate decreased uptake in the left caudate more than the right and mild decreased activity in the left posterior stratum.  -Patient's case is very complex, partially because the presence of tremor inducing agents, and because of the fact that his skin biopsy has been negative and DaTscan has been marginally positive.  Clinically, I do not think that the patient has idiopathic Parkinsons Disease.    -MRI brain done in February, 2024 with evidence of mild small vessel disease only.  -recommend walker at all times.  He is more unstable than in the past and I worry about falls/consequence of them.  Discussed this today in detail   2.  B12 deficiency             -Patient now on supplementation.   3.  Tremor             -This is likely due to the lithium, and we discussed that again today.  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 This has been fairly stable over the years   4.  Memory change  -Neurocognitive testing done January 04, 2021 with evidence of MCI only.  Felt that this could be due to medication (nortriptyline, lithium).  This may have progressed with time.  -son in law states that he had what sounded like neuropsych testing recently.  It sounds like they did this to see if he would qualify for medication management at [facility] and he was told that he would not because he only had mild cognitive impairment.  Based on his evaluation today, that surprised me a  bit, as we had talked about doing repeating his neurocognitive testing, which is how this conversation arose.  His son-in-law is going to try to get me a copy of that, but I still think that it may behoove [place] to repeat that.  The patient even had difficulty remembering the TIA that he recently had when I first walked into the room, which should be an event that he should be able to easily remember.  -***We discussed neurocognitive testing.  I believe he had this done elsewhere within the last year, so I discussed with him that he may need to pay out-of-pocket for the testing.  We can certainly call his insurance and do a prior authorization if they want.  -***Nortriptyline and ativan can contribute to some degree of memory change, but he is unable to get off of this medication per psychiatry  5.  Carotid stenosis, left  -Was 80% via CTA and less than 50% via carotid ultrasound.  He is following with vascular surgery and they felt that the ultrasound was more accurate.  There is certainly a significant discrepancy between the 2 and generally CTA is thought to be more sensitive, but nonetheless the patient is closely following with vascular surgery.  Patient is currently on baby aspirin only.    -Patient also with severe left vertebral stenosis  -  Findings above were incidental and not associated with TIA symptoms.  6.  TIA  -Occurred October, 2024  -Symptoms consisted of speech and balance change  -MRI was negative  - On aspirin monotherapy  -Patient on Crestor, 20 mg daily.      Subjective:   Chase Mathis was seen today in neurologic follow-up.  Pt with son in law, Loraine Leriche, who supplements the history.  Records reviewed since last visit.  I last saw the patient in January.  At that point in time, he was trying to qualify for med management at [facility], but did not see had what sounded like neurocognitive testing elsewhere and was told he did not qualify based on that.  That had surprised me and  I told him that we may need to repeat that in the future.  They really would like to tap into some of his long-term care insurance and need more definitive testing.  Separately, the patient recently did have total right hip arthroplasty on February 7.  Notes are reviewed.  Current medication: Carbidopa/levodopa 25/100, 1 tablet 3 times per day  ALLERGIES:  No Known Allergies  CURRENT MEDICATIONS:  Outpatient Encounter Medications as of 10/04/2023  Medication Sig   amLODipine (NORVASC) 5 MG tablet Take 5 mg by mouth daily.   aspirin 81 MG chewable tablet Chew 1 tablet (81 mg total) by mouth daily.   clopidogrel (PLAVIX) 75 MG tablet Take 1 tablet (75 mg total) by mouth daily.   finasteride (PROSCAR) 5 MG tablet Take 1 tablet (5 mg total) by mouth daily.   lisinopril (PRINIVIL,ZESTRIL) 10 MG tablet Take 10 mg by mouth daily.   lithium carbonate 150 MG capsule TAKE THREE CAPSULES AT BEDTIME   LORazepam (ATIVAN) 0.5 MG tablet TAKE ONE TABLET AT BEDTIME AS NEEDED FOR ANXIETY   nortriptyline (PAMELOR) 50 MG capsule TAKE TWO CAPSULES AT BEDTIME   rosuvastatin (CRESTOR) 20 MG tablet Take 1 tablet (20 mg total) by mouth at bedtime.   No facility-administered encounter medications on file as of 10/04/2023.    Objective:   PHYSICAL EXAMINATION:    VITALS:   There were no vitals filed for this visit.     GEN:  Normal appears male in no acute distress.  Appears younger than stated age. HEENT:  Normocephalic, atraumatic. The mucous membranes are moist.  Cardiovascular: Regular rate rhythm Lungs: Clear to auscultation bilaterally  NEUROLOGICAL: Orientation:      No data to display          Cranial nerves: There is good facial symmetry.  Extraocular muscles are intact. The visual fields are full to confrontational testing. The speech is fluent and clear. Soft palate rises symmetrically and there is no tongue deviation. Hearing is intact to conversational tone. Sensation: Sensation is  intact to light touch throughout. Motor: Strength is 5/5 in the bilateral upper and lower extremities.   Movement examination: Tone: There is nl tone today Abnormal movements: There is rare rest tremor in the left upper extremity. Coordination:  There is no significant decremation with any form of RAMS, including alternating supination and pronation of the forearm, hand opening and closing, finger taps, heel taps and toe taps.  Gait and Station: The patient pushes off to arise.  He is unsteady.  He is short stepped   Chemistry      Component Value Date/Time   NA 136 08/02/2023 0925   NA 143 06/22/2022 0849   K 4.7 08/02/2023 0925   CL 104 08/02/2023  0925   CO2 23 08/02/2023 0925   BUN 18 08/02/2023 0925   BUN 19 06/22/2022 0849   CREATININE 1.07 08/02/2023 0925   CREATININE 1.18 (H) 04/18/2018 0937      Component Value Date/Time   CALCIUM 9.1 08/02/2023 0925   ALKPHOS 55 04/20/2023 1606   AST 19 04/20/2023 1606   ALT 20 04/20/2023 1606   BILITOT 0.7 04/20/2023 1606     Lab Results  Component Value Date   VITAMINB12 >2000 (H) 09/16/2021    Total time spent on today's visit was *** minutes, including both face-to-face time and nonface-to-face time.  Time included that spent on review of records (prior notes available to me/labs/imaging if pertinent), discussing treatment and goals, answering patient's questions and coordinating care.    Cc:  Thana Ates, MD

## 2023-10-04 ENCOUNTER — Ambulatory Visit (INDEPENDENT_AMBULATORY_CARE_PROVIDER_SITE_OTHER): Admitting: Neurology

## 2023-10-04 ENCOUNTER — Ambulatory Visit

## 2023-10-04 VITALS — BP 124/74 | HR 86 | Ht 68.0 in | Wt 204.8 lb

## 2023-10-04 DIAGNOSIS — R2681 Unsteadiness on feet: Secondary | ICD-10-CM | POA: Diagnosis not present

## 2023-10-04 DIAGNOSIS — G301 Alzheimer's disease with late onset: Secondary | ICD-10-CM

## 2023-10-04 DIAGNOSIS — F02B3 Dementia in other diseases classified elsewhere, moderate, with mood disturbance: Secondary | ICD-10-CM

## 2023-10-08 DIAGNOSIS — M6259 Muscle wasting and atrophy, not elsewhere classified, multiple sites: Secondary | ICD-10-CM | POA: Diagnosis not present

## 2023-10-08 DIAGNOSIS — Z96641 Presence of right artificial hip joint: Secondary | ICD-10-CM | POA: Diagnosis not present

## 2023-10-08 DIAGNOSIS — R2681 Unsteadiness on feet: Secondary | ICD-10-CM | POA: Diagnosis not present

## 2023-10-08 DIAGNOSIS — M25551 Pain in right hip: Secondary | ICD-10-CM | POA: Diagnosis not present

## 2023-10-08 DIAGNOSIS — R2689 Other abnormalities of gait and mobility: Secondary | ICD-10-CM | POA: Diagnosis not present

## 2023-10-08 DIAGNOSIS — M1611 Unilateral primary osteoarthritis, right hip: Secondary | ICD-10-CM | POA: Diagnosis not present

## 2023-10-09 DIAGNOSIS — R2681 Unsteadiness on feet: Secondary | ICD-10-CM | POA: Diagnosis not present

## 2023-10-09 DIAGNOSIS — R2689 Other abnormalities of gait and mobility: Secondary | ICD-10-CM | POA: Diagnosis not present

## 2023-10-09 DIAGNOSIS — Z96641 Presence of right artificial hip joint: Secondary | ICD-10-CM | POA: Diagnosis not present

## 2023-10-09 DIAGNOSIS — M25551 Pain in right hip: Secondary | ICD-10-CM | POA: Diagnosis not present

## 2023-10-09 DIAGNOSIS — M1611 Unilateral primary osteoarthritis, right hip: Secondary | ICD-10-CM | POA: Diagnosis not present

## 2023-10-09 DIAGNOSIS — M6259 Muscle wasting and atrophy, not elsewhere classified, multiple sites: Secondary | ICD-10-CM | POA: Diagnosis not present

## 2023-10-16 DIAGNOSIS — R2681 Unsteadiness on feet: Secondary | ICD-10-CM | POA: Diagnosis not present

## 2023-10-16 DIAGNOSIS — M6259 Muscle wasting and atrophy, not elsewhere classified, multiple sites: Secondary | ICD-10-CM | POA: Diagnosis not present

## 2023-10-16 DIAGNOSIS — R2689 Other abnormalities of gait and mobility: Secondary | ICD-10-CM | POA: Diagnosis not present

## 2023-10-16 DIAGNOSIS — M25551 Pain in right hip: Secondary | ICD-10-CM | POA: Diagnosis not present

## 2023-10-16 DIAGNOSIS — M1611 Unilateral primary osteoarthritis, right hip: Secondary | ICD-10-CM | POA: Diagnosis not present

## 2023-10-16 DIAGNOSIS — Z96641 Presence of right artificial hip joint: Secondary | ICD-10-CM | POA: Diagnosis not present

## 2023-10-18 DIAGNOSIS — M25551 Pain in right hip: Secondary | ICD-10-CM | POA: Diagnosis not present

## 2023-10-18 DIAGNOSIS — Z96641 Presence of right artificial hip joint: Secondary | ICD-10-CM | POA: Diagnosis not present

## 2023-10-18 DIAGNOSIS — R2689 Other abnormalities of gait and mobility: Secondary | ICD-10-CM | POA: Diagnosis not present

## 2023-10-18 DIAGNOSIS — R2681 Unsteadiness on feet: Secondary | ICD-10-CM | POA: Diagnosis not present

## 2023-10-18 DIAGNOSIS — M6259 Muscle wasting and atrophy, not elsewhere classified, multiple sites: Secondary | ICD-10-CM | POA: Diagnosis not present

## 2023-10-18 DIAGNOSIS — M1611 Unilateral primary osteoarthritis, right hip: Secondary | ICD-10-CM | POA: Diagnosis not present

## 2023-10-19 DIAGNOSIS — R2681 Unsteadiness on feet: Secondary | ICD-10-CM | POA: Diagnosis not present

## 2023-10-19 DIAGNOSIS — M6259 Muscle wasting and atrophy, not elsewhere classified, multiple sites: Secondary | ICD-10-CM | POA: Diagnosis not present

## 2023-10-19 DIAGNOSIS — Z96641 Presence of right artificial hip joint: Secondary | ICD-10-CM | POA: Diagnosis not present

## 2023-10-19 DIAGNOSIS — M25551 Pain in right hip: Secondary | ICD-10-CM | POA: Diagnosis not present

## 2023-10-19 DIAGNOSIS — M1611 Unilateral primary osteoarthritis, right hip: Secondary | ICD-10-CM | POA: Diagnosis not present

## 2023-10-19 DIAGNOSIS — R2689 Other abnormalities of gait and mobility: Secondary | ICD-10-CM | POA: Diagnosis not present

## 2023-10-23 DIAGNOSIS — R2681 Unsteadiness on feet: Secondary | ICD-10-CM | POA: Diagnosis not present

## 2023-10-23 DIAGNOSIS — M25551 Pain in right hip: Secondary | ICD-10-CM | POA: Diagnosis not present

## 2023-10-23 DIAGNOSIS — R2689 Other abnormalities of gait and mobility: Secondary | ICD-10-CM | POA: Diagnosis not present

## 2023-10-23 DIAGNOSIS — Z96641 Presence of right artificial hip joint: Secondary | ICD-10-CM | POA: Diagnosis not present

## 2023-10-23 DIAGNOSIS — M1611 Unilateral primary osteoarthritis, right hip: Secondary | ICD-10-CM | POA: Diagnosis not present

## 2023-10-23 DIAGNOSIS — M6259 Muscle wasting and atrophy, not elsewhere classified, multiple sites: Secondary | ICD-10-CM | POA: Diagnosis not present

## 2023-10-24 DIAGNOSIS — Z96641 Presence of right artificial hip joint: Secondary | ICD-10-CM | POA: Diagnosis not present

## 2023-10-24 DIAGNOSIS — M25551 Pain in right hip: Secondary | ICD-10-CM | POA: Diagnosis not present

## 2023-10-24 DIAGNOSIS — M1611 Unilateral primary osteoarthritis, right hip: Secondary | ICD-10-CM | POA: Diagnosis not present

## 2023-10-24 DIAGNOSIS — R2681 Unsteadiness on feet: Secondary | ICD-10-CM | POA: Diagnosis not present

## 2023-10-24 DIAGNOSIS — M6259 Muscle wasting and atrophy, not elsewhere classified, multiple sites: Secondary | ICD-10-CM | POA: Diagnosis not present

## 2023-10-24 DIAGNOSIS — R2689 Other abnormalities of gait and mobility: Secondary | ICD-10-CM | POA: Diagnosis not present

## 2023-10-25 DIAGNOSIS — M25551 Pain in right hip: Secondary | ICD-10-CM | POA: Diagnosis not present

## 2023-10-25 DIAGNOSIS — R2681 Unsteadiness on feet: Secondary | ICD-10-CM | POA: Diagnosis not present

## 2023-10-25 DIAGNOSIS — Z96641 Presence of right artificial hip joint: Secondary | ICD-10-CM | POA: Diagnosis not present

## 2023-10-25 DIAGNOSIS — M1611 Unilateral primary osteoarthritis, right hip: Secondary | ICD-10-CM | POA: Diagnosis not present

## 2023-10-25 DIAGNOSIS — M6259 Muscle wasting and atrophy, not elsewhere classified, multiple sites: Secondary | ICD-10-CM | POA: Diagnosis not present

## 2023-10-25 DIAGNOSIS — R2689 Other abnormalities of gait and mobility: Secondary | ICD-10-CM | POA: Diagnosis not present

## 2023-10-29 DIAGNOSIS — R2681 Unsteadiness on feet: Secondary | ICD-10-CM | POA: Diagnosis not present

## 2023-10-29 DIAGNOSIS — M25551 Pain in right hip: Secondary | ICD-10-CM | POA: Diagnosis not present

## 2023-10-29 DIAGNOSIS — Z96641 Presence of right artificial hip joint: Secondary | ICD-10-CM | POA: Diagnosis not present

## 2023-10-29 DIAGNOSIS — R2689 Other abnormalities of gait and mobility: Secondary | ICD-10-CM | POA: Diagnosis not present

## 2023-10-29 DIAGNOSIS — M1611 Unilateral primary osteoarthritis, right hip: Secondary | ICD-10-CM | POA: Diagnosis not present

## 2023-10-29 DIAGNOSIS — M6259 Muscle wasting and atrophy, not elsewhere classified, multiple sites: Secondary | ICD-10-CM | POA: Diagnosis not present

## 2023-10-30 DIAGNOSIS — M6259 Muscle wasting and atrophy, not elsewhere classified, multiple sites: Secondary | ICD-10-CM | POA: Diagnosis not present

## 2023-10-30 DIAGNOSIS — M25551 Pain in right hip: Secondary | ICD-10-CM | POA: Diagnosis not present

## 2023-10-30 DIAGNOSIS — R2681 Unsteadiness on feet: Secondary | ICD-10-CM | POA: Diagnosis not present

## 2023-10-30 DIAGNOSIS — M1611 Unilateral primary osteoarthritis, right hip: Secondary | ICD-10-CM | POA: Diagnosis not present

## 2023-10-30 DIAGNOSIS — R2689 Other abnormalities of gait and mobility: Secondary | ICD-10-CM | POA: Diagnosis not present

## 2023-10-30 DIAGNOSIS — Z96641 Presence of right artificial hip joint: Secondary | ICD-10-CM | POA: Diagnosis not present

## 2023-11-01 DIAGNOSIS — M1611 Unilateral primary osteoarthritis, right hip: Secondary | ICD-10-CM | POA: Diagnosis not present

## 2023-11-01 DIAGNOSIS — M25551 Pain in right hip: Secondary | ICD-10-CM | POA: Diagnosis not present

## 2023-11-01 DIAGNOSIS — R2681 Unsteadiness on feet: Secondary | ICD-10-CM | POA: Diagnosis not present

## 2023-11-01 DIAGNOSIS — M6259 Muscle wasting and atrophy, not elsewhere classified, multiple sites: Secondary | ICD-10-CM | POA: Diagnosis not present

## 2023-11-01 DIAGNOSIS — R2689 Other abnormalities of gait and mobility: Secondary | ICD-10-CM | POA: Diagnosis not present

## 2023-11-01 DIAGNOSIS — Z96641 Presence of right artificial hip joint: Secondary | ICD-10-CM | POA: Diagnosis not present

## 2023-11-06 DIAGNOSIS — R2681 Unsteadiness on feet: Secondary | ICD-10-CM | POA: Diagnosis not present

## 2023-11-06 DIAGNOSIS — M25551 Pain in right hip: Secondary | ICD-10-CM | POA: Diagnosis not present

## 2023-11-06 DIAGNOSIS — M6259 Muscle wasting and atrophy, not elsewhere classified, multiple sites: Secondary | ICD-10-CM | POA: Diagnosis not present

## 2023-11-06 DIAGNOSIS — Z96641 Presence of right artificial hip joint: Secondary | ICD-10-CM | POA: Diagnosis not present

## 2023-11-06 DIAGNOSIS — M1611 Unilateral primary osteoarthritis, right hip: Secondary | ICD-10-CM | POA: Diagnosis not present

## 2023-11-06 DIAGNOSIS — R2689 Other abnormalities of gait and mobility: Secondary | ICD-10-CM | POA: Diagnosis not present

## 2023-11-08 ENCOUNTER — Ambulatory Visit: Admitting: Neurology

## 2023-11-08 DIAGNOSIS — M1611 Unilateral primary osteoarthritis, right hip: Secondary | ICD-10-CM | POA: Diagnosis not present

## 2023-11-08 DIAGNOSIS — R2681 Unsteadiness on feet: Secondary | ICD-10-CM | POA: Diagnosis not present

## 2023-11-08 DIAGNOSIS — M25551 Pain in right hip: Secondary | ICD-10-CM | POA: Diagnosis not present

## 2023-11-08 DIAGNOSIS — Z96641 Presence of right artificial hip joint: Secondary | ICD-10-CM | POA: Diagnosis not present

## 2023-11-08 DIAGNOSIS — R2689 Other abnormalities of gait and mobility: Secondary | ICD-10-CM | POA: Diagnosis not present

## 2023-11-08 DIAGNOSIS — M6259 Muscle wasting and atrophy, not elsewhere classified, multiple sites: Secondary | ICD-10-CM | POA: Diagnosis not present

## 2023-11-14 ENCOUNTER — Ambulatory Visit (INDEPENDENT_AMBULATORY_CARE_PROVIDER_SITE_OTHER): Payer: Medicare Other | Admitting: Psychiatry

## 2023-11-14 ENCOUNTER — Encounter: Payer: Self-pay | Admitting: Psychiatry

## 2023-11-14 DIAGNOSIS — G301 Alzheimer's disease with late onset: Secondary | ICD-10-CM

## 2023-11-14 DIAGNOSIS — F5105 Insomnia due to other mental disorder: Secondary | ICD-10-CM

## 2023-11-14 DIAGNOSIS — F02B3 Dementia in other diseases classified elsewhere, moderate, with mood disturbance: Secondary | ICD-10-CM

## 2023-11-14 DIAGNOSIS — E538 Deficiency of other specified B group vitamins: Secondary | ICD-10-CM | POA: Diagnosis not present

## 2023-11-14 DIAGNOSIS — F339 Major depressive disorder, recurrent, unspecified: Secondary | ICD-10-CM | POA: Diagnosis not present

## 2023-11-14 DIAGNOSIS — Z79899 Other long term (current) drug therapy: Secondary | ICD-10-CM | POA: Diagnosis not present

## 2023-11-14 DIAGNOSIS — F3342 Major depressive disorder, recurrent, in full remission: Secondary | ICD-10-CM

## 2023-11-14 DIAGNOSIS — G251 Drug-induced tremor: Secondary | ICD-10-CM | POA: Diagnosis not present

## 2023-11-14 MED ORDER — LORAZEPAM 0.5 MG PO TABS: 0.5000 mg | ORAL_TABLET | Freq: Three times a day (TID) | ORAL | 3 refills | Status: DC | PRN

## 2023-11-14 MED ORDER — LITHIUM CARBONATE 150 MG PO CAPS: ORAL_CAPSULE | ORAL | 1 refills | Status: DC

## 2023-11-14 MED ORDER — NORTRIPTYLINE HCL 50 MG PO CAPS: 100.0000 mg | ORAL_CAPSULE | Freq: Every day | ORAL | 1 refills | Status: DC
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
## 2023-11-20 DIAGNOSIS — R2681 Unsteadiness on feet: Secondary | ICD-10-CM | POA: Diagnosis not present

## 2023-11-20 DIAGNOSIS — Z96641 Presence of right artificial hip joint: Secondary | ICD-10-CM | POA: Diagnosis not present

## 2023-11-20 DIAGNOSIS — R2689 Other abnormalities of gait and mobility: Secondary | ICD-10-CM | POA: Diagnosis not present

## 2023-11-20 DIAGNOSIS — M1611 Unilateral primary osteoarthritis, right hip: Secondary | ICD-10-CM | POA: Diagnosis not present

## 2023-11-20 DIAGNOSIS — M25551 Pain in right hip: Secondary | ICD-10-CM | POA: Diagnosis not present

## 2023-11-20 DIAGNOSIS — M6259 Muscle wasting and atrophy, not elsewhere classified, multiple sites: Secondary | ICD-10-CM | POA: Diagnosis not present

## 2023-11-23 DIAGNOSIS — M1611 Unilateral primary osteoarthritis, right hip: Secondary | ICD-10-CM | POA: Diagnosis not present

## 2023-11-23 DIAGNOSIS — M25551 Pain in right hip: Secondary | ICD-10-CM | POA: Diagnosis not present

## 2023-11-23 DIAGNOSIS — M6259 Muscle wasting and atrophy, not elsewhere classified, multiple sites: Secondary | ICD-10-CM | POA: Diagnosis not present

## 2023-11-23 DIAGNOSIS — R2681 Unsteadiness on feet: Secondary | ICD-10-CM | POA: Diagnosis not present

## 2023-11-23 DIAGNOSIS — Z96641 Presence of right artificial hip joint: Secondary | ICD-10-CM | POA: Diagnosis not present

## 2023-11-23 DIAGNOSIS — R2689 Other abnormalities of gait and mobility: Secondary | ICD-10-CM | POA: Diagnosis not present

## 2023-11-27 DIAGNOSIS — M1611 Unilateral primary osteoarthritis, right hip: Secondary | ICD-10-CM | POA: Diagnosis not present

## 2023-11-27 DIAGNOSIS — M25551 Pain in right hip: Secondary | ICD-10-CM | POA: Diagnosis not present

## 2023-11-27 DIAGNOSIS — R2681 Unsteadiness on feet: Secondary | ICD-10-CM | POA: Diagnosis not present

## 2023-11-27 DIAGNOSIS — M6259 Muscle wasting and atrophy, not elsewhere classified, multiple sites: Secondary | ICD-10-CM | POA: Diagnosis not present

## 2023-11-27 DIAGNOSIS — Z96641 Presence of right artificial hip joint: Secondary | ICD-10-CM | POA: Diagnosis not present

## 2023-11-27 DIAGNOSIS — R2689 Other abnormalities of gait and mobility: Secondary | ICD-10-CM | POA: Diagnosis not present

## 2023-11-29 DIAGNOSIS — E1129 Type 2 diabetes mellitus with other diabetic kidney complication: Secondary | ICD-10-CM | POA: Diagnosis not present

## 2023-11-29 DIAGNOSIS — M79675 Pain in left toe(s): Secondary | ICD-10-CM | POA: Diagnosis not present

## 2023-11-29 DIAGNOSIS — B351 Tinea unguium: Secondary | ICD-10-CM | POA: Diagnosis not present

## 2023-11-29 DIAGNOSIS — M2021 Hallux rigidus, right foot: Secondary | ICD-10-CM | POA: Diagnosis not present

## 2023-11-30 DIAGNOSIS — M6259 Muscle wasting and atrophy, not elsewhere classified, multiple sites: Secondary | ICD-10-CM | POA: Diagnosis not present

## 2023-11-30 DIAGNOSIS — Z96641 Presence of right artificial hip joint: Secondary | ICD-10-CM | POA: Diagnosis not present

## 2023-11-30 DIAGNOSIS — R2681 Unsteadiness on feet: Secondary | ICD-10-CM | POA: Diagnosis not present

## 2023-11-30 DIAGNOSIS — M25551 Pain in right hip: Secondary | ICD-10-CM | POA: Diagnosis not present

## 2023-11-30 DIAGNOSIS — M1611 Unilateral primary osteoarthritis, right hip: Secondary | ICD-10-CM | POA: Diagnosis not present

## 2023-11-30 DIAGNOSIS — R2689 Other abnormalities of gait and mobility: Secondary | ICD-10-CM | POA: Diagnosis not present

## 2023-12-03 DIAGNOSIS — M6259 Muscle wasting and atrophy, not elsewhere classified, multiple sites: Secondary | ICD-10-CM | POA: Diagnosis not present

## 2023-12-03 DIAGNOSIS — R2689 Other abnormalities of gait and mobility: Secondary | ICD-10-CM | POA: Diagnosis not present

## 2023-12-03 DIAGNOSIS — Z96641 Presence of right artificial hip joint: Secondary | ICD-10-CM | POA: Diagnosis not present

## 2023-12-03 DIAGNOSIS — M25551 Pain in right hip: Secondary | ICD-10-CM | POA: Diagnosis not present

## 2023-12-03 DIAGNOSIS — R2681 Unsteadiness on feet: Secondary | ICD-10-CM | POA: Diagnosis not present

## 2023-12-03 DIAGNOSIS — M1611 Unilateral primary osteoarthritis, right hip: Secondary | ICD-10-CM | POA: Diagnosis not present

## 2023-12-06 DIAGNOSIS — R2689 Other abnormalities of gait and mobility: Secondary | ICD-10-CM | POA: Diagnosis not present

## 2023-12-06 DIAGNOSIS — Z96641 Presence of right artificial hip joint: Secondary | ICD-10-CM | POA: Diagnosis not present

## 2023-12-06 DIAGNOSIS — M6259 Muscle wasting and atrophy, not elsewhere classified, multiple sites: Secondary | ICD-10-CM | POA: Diagnosis not present

## 2023-12-06 DIAGNOSIS — R2681 Unsteadiness on feet: Secondary | ICD-10-CM | POA: Diagnosis not present

## 2023-12-06 DIAGNOSIS — M25551 Pain in right hip: Secondary | ICD-10-CM | POA: Diagnosis not present

## 2023-12-06 DIAGNOSIS — M1611 Unilateral primary osteoarthritis, right hip: Secondary | ICD-10-CM | POA: Diagnosis not present

## 2023-12-13 DIAGNOSIS — M25551 Pain in right hip: Secondary | ICD-10-CM | POA: Diagnosis not present

## 2023-12-13 DIAGNOSIS — Z96641 Presence of right artificial hip joint: Secondary | ICD-10-CM | POA: Diagnosis not present

## 2023-12-13 DIAGNOSIS — M6259 Muscle wasting and atrophy, not elsewhere classified, multiple sites: Secondary | ICD-10-CM | POA: Diagnosis not present

## 2023-12-13 DIAGNOSIS — R2689 Other abnormalities of gait and mobility: Secondary | ICD-10-CM | POA: Diagnosis not present

## 2023-12-13 DIAGNOSIS — R2681 Unsteadiness on feet: Secondary | ICD-10-CM | POA: Diagnosis not present

## 2023-12-13 DIAGNOSIS — M1611 Unilateral primary osteoarthritis, right hip: Secondary | ICD-10-CM | POA: Diagnosis not present

## 2023-12-20 ENCOUNTER — Other Ambulatory Visit: Payer: Self-pay | Admitting: Psychiatry

## 2023-12-20 DIAGNOSIS — M25551 Pain in right hip: Secondary | ICD-10-CM | POA: Diagnosis not present

## 2023-12-20 DIAGNOSIS — R2681 Unsteadiness on feet: Secondary | ICD-10-CM | POA: Diagnosis not present

## 2023-12-20 DIAGNOSIS — F339 Major depressive disorder, recurrent, unspecified: Secondary | ICD-10-CM | POA: Diagnosis not present

## 2023-12-20 DIAGNOSIS — R2689 Other abnormalities of gait and mobility: Secondary | ICD-10-CM | POA: Diagnosis not present

## 2023-12-20 DIAGNOSIS — M1611 Unilateral primary osteoarthritis, right hip: Secondary | ICD-10-CM | POA: Diagnosis not present

## 2023-12-20 DIAGNOSIS — M6259 Muscle wasting and atrophy, not elsewhere classified, multiple sites: Secondary | ICD-10-CM | POA: Diagnosis not present

## 2023-12-20 DIAGNOSIS — Z96641 Presence of right artificial hip joint: Secondary | ICD-10-CM | POA: Diagnosis not present

## 2023-12-22 LAB — LITHIUM LEVEL: Lithium Lvl: 0.5 mmol/L — ABNORMAL LOW (ref 0.6–1.2)

## 2023-12-22 LAB — NORTRIPTYLINE LEVEL: Nortriptyline Lvl: 47 ug/L — ABNORMAL LOW (ref 50–150)

## 2023-12-24 ENCOUNTER — Ambulatory Visit: Payer: Self-pay | Admitting: Psychiatry

## 2023-12-25 DIAGNOSIS — Z96641 Presence of right artificial hip joint: Secondary | ICD-10-CM | POA: Diagnosis not present

## 2023-12-25 DIAGNOSIS — M1611 Unilateral primary osteoarthritis, right hip: Secondary | ICD-10-CM | POA: Diagnosis not present

## 2023-12-25 DIAGNOSIS — Z09 Encounter for follow-up examination after completed treatment for conditions other than malignant neoplasm: Secondary | ICD-10-CM | POA: Diagnosis not present

## 2023-12-26 DIAGNOSIS — M25551 Pain in right hip: Secondary | ICD-10-CM | POA: Diagnosis not present

## 2023-12-26 DIAGNOSIS — M6259 Muscle wasting and atrophy, not elsewhere classified, multiple sites: Secondary | ICD-10-CM | POA: Diagnosis not present

## 2023-12-26 DIAGNOSIS — M1611 Unilateral primary osteoarthritis, right hip: Secondary | ICD-10-CM | POA: Diagnosis not present

## 2023-12-26 DIAGNOSIS — R2689 Other abnormalities of gait and mobility: Secondary | ICD-10-CM | POA: Diagnosis not present

## 2023-12-26 DIAGNOSIS — R2681 Unsteadiness on feet: Secondary | ICD-10-CM | POA: Diagnosis not present

## 2023-12-26 DIAGNOSIS — Z96641 Presence of right artificial hip joint: Secondary | ICD-10-CM | POA: Diagnosis not present

## 2023-12-28 DIAGNOSIS — M6259 Muscle wasting and atrophy, not elsewhere classified, multiple sites: Secondary | ICD-10-CM | POA: Diagnosis not present

## 2023-12-28 DIAGNOSIS — M1611 Unilateral primary osteoarthritis, right hip: Secondary | ICD-10-CM | POA: Diagnosis not present

## 2023-12-28 DIAGNOSIS — R2681 Unsteadiness on feet: Secondary | ICD-10-CM | POA: Diagnosis not present

## 2023-12-28 DIAGNOSIS — Z96641 Presence of right artificial hip joint: Secondary | ICD-10-CM | POA: Diagnosis not present

## 2023-12-28 DIAGNOSIS — M25551 Pain in right hip: Secondary | ICD-10-CM | POA: Diagnosis not present

## 2023-12-28 DIAGNOSIS — R2689 Other abnormalities of gait and mobility: Secondary | ICD-10-CM | POA: Diagnosis not present

## 2024-01-01 ENCOUNTER — Telehealth: Payer: Self-pay | Admitting: Psychiatry

## 2024-01-01 NOTE — Telephone Encounter (Signed)
 Pt called at 3:38p stating he was notified that his bloodwork came back and the Lithium  and Nortriptyline  levels were low.  He said he feels fine, but wanted Dr Geoffry to look at the results and let him know if he needs to do something else.  Next appt 9/25

## 2024-01-01 NOTE — Telephone Encounter (Signed)
 Patient's labs:    6/26 lithium  level 0.5 on 450 mg; nortriptyline  47 on 100 mg. Tried contacting pt to confirm what dose he is taking and had to LVM.

## 2024-01-01 NOTE — Telephone Encounter (Signed)
 Both of those levels are at the low end of normal but they are normal.  He feels fine so no changes needed.  Thanks for getting the levels.

## 2024-01-02 NOTE — Telephone Encounter (Signed)
 Called patient again and goes straight to VM. Left VM yesterday.  Sent response via MyChart.

## 2024-01-03 ENCOUNTER — Ambulatory Visit: Payer: Medicare Other | Admitting: Neurology

## 2024-01-03 DIAGNOSIS — M6259 Muscle wasting and atrophy, not elsewhere classified, multiple sites: Secondary | ICD-10-CM | POA: Diagnosis not present

## 2024-01-03 DIAGNOSIS — M1611 Unilateral primary osteoarthritis, right hip: Secondary | ICD-10-CM | POA: Diagnosis not present

## 2024-01-03 DIAGNOSIS — R2689 Other abnormalities of gait and mobility: Secondary | ICD-10-CM | POA: Diagnosis not present

## 2024-01-03 DIAGNOSIS — R2681 Unsteadiness on feet: Secondary | ICD-10-CM | POA: Diagnosis not present

## 2024-01-03 DIAGNOSIS — M25551 Pain in right hip: Secondary | ICD-10-CM | POA: Diagnosis not present

## 2024-01-03 DIAGNOSIS — Z96641 Presence of right artificial hip joint: Secondary | ICD-10-CM | POA: Diagnosis not present

## 2024-01-14 DIAGNOSIS — Z8673 Personal history of transient ischemic attack (TIA), and cerebral infarction without residual deficits: Secondary | ICD-10-CM | POA: Diagnosis not present

## 2024-01-14 DIAGNOSIS — R635 Abnormal weight gain: Secondary | ICD-10-CM | POA: Diagnosis not present

## 2024-01-14 DIAGNOSIS — I10 Essential (primary) hypertension: Secondary | ICD-10-CM | POA: Diagnosis not present

## 2024-01-14 DIAGNOSIS — M79674 Pain in right toe(s): Secondary | ICD-10-CM | POA: Diagnosis not present

## 2024-02-11 ENCOUNTER — Other Ambulatory Visit: Payer: Self-pay | Admitting: Psychiatry

## 2024-02-11 DIAGNOSIS — F339 Major depressive disorder, recurrent, unspecified: Secondary | ICD-10-CM

## 2024-02-13 DIAGNOSIS — N1831 Chronic kidney disease, stage 3a: Secondary | ICD-10-CM | POA: Diagnosis not present

## 2024-02-13 DIAGNOSIS — E7800 Pure hypercholesterolemia, unspecified: Secondary | ICD-10-CM | POA: Diagnosis not present

## 2024-02-13 DIAGNOSIS — I129 Hypertensive chronic kidney disease with stage 1 through stage 4 chronic kidney disease, or unspecified chronic kidney disease: Secondary | ICD-10-CM | POA: Diagnosis not present

## 2024-02-13 DIAGNOSIS — E538 Deficiency of other specified B group vitamins: Secondary | ICD-10-CM | POA: Diagnosis not present

## 2024-02-13 DIAGNOSIS — E1169 Type 2 diabetes mellitus with other specified complication: Secondary | ICD-10-CM | POA: Diagnosis not present

## 2024-02-13 DIAGNOSIS — Z79899 Other long term (current) drug therapy: Secondary | ICD-10-CM | POA: Diagnosis not present

## 2024-02-13 DIAGNOSIS — G629 Polyneuropathy, unspecified: Secondary | ICD-10-CM | POA: Diagnosis not present

## 2024-02-13 DIAGNOSIS — F325 Major depressive disorder, single episode, in full remission: Secondary | ICD-10-CM | POA: Diagnosis not present

## 2024-02-13 DIAGNOSIS — Z8739 Personal history of other diseases of the musculoskeletal system and connective tissue: Secondary | ICD-10-CM | POA: Diagnosis not present

## 2024-02-13 DIAGNOSIS — R269 Unspecified abnormalities of gait and mobility: Secondary | ICD-10-CM | POA: Diagnosis not present

## 2024-02-13 DIAGNOSIS — Z8673 Personal history of transient ischemic attack (TIA), and cerebral infarction without residual deficits: Secondary | ICD-10-CM | POA: Diagnosis not present

## 2024-02-13 DIAGNOSIS — Z0000 Encounter for general adult medical examination without abnormal findings: Secondary | ICD-10-CM | POA: Diagnosis not present

## 2024-02-26 ENCOUNTER — Other Ambulatory Visit: Payer: Self-pay | Admitting: Psychiatry

## 2024-02-26 DIAGNOSIS — F339 Major depressive disorder, recurrent, unspecified: Secondary | ICD-10-CM

## 2024-03-20 ENCOUNTER — Ambulatory Visit (INDEPENDENT_AMBULATORY_CARE_PROVIDER_SITE_OTHER): Admitting: Psychiatry

## 2024-03-20 ENCOUNTER — Encounter: Payer: Self-pay | Admitting: Psychiatry

## 2024-03-20 DIAGNOSIS — G251 Drug-induced tremor: Secondary | ICD-10-CM | POA: Diagnosis not present

## 2024-03-20 DIAGNOSIS — F339 Major depressive disorder, recurrent, unspecified: Secondary | ICD-10-CM | POA: Diagnosis not present

## 2024-03-20 DIAGNOSIS — F5105 Insomnia due to other mental disorder: Secondary | ICD-10-CM | POA: Diagnosis not present

## 2024-03-20 DIAGNOSIS — E538 Deficiency of other specified B group vitamins: Secondary | ICD-10-CM
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
## 2024-03-24 DIAGNOSIS — Z23 Encounter for immunization: Secondary | ICD-10-CM | POA: Diagnosis not present

## 2024-03-26 DIAGNOSIS — Z8739 Personal history of other diseases of the musculoskeletal system and connective tissue: Secondary | ICD-10-CM | POA: Diagnosis not present

## 2024-04-08 ENCOUNTER — Other Ambulatory Visit: Payer: Self-pay | Admitting: Psychiatry

## 2024-04-08 DIAGNOSIS — F339 Major depressive disorder, recurrent, unspecified: Secondary | ICD-10-CM

## 2024-04-09 DIAGNOSIS — J069 Acute upper respiratory infection, unspecified: Secondary | ICD-10-CM | POA: Diagnosis not present

## 2024-04-17 DIAGNOSIS — R059 Cough, unspecified: Secondary | ICD-10-CM | POA: Diagnosis not present

## 2024-06-10 ENCOUNTER — Other Ambulatory Visit: Payer: Self-pay | Admitting: Psychiatry

## 2024-06-10 DIAGNOSIS — G251 Drug-induced tremor: Secondary | ICD-10-CM

## 2024-06-10 DIAGNOSIS — F339 Major depressive disorder, recurrent, unspecified: Secondary | ICD-10-CM

## 2024-06-10 DIAGNOSIS — Z79899 Other long term (current) drug therapy: Secondary | ICD-10-CM

## 2024-06-10 NOTE — Telephone Encounter (Signed)
 Please call pt to schedule his appt for Jan.   Thank you

## 2024-06-13 NOTE — Telephone Encounter (Signed)
Apt. 2/3

## 2024-06-24 ENCOUNTER — Other Ambulatory Visit: Payer: Self-pay | Admitting: Psychiatry

## 2024-06-24 DIAGNOSIS — F339 Major depressive disorder, recurrent, unspecified: Secondary | ICD-10-CM

## 2024-07-10 ENCOUNTER — Other Ambulatory Visit: Payer: Self-pay | Admitting: *Deleted

## 2024-07-10 DIAGNOSIS — I6521 Occlusion and stenosis of right carotid artery: Secondary | ICD-10-CM

## 2024-07-29 ENCOUNTER — Ambulatory Visit: Admitting: Psychiatry

## 2024-07-31 ENCOUNTER — Ambulatory Visit (HOSPITAL_COMMUNITY)
Admission: RE | Admit: 2024-07-31 | Discharge: 2024-07-31 | Disposition: A | Source: Ambulatory Visit | Attending: Surgery | Admitting: Surgery

## 2024-07-31 ENCOUNTER — Ambulatory Visit: Admitting: Vascular Surgery

## 2024-07-31 DIAGNOSIS — I6521 Occlusion and stenosis of right carotid artery: Secondary | ICD-10-CM

## 2024-08-07 ENCOUNTER — Ambulatory Visit: Admitting: Vascular Surgery

## 2024-09-17 ENCOUNTER — Ambulatory Visit: Admitting: Psychiatry

## 2024-09-18 ENCOUNTER — Ambulatory Visit: Admitting: Psychiatry

## 2024-10-09 ENCOUNTER — Ambulatory Visit: Admitting: Neurology
